# Patient Record
Sex: Male | Born: 1935 | Race: White | Hispanic: No | Marital: Married | Smoking: Never smoker
Health system: Southern US, Community
[De-identification: ages and names within clinical notes are randomized; demographics above are authoritative.]

## PROBLEM LIST (undated history)

## (undated) DIAGNOSIS — I701 Atherosclerosis of renal artery: Secondary | ICD-10-CM

## (undated) DIAGNOSIS — Z9889 Other specified postprocedural states: Secondary | ICD-10-CM

## (undated) DIAGNOSIS — Z951 Presence of aortocoronary bypass graft: Secondary | ICD-10-CM

## (undated) DIAGNOSIS — I359 Nonrheumatic aortic valve disorder, unspecified: Secondary | ICD-10-CM

## (undated) DIAGNOSIS — G4730 Sleep apnea, unspecified: Secondary | ICD-10-CM

## (undated) DIAGNOSIS — I739 Peripheral vascular disease, unspecified: Secondary | ICD-10-CM

## (undated) DIAGNOSIS — E785 Hyperlipidemia, unspecified: Secondary | ICD-10-CM

## (undated) DIAGNOSIS — I252 Old myocardial infarction: Secondary | ICD-10-CM

## (undated) DIAGNOSIS — D0359 Melanoma in situ of other part of trunk: Secondary | ICD-10-CM

## (undated) DIAGNOSIS — I679 Cerebrovascular disease, unspecified: Secondary | ICD-10-CM

## (undated) DIAGNOSIS — I779 Disorder of arteries and arterioles, unspecified: Secondary | ICD-10-CM

## (undated) DIAGNOSIS — Z9289 Personal history of other medical treatment: Secondary | ICD-10-CM

## (undated) DIAGNOSIS — Z9861 Coronary angioplasty status: Secondary | ICD-10-CM

## (undated) DIAGNOSIS — N400 Enlarged prostate without lower urinary tract symptoms: Secondary | ICD-10-CM

## (undated) DIAGNOSIS — A389 Scarlet fever, uncomplicated: Secondary | ICD-10-CM

## (undated) DIAGNOSIS — C679 Malignant neoplasm of bladder, unspecified: Secondary | ICD-10-CM

## (undated) DIAGNOSIS — I2510 Atherosclerotic heart disease of native coronary artery without angina pectoris: Secondary | ICD-10-CM

## (undated) HISTORY — DX: Atherosclerosis of renal artery: I70.1

## (undated) HISTORY — DX: Nonrheumatic aortic valve disorder, unspecified: I35.9

## (undated) HISTORY — DX: Hyperlipidemia, unspecified: E78.5

## (undated) HISTORY — DX: Coronary angioplasty status: Z98.61

## (undated) HISTORY — DX: Peripheral vascular disease, unspecified: I73.9

## (undated) HISTORY — DX: Melanoma in situ of other part of trunk: D03.59

## (undated) HISTORY — PX: CYSTOSCOPY: SHX5120

## (undated) HISTORY — PX: OTHER SURGICAL HISTORY: SHX169

## (undated) HISTORY — DX: Cerebrovascular disease, unspecified: I67.9

## (undated) HISTORY — DX: Disorder of arteries and arterioles, unspecified: I77.9

## (undated) HISTORY — DX: Sleep apnea, unspecified: G47.30

## (undated) HISTORY — PX: CORONARY ARTERY BYPASS GRAFT: SHX141

## (undated) HISTORY — DX: Personal history of other medical treatment: Z92.89

## (undated) HISTORY — DX: Presence of aortocoronary bypass graft: Z95.1

## (undated) HISTORY — DX: Other specified postprocedural states: Z98.890

## (undated) HISTORY — DX: Old myocardial infarction: I25.2

## (undated) HISTORY — DX: Atherosclerotic heart disease of native coronary artery without angina pectoris: I25.10

## (undated) HISTORY — DX: Benign prostatic hyperplasia without lower urinary tract symptoms: N40.0

## (undated) HISTORY — DX: Scarlet fever, uncomplicated: A38.9

## (undated) HISTORY — DX: Malignant neoplasm of bladder, unspecified: C67.9

---

## 2000-10-30 ENCOUNTER — Encounter: Payer: Self-pay | Admitting: Urology

## 2000-10-31 ENCOUNTER — Encounter (INDEPENDENT_AMBULATORY_CARE_PROVIDER_SITE_OTHER): Payer: Self-pay | Admitting: Specialist

## 2000-10-31 ENCOUNTER — Ambulatory Visit (HOSPITAL_COMMUNITY): Admission: RE | Admit: 2000-10-31 | Discharge: 2000-10-31 | Payer: Self-pay | Admitting: Urology

## 2001-03-06 ENCOUNTER — Ambulatory Visit (HOSPITAL_COMMUNITY): Admission: RE | Admit: 2001-03-06 | Discharge: 2001-03-06 | Payer: Self-pay | Admitting: Urology

## 2001-03-06 ENCOUNTER — Encounter (INDEPENDENT_AMBULATORY_CARE_PROVIDER_SITE_OTHER): Payer: Self-pay | Admitting: Specialist

## 2001-07-23 ENCOUNTER — Ambulatory Visit (HOSPITAL_COMMUNITY): Admission: RE | Admit: 2001-07-23 | Discharge: 2001-07-23 | Payer: Self-pay | Admitting: Gastroenterology

## 2001-07-23 ENCOUNTER — Encounter (INDEPENDENT_AMBULATORY_CARE_PROVIDER_SITE_OTHER): Payer: Self-pay | Admitting: Specialist

## 2003-07-03 ENCOUNTER — Encounter: Admission: RE | Admit: 2003-07-03 | Discharge: 2003-07-03 | Payer: Self-pay | Admitting: Cardiology

## 2003-07-28 ENCOUNTER — Ambulatory Visit (HOSPITAL_COMMUNITY): Admission: RE | Admit: 2003-07-28 | Discharge: 2003-07-28 | Payer: Self-pay | Admitting: Cardiology

## 2003-08-05 ENCOUNTER — Ambulatory Visit (HOSPITAL_COMMUNITY): Admission: RE | Admit: 2003-08-05 | Discharge: 2003-08-06 | Payer: Self-pay | Admitting: Cardiology

## 2004-05-26 ENCOUNTER — Ambulatory Visit: Payer: Self-pay | Admitting: Internal Medicine

## 2004-07-15 ENCOUNTER — Ambulatory Visit: Payer: Self-pay | Admitting: Internal Medicine

## 2004-07-20 ENCOUNTER — Ambulatory Visit: Payer: Self-pay | Admitting: Internal Medicine

## 2004-08-08 ENCOUNTER — Ambulatory Visit: Payer: Self-pay

## 2004-08-09 ENCOUNTER — Ambulatory Visit: Payer: Self-pay | Admitting: Cardiology

## 2005-01-18 ENCOUNTER — Ambulatory Visit: Payer: Self-pay | Admitting: Cardiology

## 2005-02-06 ENCOUNTER — Ambulatory Visit: Payer: Self-pay

## 2005-02-06 ENCOUNTER — Ambulatory Visit: Payer: Self-pay | Admitting: Cardiology

## 2005-02-07 ENCOUNTER — Ambulatory Visit: Payer: Self-pay | Admitting: Internal Medicine

## 2005-05-02 ENCOUNTER — Ambulatory Visit: Payer: Self-pay | Admitting: Internal Medicine

## 2005-05-04 ENCOUNTER — Ambulatory Visit: Payer: Self-pay | Admitting: Cardiology

## 2005-08-28 ENCOUNTER — Ambulatory Visit: Payer: Self-pay | Admitting: Internal Medicine

## 2005-09-04 ENCOUNTER — Ambulatory Visit: Payer: Self-pay | Admitting: Internal Medicine

## 2005-09-08 ENCOUNTER — Ambulatory Visit: Payer: Self-pay

## 2005-09-25 ENCOUNTER — Ambulatory Visit: Payer: Self-pay | Admitting: Internal Medicine

## 2005-10-24 ENCOUNTER — Ambulatory Visit: Payer: Self-pay | Admitting: Cardiology

## 2005-11-10 ENCOUNTER — Ambulatory Visit: Payer: Self-pay

## 2006-05-02 ENCOUNTER — Ambulatory Visit: Payer: Self-pay | Admitting: Internal Medicine

## 2006-05-24 ENCOUNTER — Ambulatory Visit: Payer: Self-pay

## 2006-09-11 ENCOUNTER — Ambulatory Visit: Payer: Self-pay | Admitting: Internal Medicine

## 2006-09-11 LAB — CONVERTED CEMR LAB
AST: 20 units/L (ref 0–37)
Cholesterol: 131 mg/dL (ref 0–200)
HDL: 46.1 mg/dL (ref >39.0)
LDL Cholesterol: 68 mg/dL (ref 0–99)
PSA: 2.18 ng/mL (ref 0.10–4.00)
Total CHOL/HDL Ratio: 2.8
VLDL: 17 mg/dL (ref 0–40)

## 2006-10-18 ENCOUNTER — Ambulatory Visit (HOSPITAL_BASED_OUTPATIENT_CLINIC_OR_DEPARTMENT_OTHER): Admission: RE | Admit: 2006-10-18 | Discharge: 2006-10-18 | Payer: Self-pay | Admitting: Internal Medicine

## 2006-10-26 ENCOUNTER — Ambulatory Visit: Payer: Self-pay | Admitting: Cardiology

## 2006-11-02 ENCOUNTER — Ambulatory Visit: Payer: Self-pay | Admitting: Pulmonary Disease

## 2006-11-07 ENCOUNTER — Ambulatory Visit: Payer: Self-pay

## 2006-12-05 ENCOUNTER — Ambulatory Visit: Payer: Self-pay | Admitting: Pulmonary Disease

## 2006-12-18 ENCOUNTER — Ambulatory Visit: Payer: Self-pay | Admitting: Pulmonary Disease

## 2006-12-18 ENCOUNTER — Ambulatory Visit (HOSPITAL_BASED_OUTPATIENT_CLINIC_OR_DEPARTMENT_OTHER): Admission: RE | Admit: 2006-12-18 | Discharge: 2006-12-18 | Payer: Self-pay | Admitting: Pulmonary Disease

## 2007-01-02 ENCOUNTER — Ambulatory Visit: Payer: Self-pay | Admitting: Internal Medicine

## 2007-03-26 ENCOUNTER — Ambulatory Visit: Payer: Self-pay | Admitting: Pulmonary Disease

## 2007-04-30 ENCOUNTER — Ambulatory Visit: Payer: Self-pay | Admitting: Internal Medicine

## 2007-05-15 ENCOUNTER — Ambulatory Visit: Payer: Self-pay

## 2007-06-19 ENCOUNTER — Ambulatory Visit: Payer: Self-pay | Admitting: Cardiology

## 2007-06-19 ENCOUNTER — Inpatient Hospital Stay (HOSPITAL_COMMUNITY): Admission: EM | Admit: 2007-06-19 | Discharge: 2007-06-22 | Payer: Self-pay | Admitting: Emergency Medicine

## 2007-07-02 ENCOUNTER — Ambulatory Visit: Payer: Self-pay

## 2007-07-02 ENCOUNTER — Encounter: Payer: Self-pay | Admitting: Cardiology

## 2007-07-08 ENCOUNTER — Ambulatory Visit: Payer: Self-pay | Admitting: Cardiology

## 2007-07-16 ENCOUNTER — Ambulatory Visit: Payer: Self-pay | Admitting: Cardiology

## 2007-07-30 ENCOUNTER — Ambulatory Visit: Payer: Self-pay | Admitting: Cardiology

## 2007-07-30 LAB — CONVERTED CEMR LAB
Basophils Relative: 0.1 % (ref 0.0–1.0)
Eosinophils Absolute: 0.1 10*3/uL (ref 0.0–0.6)
Eosinophils Relative: 2 % (ref 0.0–5.0)
Hemoglobin: 13.1 g/dL (ref 13.0–17.0)
Lymphocytes Relative: 23.9 % (ref 12.0–46.0)
MCV: 89.6 fL (ref 78.0–100.0)
Monocytes Absolute: 0.7 10*3/uL (ref 0.2–0.7)
Neutro Abs: 3.3 10*3/uL (ref 1.4–7.7)
WBC: 5.4 10*3/uL (ref 4.5–10.5)

## 2007-08-27 ENCOUNTER — Encounter: Payer: Self-pay | Admitting: *Deleted

## 2007-08-27 DIAGNOSIS — I679 Cerebrovascular disease, unspecified: Secondary | ICD-10-CM | POA: Insufficient documentation

## 2007-08-27 DIAGNOSIS — Z951 Presence of aortocoronary bypass graft: Secondary | ICD-10-CM | POA: Insufficient documentation

## 2007-08-27 DIAGNOSIS — I252 Old myocardial infarction: Secondary | ICD-10-CM | POA: Insufficient documentation

## 2007-08-27 DIAGNOSIS — I739 Peripheral vascular disease, unspecified: Secondary | ICD-10-CM | POA: Insufficient documentation

## 2007-08-27 DIAGNOSIS — E785 Hyperlipidemia, unspecified: Secondary | ICD-10-CM | POA: Insufficient documentation

## 2007-08-27 DIAGNOSIS — I25709 Atherosclerosis of coronary artery bypass graft(s), unspecified, with unspecified angina pectoris: Secondary | ICD-10-CM | POA: Insufficient documentation

## 2007-08-27 DIAGNOSIS — G4730 Sleep apnea, unspecified: Secondary | ICD-10-CM | POA: Insufficient documentation

## 2007-08-27 DIAGNOSIS — N400 Enlarged prostate without lower urinary tract symptoms: Secondary | ICD-10-CM | POA: Insufficient documentation

## 2007-08-27 DIAGNOSIS — C679 Malignant neoplasm of bladder, unspecified: Secondary | ICD-10-CM | POA: Insufficient documentation

## 2007-08-27 DIAGNOSIS — I2510 Atherosclerotic heart disease of native coronary artery without angina pectoris: Secondary | ICD-10-CM | POA: Insufficient documentation

## 2007-08-27 DIAGNOSIS — I701 Atherosclerosis of renal artery: Secondary | ICD-10-CM | POA: Insufficient documentation

## 2007-08-27 DIAGNOSIS — Z9861 Coronary angioplasty status: Secondary | ICD-10-CM | POA: Insufficient documentation

## 2007-08-27 DIAGNOSIS — Z87898 Personal history of other specified conditions: Secondary | ICD-10-CM | POA: Insufficient documentation

## 2007-08-27 DIAGNOSIS — A389 Scarlet fever, uncomplicated: Secondary | ICD-10-CM | POA: Insufficient documentation

## 2007-10-09 ENCOUNTER — Ambulatory Visit: Payer: Self-pay | Admitting: Cardiology

## 2007-10-22 ENCOUNTER — Ambulatory Visit: Payer: Self-pay | Admitting: Cardiology

## 2007-11-26 ENCOUNTER — Ambulatory Visit: Payer: Self-pay | Admitting: Internal Medicine

## 2008-02-06 ENCOUNTER — Ambulatory Visit: Payer: Self-pay | Admitting: Cardiology

## 2008-05-04 ENCOUNTER — Ambulatory Visit: Payer: Self-pay | Admitting: Internal Medicine

## 2008-05-12 ENCOUNTER — Ambulatory Visit: Payer: Self-pay | Admitting: Cardiology

## 2008-05-14 ENCOUNTER — Ambulatory Visit: Payer: Self-pay | Admitting: Cardiology

## 2008-06-09 ENCOUNTER — Ambulatory Visit: Payer: Self-pay | Admitting: Cardiology

## 2008-10-14 ENCOUNTER — Telehealth: Payer: Self-pay | Admitting: Internal Medicine

## 2008-10-15 ENCOUNTER — Ambulatory Visit: Payer: Self-pay | Admitting: Cardiology

## 2008-10-15 ENCOUNTER — Encounter: Payer: Self-pay | Admitting: Cardiology

## 2008-11-13 ENCOUNTER — Ambulatory Visit: Payer: Self-pay

## 2008-11-13 ENCOUNTER — Encounter: Payer: Self-pay | Admitting: Cardiology

## 2008-12-08 ENCOUNTER — Ambulatory Visit: Payer: Self-pay | Admitting: Cardiology

## 2008-12-11 DIAGNOSIS — I350 Nonrheumatic aortic (valve) stenosis: Secondary | ICD-10-CM | POA: Insufficient documentation

## 2008-12-21 ENCOUNTER — Encounter: Payer: Self-pay | Admitting: Cardiology

## 2009-01-05 ENCOUNTER — Ambulatory Visit: Payer: Self-pay | Admitting: Internal Medicine

## 2009-01-08 ENCOUNTER — Ambulatory Visit: Payer: Self-pay | Admitting: Internal Medicine

## 2009-01-08 LAB — CONVERTED CEMR LAB
ALT: 19 units/L (ref 0–53)
Albumin: 3.7 g/dL (ref 3.5–5.2)
BUN: 30 mg/dL — ABNORMAL HIGH (ref 6–23)
Basophils Relative: 0 % (ref 0.0–3.0)
Bilirubin, Direct: 0.2 mg/dL (ref 0.0–0.3)
Calcium: 9.2 mg/dL (ref 8.4–10.5)
Cholesterol: 101 mg/dL (ref 0–200)
Creatinine, Ser: 1.7 mg/dL — ABNORMAL HIGH (ref 0.4–1.5)
Eosinophils Relative: 1.2 % (ref 0.0–5.0)
GFR calc non Af Amer: 41.66 mL/min (ref >60)
Glucose, Bld: 101 mg/dL — ABNORMAL HIGH (ref 70–99)
HCT: 40.5 % (ref 39.0–52.0)
Hemoglobin: 13.8 g/dL (ref 13.0–17.0)
MCV: 91.4 fL (ref 78.0–100.0)
Monocytes Absolute: 0.5 10*3/uL (ref 0.1–1.0)
Neutro Abs: 3.2 10*3/uL (ref 1.4–7.7)
Neutrophils Relative %: 66.2 % (ref 43.0–77.0)
PSA: 1.65 ng/mL (ref 0.10–4.00)
RBC: 4.43 M/uL (ref 4.22–5.81)
Total Protein: 6.1 g/dL (ref 6.0–8.3)
WBC: 4.9 10*3/uL (ref 4.5–10.5)

## 2009-04-07 ENCOUNTER — Encounter: Payer: Self-pay | Admitting: Internal Medicine

## 2009-04-14 ENCOUNTER — Ambulatory Visit: Payer: Self-pay | Admitting: Internal Medicine

## 2009-05-18 ENCOUNTER — Encounter: Payer: Self-pay | Admitting: Cardiology

## 2009-05-19 ENCOUNTER — Encounter: Payer: Self-pay | Admitting: Cardiology

## 2009-05-19 ENCOUNTER — Ambulatory Visit: Payer: Self-pay

## 2009-06-15 ENCOUNTER — Ambulatory Visit: Payer: Self-pay | Admitting: Cardiology

## 2009-06-15 ENCOUNTER — Encounter: Payer: Self-pay | Admitting: Cardiology

## 2009-06-15 ENCOUNTER — Encounter: Payer: Self-pay | Admitting: Internal Medicine

## 2009-06-18 ENCOUNTER — Encounter (INDEPENDENT_AMBULATORY_CARE_PROVIDER_SITE_OTHER): Payer: Self-pay | Admitting: *Deleted

## 2009-08-03 ENCOUNTER — Telehealth (INDEPENDENT_AMBULATORY_CARE_PROVIDER_SITE_OTHER): Payer: Self-pay | Admitting: *Deleted

## 2009-08-10 ENCOUNTER — Encounter: Payer: Self-pay | Admitting: Cardiology

## 2009-08-11 ENCOUNTER — Encounter: Payer: Self-pay | Admitting: Cardiology

## 2009-08-11 ENCOUNTER — Ambulatory Visit: Payer: Self-pay | Admitting: Cardiology

## 2009-08-13 ENCOUNTER — Telehealth: Payer: Self-pay | Admitting: Cardiology

## 2009-08-17 ENCOUNTER — Encounter: Payer: Self-pay | Admitting: Cardiology

## 2009-10-12 ENCOUNTER — Encounter (INDEPENDENT_AMBULATORY_CARE_PROVIDER_SITE_OTHER): Payer: Self-pay | Admitting: *Deleted

## 2009-10-15 ENCOUNTER — Ambulatory Visit: Payer: Self-pay | Admitting: Cardiology

## 2009-10-15 DIAGNOSIS — I4510 Unspecified right bundle-branch block: Secondary | ICD-10-CM | POA: Insufficient documentation

## 2009-11-03 ENCOUNTER — Telehealth (INDEPENDENT_AMBULATORY_CARE_PROVIDER_SITE_OTHER): Payer: Self-pay

## 2009-11-04 ENCOUNTER — Ambulatory Visit (HOSPITAL_COMMUNITY): Admission: RE | Admit: 2009-11-04 | Discharge: 2009-11-04 | Payer: Self-pay | Admitting: Cardiology

## 2009-11-04 ENCOUNTER — Encounter: Payer: Self-pay | Admitting: Cardiology

## 2009-11-04 ENCOUNTER — Ambulatory Visit: Payer: Self-pay | Admitting: Cardiovascular Disease

## 2009-11-04 ENCOUNTER — Ambulatory Visit: Payer: Self-pay

## 2009-11-04 ENCOUNTER — Encounter (HOSPITAL_COMMUNITY): Admission: RE | Admit: 2009-11-04 | Discharge: 2010-01-18 | Payer: Self-pay | Admitting: Cardiology

## 2009-11-04 ENCOUNTER — Ambulatory Visit: Payer: Self-pay | Admitting: Internal Medicine

## 2009-11-11 ENCOUNTER — Ambulatory Visit: Payer: Self-pay | Admitting: Cardiology

## 2010-01-06 ENCOUNTER — Ambulatory Visit: Payer: Self-pay | Admitting: Internal Medicine

## 2010-03-09 ENCOUNTER — Ambulatory Visit: Payer: Self-pay | Admitting: Cardiology

## 2010-03-11 ENCOUNTER — Ambulatory Visit: Payer: Self-pay | Admitting: Cardiology

## 2010-03-11 LAB — CONVERTED CEMR LAB
Basophils Absolute: 0 10*3/uL (ref 0.0–0.1)
Calcium: 8.9 mg/dL (ref 8.4–10.5)
GFR calc non Af Amer: 46.21 mL/min (ref >60)
HCT: 37.3 % — ABNORMAL LOW (ref 39.0–52.0)
INR: 1 (ref 0.8–1.0)
Lymphs Abs: 1.2 10*3/uL (ref 0.7–4.0)
MCV: 91.5 fL (ref 78.0–100.0)
Monocytes Absolute: 0.5 10*3/uL (ref 0.1–1.0)
Monocytes Relative: 9.2 % (ref 3.0–12.0)
Platelets: 136 10*3/uL — ABNORMAL LOW (ref 150.0–400.0)
RDW: 14.8 % — ABNORMAL HIGH (ref 11.5–14.6)
Sodium: 144 meq/L (ref 135–145)
aPTT: 24.4 s (ref 21.7–28.8)

## 2010-03-14 ENCOUNTER — Ambulatory Visit: Payer: Self-pay | Admitting: Internal Medicine

## 2010-03-14 ENCOUNTER — Inpatient Hospital Stay (HOSPITAL_BASED_OUTPATIENT_CLINIC_OR_DEPARTMENT_OTHER): Admission: RE | Admit: 2010-03-14 | Discharge: 2010-03-14 | Payer: Self-pay | Admitting: Internal Medicine

## 2010-03-14 LAB — CONVERTED CEMR LAB
Calcium: 8.7 mg/dL (ref 8.4–10.5)
Creatinine, Ser: 1.6 mg/dL — ABNORMAL HIGH (ref 0.4–1.5)

## 2010-03-22 ENCOUNTER — Ambulatory Visit: Payer: Self-pay | Admitting: Surgery

## 2010-03-22 ENCOUNTER — Encounter: Payer: Self-pay | Admitting: Internal Medicine

## 2010-03-23 ENCOUNTER — Encounter: Payer: Self-pay | Admitting: Internal Medicine

## 2010-03-23 ENCOUNTER — Encounter: Payer: Self-pay | Admitting: Cardiology

## 2010-04-05 ENCOUNTER — Ambulatory Visit: Payer: Self-pay | Admitting: Cardiology

## 2010-04-07 ENCOUNTER — Ambulatory Visit: Payer: Self-pay | Admitting: Internal Medicine

## 2010-04-11 ENCOUNTER — Telehealth: Payer: Self-pay | Admitting: Cardiology

## 2010-04-14 ENCOUNTER — Ambulatory Visit: Payer: Self-pay | Admitting: Surgery

## 2010-04-14 ENCOUNTER — Ambulatory Visit: Admission: RE | Admit: 2010-04-14 | Discharge: 2010-04-14 | Payer: Self-pay | Admitting: Surgery

## 2010-04-14 ENCOUNTER — Encounter: Payer: Self-pay | Admitting: Surgery

## 2010-04-18 ENCOUNTER — Encounter: Payer: Self-pay | Admitting: Surgery

## 2010-04-18 ENCOUNTER — Ambulatory Visit: Payer: Self-pay | Admitting: Surgery

## 2010-04-18 ENCOUNTER — Inpatient Hospital Stay (HOSPITAL_COMMUNITY): Admission: RE | Admit: 2010-04-18 | Discharge: 2010-04-25 | Payer: Self-pay | Admitting: Surgery

## 2010-04-27 ENCOUNTER — Encounter: Payer: Self-pay | Admitting: Cardiology

## 2010-05-03 ENCOUNTER — Encounter: Admission: RE | Admit: 2010-05-03 | Discharge: 2010-05-03 | Payer: Self-pay | Admitting: Surgery

## 2010-05-03 ENCOUNTER — Ambulatory Visit: Payer: Self-pay | Admitting: Surgery

## 2010-05-17 ENCOUNTER — Ambulatory Visit: Payer: Self-pay | Admitting: Surgery

## 2010-05-17 ENCOUNTER — Encounter: Admission: RE | Admit: 2010-05-17 | Discharge: 2010-05-17 | Payer: Self-pay | Admitting: Surgery

## 2010-05-17 ENCOUNTER — Encounter: Payer: Self-pay | Admitting: Internal Medicine

## 2010-05-20 ENCOUNTER — Ambulatory Visit: Payer: Self-pay | Admitting: Cardiology

## 2010-06-08 ENCOUNTER — Ambulatory Visit: Payer: Self-pay | Admitting: Internal Medicine

## 2010-06-08 ENCOUNTER — Telehealth: Payer: Self-pay | Admitting: Internal Medicine

## 2010-06-08 DIAGNOSIS — M26609 Unspecified temporomandibular joint disorder, unspecified side: Secondary | ICD-10-CM | POA: Insufficient documentation

## 2010-06-16 ENCOUNTER — Encounter (HOSPITAL_COMMUNITY)
Admission: RE | Admit: 2010-06-16 | Discharge: 2010-08-16 | Payer: Self-pay | Source: Home / Self Care | Attending: Cardiology | Admitting: Cardiology

## 2010-06-20 ENCOUNTER — Encounter: Payer: Self-pay | Admitting: Cardiology

## 2010-06-22 ENCOUNTER — Encounter: Payer: Self-pay | Admitting: Internal Medicine

## 2010-06-27 ENCOUNTER — Telehealth (INDEPENDENT_AMBULATORY_CARE_PROVIDER_SITE_OTHER): Payer: Self-pay | Admitting: *Deleted

## 2010-06-29 ENCOUNTER — Encounter: Payer: Self-pay | Admitting: Cardiology

## 2010-07-01 ENCOUNTER — Telehealth: Payer: Self-pay | Admitting: Internal Medicine

## 2010-07-08 ENCOUNTER — Encounter: Payer: Self-pay | Admitting: Cardiology

## 2010-07-17 DIAGNOSIS — D0359 Melanoma in situ of other part of trunk: Secondary | ICD-10-CM

## 2010-07-17 HISTORY — DX: Melanoma in situ of other part of trunk: D03.59

## 2010-07-17 HISTORY — PX: MELANOMA EXCISION: SHX5266

## 2010-07-20 ENCOUNTER — Telehealth (INDEPENDENT_AMBULATORY_CARE_PROVIDER_SITE_OTHER): Payer: Self-pay | Admitting: *Deleted

## 2010-08-16 NOTE — Miscellaneous (Signed)
Summary: Request for Lipid Profile/[place]  Request for Lipid Profile/[place]   Imported By: Sherian Rein 06/24/2010 11:45:52  _____________________________________________________________________  External Attachment:    Type:   Image     Comment:   External Document

## 2010-08-16 NOTE — Letter (Signed)
Summary: [facility]  [facility]   Imported By: Lester Kildare 03/30/2010 07:19:21  _____________________________________________________________________  External Attachment:    Type:   Image     Comment:   External Document

## 2010-08-16 NOTE — Consult Note (Signed)
Summary: [facility] New Patient Consult   [facility] Office Visit   Imported By: Roderic Ovens 05/03/2010 11:14:58  _____________________________________________________________________  External Attachment:    Type:   Image     Comment:   External Document

## 2010-08-16 NOTE — Progress Notes (Signed)
   Walk in Patient Form Recieved " Insurance no longer paying for Vytorin, Pt left [facility] paper" forward to Wall/[facility]  August 03, 2009 3:04 PM

## 2010-08-16 NOTE — Miscellaneous (Signed)
Summary: [facility] Physician Order/Treatment Plan   [facility] Physician Order/Treatment Plan   Imported By: Roderic Ovens 05/16/2010 10:47:17  _____________________________________________________________________  External Attachment:    Type:   Image     Comment:   External Document

## 2010-08-16 NOTE — Letter (Signed)
Summary: Cardiac Catheterization Instructions- [facility]  [employer], Main Office  [address]   [place], [place] [address]   Phone: [phone]  Fax: [phone]     03/09/2010 MRN: [id number]  [name] [address] [place], [place]  [address]  Dear [name], [name] are scheduled for a Cardiac Catheterization on Monday August 29,2011 with Dr. Gala Romney. Please arrive to the 1st floor of the Heart and Vascular Center at [facility] at 8:30 am on the day of your procedure. Please do not arrive before 6:30 a.m. Call the Heart and Vascular Center at [phone] if you are unable to make your appointmnet. The Code to get into the parking garage under the building is 0002. Take the elevators to the 1st floor. You must have someone to drive you home. Someone must be with you for the first 24 hours after you arrive home. Please wear clothes that are easy to get on and off and wear slip-on shoes. Do not eat or drink after midnight except water with your medications that morning. Bring all your medications and current insurance cards with you.   __x_ Make sure you take your aspirin.  ___ You may take ALL of your medications with water that morning.   The usual length of stay after your procedure is 2 to 3 hours. This can vary.  If you have any questions, please call the office at the number listed above.   Dr. Karie Schwalbe. Wall/ Lisabeth Devoid RN

## 2010-08-16 NOTE — Medication Information (Signed)
Summary: Drug Quantity Limitations Request Form  Drug Quantity Limitations Request Form   Imported By: Roderic Ovens 08/13/2009 11:14:05  _____________________________________________________________________  External Attachment:    Type:   Image     Comment:   External Document

## 2010-08-16 NOTE — Cardiovascular Report (Signed)
Summary: Pre Cath Orders   Pre Cath Orders   Imported By: Roderic Ovens 03/14/2010 12:50:14  _____________________________________________________________________  External Attachment:    Type:   Image     Comment:   External Document

## 2010-08-16 NOTE — Progress Notes (Signed)
Summary: heart surgery is Monday   Phone Note Call from Patient Call back at Home Phone [phone]   Caller: pt Reason for Call: Talk to Nurse Summary of Call: pt is scheduled for heart surgery Monday Oct 3rd. Dr Daleen Squibb wanted them to call and let him know Initial call taken by: Faythe Ghee,  April 11, 2010 1:19 PM  Follow-up for Phone Call        I will forward to Dr. Daleen Squibb.  Mr. Reid wanted to make sure Dr. Daleen Squibb was aware of his surgical date with Dr. Laneta Simmers. Mylo Red RN     Appended Document: heart surgery is Monday ok. i WILL BE ON VACATION.

## 2010-08-16 NOTE — Assessment & Plan Note (Signed)
Summary: eph/ d/c on 10.10.11  Medications Added DOXAZOSIN MESYLATE 4 MG  TABS (DOXAZOSIN MESYLATE) 1/2 by mouth two times a day AMOXICILLIN 500 MG CAPS (AMOXICILLIN) take 4 capsules 1 hour before your dental procedure      Allergies Added:   Primary Provider:  Jacques Navy MD   History of Present Illness: [name] returns today after having aortic valve replacement with a pericardial tissue valve on October 3. He did not require repeat coronary bypass grafting. Left ventricular function prior to surgery was normal.  He did well postoperatively. He is followed with Dr. Laneta Delacruz who has released him.  He is anxious to start back in cardiac rehabilitation.  He will have dental surgery in January. At that time we will proceed with antibiotic prophylaxis for endocarditis until the valve endothelializes.  He denies orthopnea, PND, edema, productive cough, shortness of breath  Clinical Reports Reviewed:  Cardiac Cath:  03/14/2010: Cardiac Cath Findings:  Assessment: 1. Coronary artery disease with all grafts patent. 2. Normal Left ventricular function with a highly calcified aortic valve. 3. Moderate aortic stenosis by catherterization. Severe by echo. 4. Peripheral arterial disease with apparent high-grade right renal artery stenosis.  Ricky Meres, MD  06/20/2007: Cardiac Cath Findings:   ANGIOGRAPHIC DATA:  The vein graft prior to intervention demonstrated   90% stenosis and afterwards 0%.  No complications were noted.      DISPOSITION:  The patient will be treated with aspirin and Plavix,   continue to follow up with Dr. Daleen Delacruz with risk factor reduction.            Ricky Morton. Riley Kill, MD, [facility]   Electronically Signed     Cardiac Cath Findings:   Left ventriculogram done in the RAO position showed an ejection fraction   of 65-70% with no [name] motion abnormalities.  No significant mitral   regurgitation.      ASSESSMENT:   1. Three vessel native coronary artery  disease.   2. LIMA to the LAD is patent.  However, the distal LAD fills mainly       through the native vessel.   3. Saphenous vein graft to the diagonal is okay.  Saphenous vein graft       to the PDA with a questionable sequential graft to the posterior       lateral is okay.  However, it is unclear if the distal limb is       patent or not, or whether or not this is native vessel.   4. Saphenous vein graft to the OM has a ruptured plaque with a 95%       stenosis proximally.   5. Normal LV function.   6. Question of mild aortic stenosis on pullback across the aortic       valve.      PLAN:  Percutaneous intervention from the saphenous vein graft to the OM   with Dr. Riley Delacruz.  Would also check a 2D echocardiogram to evaluate the   aortic valve.               Ricky Buckles. Bensimhon, MD   Electronically Signed      08/25/1993: Cardiac Cath Findings:  Final Diagnosis: 1) Well preserved left ventricular function 2) Multivessel coronary artery disease.  Ricky Pons, MD  CXR:  06/19/2007: CXR Results:   Clinical Data:   Chest pain, hypertension.  Previous smoker.   PORTABLE CHEST - 06/19/2007:   Comparison:  07/28/2003   Findings:  Sternal wire sutures and mediastinal clips (CABG).  No CHF   or acute lung process. Cardiomediastinal silhouette appears   unremarkable.   IMPRESSION:   No acute chest findings.    Read By:  [name],  M.D.   Released By:  [name],  M.D.  07/28/2003: CXR Results:  DG Chest 1V (Same Day)  Clinical Data:  PVD, preoperative evaluation.  Previous chest x-ray   showed nodular densities at the bases.  Now for repeat with nipple   markers.   CHEST, ONE VIEW SAME DAY   The patient returned and a single view of the chest with nipple   markers was obtained.  The nodular densities over both lung bases do   correspond to the nipple shadows.   The patient is status-post CABG.  No focal opacities or effusions.   IMPRESSION   No  acute disease.  Previously described nodular densities do   represent nipple shadows.    Read By:  [name],  M.D.   Released By:  [name],  M.D. CXR Results:  DG Chest 2 View  Clinical Data:    Preop respiratory exam prior to angiogram for   peripheral vascular disease.   CHEST X-RAY (TWO VIEWS)   Two views of the chest show the lungs to be clear.  Nodular opacities   at the lung bases are typical of nipple shadows but repeat view with   nipple markers may be helpful.  The heart is within upper limits of   normal.  Median sternotomy sutures are noted from prior CABG.   IMPRESSION   No active lung disease.  Probable nipple shadows at the bases.   Suggest repeat PA view with nipple markers.    Read By:  [name],  M.D.   Released By:  [name],  M.D.  Carotid Doppler:  11/04/2009:  Impressions: Stable, moderate carotid artery disease, bilaterally. 60-79% bilateral ICA Stenoses.  Ricky Haws, MD  10/26/2009:  Impressions: Stable, moderate carotid artery disease, bilaterally. 60-79% bilateral ICA stenosis.  Ricky Haws, MD  05/19/2009:  Impressions: Essentially stable, moderate carotid artery disease, bilaterally. 60-79% bilateral ICA stenoses.  Ricky Haws, MD  05/14/2008:  Impressions: Stable, bilateral carotid disease Elevated velocities in the right subclavian artery. 60-79% RICA stenosis 40-59% LICA stenosis  [name]  05/14/2007:  Impressions: Essentially stable carotid artery disease, bilaterally 60-79% RICA stenosis 40-59% LICA stenosis  Ricky Bollman, MD   Current Medications (verified): 1)  Aspirin Ec 325 Mg  Tbec (Aspirin) .... Take 1 Tablet By Mouth Once A Day 2)  Altace 5 Mg  Caps (Ramipril) .... Take One Capsule Once Daily 3)  Doxazosin Mesylate 4 Mg  Tabs (Doxazosin Mesylate) .... 1/2 By Mouth Two Times A Day 4)  Vitamin C 500 Mg  Tabs (Ascorbic Acid) .... Take 1 Tablet By Mouth Two Times A Day 5)  Calcium 600 Mg   Tabs (Calcium) .Marland Kitchen.. 1 Once Daily 6)  Vytorin 10-40 Mg  Tabs (Ezetimibe-Simvastatin) .... Take One Tablet Once Daily 7)  Multivitamins   Tabs (Multiple Vitamin) .... Take One Tablet Once Daily 8)  Fish Oil 1000 Mg  Caps (Omega-3 Fatty Acids) .Marland Kitchen.. 1 Cap Three Times A Day 9)  Zyrtec Allergy 10 Mg Tabs (Cetirizine Hcl) .... 1/2 Tab Two Times A Day 10)  Plavix 75 Mg  Tabs (Clopidogrel Bisulfate) .... Take One Tablet Once Daily 11)  Metoprolol Tartrate 50 Mg Tabs (Metoprolol Tartrate) .... Take One Half Tablet  Twice A Day 12)  Nitrostat 0.4 Mg Subl (Nitroglycerin) .Marland Kitchen.. 1 Tablet Under Tongue At Onset of Chest Pain; You May Repeat Every 5 Minutes For Up To 3 Doses.  Allergies (verified): 1)  ! 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 good marriage.  Risk Factors: Alcohol Use: <1 (01/05/2009) Caffeine Use: 2 cups (01/05/2009) Diet: heart healthy (01/05/2009) Exercise: yes (01/05/2009)  Risk Factors: Smoking Status: quit (01/05/2009)  Review of Systems       negative history of present illness  Vital Signs:  Patient profile:   77 year old male Height:      66 inches Weight:      152 pounds BMI:     24.62 Pulse rate:   60 / minute Resp:     16 per minute BP sitting:   117 / 61  (right arm)  Vitals Entered By: Marrion Coy, CNA (May 20, 2010 3:22 PM)  Physical Exam  General:  Well developed, well nourished, in no acute distress. Head:  normocephalic and atraumatic Eyes:  PERRLA/EOM intact; conjunctiva and lids normal. Neck:  Neck supple, no JVD. No masses, thyromegaly or abnormal cervical nodes. Chest Aleynah Rocchio:  median sternotomy healing nicely. Lungs:  Clear bilaterally to auscultation and percussion. Heart:  PMI nondisplaced, normal S1-S2, soft systolic murmur, soft carotid bruits Msk:  Back normal, normal gait. Muscle strength and tone normal. Pulses:  pulses normal in all 4 extremities Extremities:  No clubbing or cyanosis. Neurologic:  Alert and oriented x 3. Skin:  Intact without lesions or rashes. Psych:  Normal affect.   Impression & Recommendations:  Problem # 1:  AORTIC STENOSIS (ICD-424.1) Assessment Improved status post replacement with a pericardial valve. Ready for cardiac rehabilitation. His updated medication list for this problem includes:    Altace 5 Mg Caps (Ramipril) .Marland Kitchen... Take one capsule once daily    Metoprolol Tartrate 50 Mg Tabs (Metoprolol tartrate) .Marland Kitchen... Take one half tablet twice a day    Nitrostat 0.4 Mg Subl (Nitroglycerin) .Marland Kitchen... 1 tablet under  tongue at onset of chest pain; you may repeat every 5 minutes for up to 3 doses.  Problem # 2:  CORONARY ARTERY BYPASS GRAFT, HX OF (ICD-V45.81) Assessment: Unchanged  Orders: Cardiac Rehabilitation (Cardiac Rehab)  Problem # 3:  MYOCARDIAL INFARCTION, HX OF (ICD-412) Assessment: Unchanged  His updated medication list for this problem includes:  Aspirin Ec 325 Mg Tbec (Aspirin) .Marland Kitchen... Take 1 tablet by mouth once a day    Altace 5 Mg Caps (Ramipril) .Marland Kitchen... Take one capsule once daily    Plavix 75 Mg Tabs (Clopidogrel bisulfate) .Marland Kitchen... Take one tablet once daily    Metoprolol Tartrate 50 Mg Tabs (Metoprolol tartrate) .Marland Kitchen... Take one half tablet twice a day    Nitrostat 0.4 Mg Subl (Nitroglycerin) .Marland Kitchen... 1 tablet under tongue at onset of chest pain; you may repeat every 5 minutes for up to 3 doses.  Problem # 4:  PERIPHERAL VASCULAR DISEASE (ICD-443.9) Assessment: Unchanged carotid Dopplers on return visit.  Problem # 5:  RBBB (ICD-426.4) Assessment: Unchanged  His updated medication list for this problem includes:    Aspirin Ec 325 Mg Tbec (Aspirin) .Marland Kitchen... Take 1 tablet by mouth once a day    Altace 5 Mg Caps (Ramipril) .Marland Kitchen... Take one capsule once daily    Plavix 75 Mg Tabs (Clopidogrel bisulfate) .Marland Kitchen... Take one tablet once daily    Metoprolol Tartrate 50 Mg Tabs (Metoprolol tartrate) .Marland Kitchen... Take one half tablet twice a day    Nitrostat 0.4 Mg Subl (Nitroglycerin) .Marland Kitchen... 1 tablet under tongue at onset of chest pain; you may repeat every 5 minutes for up to 3 doses.  Patient Instructions: 1)  Your physician recommends that you schedule a follow-up appointment in: 3 months with Dr.  Daleen Delacruz 2)  Your physician recommends that you continue on your current medications as directed. Please refer to the Current Medication list given to you today. 3)  Your physician recommends referral and attendance at a Cardiac Rehab Program. Prescriptions: AMOXICILLIN 500 MG CAPS (AMOXICILLIN) take 4 capsules 1  hour before your dental procedure  #4 x 0   Entered by:   Lisabeth Devoid RN   Authorized by:   Gaylord Shih, MD, [facility]   Signed by:   Lisabeth Devoid RN on 05/20/2010   Method used:   Electronically to        [facility] [address] (retail)       [address]       [place], [place]  [address]       Ph: [phone] or [phone]       Fax: [phone]   RxID:   [id number]

## 2010-08-16 NOTE — Assessment & Plan Note (Signed)
Summary: eph. post cath dr. bensimhon/ gd    Visit Type:  EPH Primary Abdirahman Chittum:  Jacques Navy MD  CC:  pt states he has alot of weakness....pt states he is going to have aortic valve replacement w/Dr. Laneta Simmers no date set yet....sob....denies any edema or cp...pt  bumped his arm 2 wks ago 03/22/10 and now has a large hematoma on his right arm.  History of Present Illness: Mr Cummiskey returns today after having outpatient cardiac catheterization. He has severe calcific aortic stenosis. All of his bypass grafts were open. He has a tight right renal artery stenosis. His creatinine is 1.6 his blood pressure is under good control.  His visit with Dr. Lavinia Sharps. He is waiting to set a surgical date.  He feels very weak and is having more more lack of stamina. He's had no syncope or chest pain.  Clinical Reports Reviewed:  Cardiac Cath:  03/14/2010: Cardiac Cath Findings:  Assessment: 1. Coronary artery disease with all grafts patent. 2. Normal Left ventricular function with a highly calcified aortic valve. 3. Moderate aortic stenosis by catherterization. Severe by echo. 4. Peripheral arterial disease with apparent high-grade right renal artery stenosis.  Arvilla Meres, MD  06/20/2007: Cardiac Cath Findings:   ANGIOGRAPHIC DATA:  The vein graft prior to intervention demonstrated   90% stenosis and afterwards 0%.  No complications were noted.      DISPOSITION:  The patient will be treated with aspirin and Plavix,   continue to follow up with Dr. Daleen Squibb with risk factor reduction.            Arturo Morton. Riley Kill, MD, [facility]   Electronically Signed     Cardiac Cath Findings:   Left ventriculogram done in the RAO position showed an ejection fraction   of 65-70% with no wall motion abnormalities.  No significant mitral   regurgitation.      ASSESSMENT:   1. Three vessel native coronary artery disease.   2. LIMA to the LAD is patent.  However, the distal LAD fills mainly       through the  native vessel.   3. Saphenous vein graft to the diagonal is okay.  Saphenous vein graft       to the PDA with a questionable sequential graft to the posterior       lateral is okay.  However, it is unclear if the distal limb is       patent or not, or whether or not this is native vessel.   4. Saphenous vein graft to the OM has a ruptured plaque with a 95%       stenosis proximally.   5. Normal LV function.   6. Question of mild aortic stenosis on pullback across the aortic       valve.      PLAN:  Percutaneous intervention from the saphenous vein graft to the OM   with Dr. Riley Kill.  Would also check a 2D echocardiogram to evaluate the   aortic valve.               Bevelyn Buckles. Bensimhon, MD   Electronically Signed      08/25/1993: Cardiac Cath Findings:  Final Diagnosis: 1) Well preserved left ventricular function 2) Multivessel coronary artery disease.  Shawnie Pons, MD  Carotid Doppler:  11/04/2009:  Impressions: Stable, moderate carotid artery disease, bilaterally. 60-79% bilateral ICA Stenoses.  Charlton Haws, MD  10/26/2009:  Impressions: Stable, moderate carotid artery disease, bilaterally. 60-79% bilateral ICA stenosis.  Charlton Haws, MD  05/19/2009:  Impressions: Essentially stable, moderate carotid artery disease, bilaterally. 60-79% bilateral ICA stenoses.  Charlton Haws, MD  05/14/2008:  Impressions: Stable, bilateral carotid disease Elevated velocities in the right subclavian artery. 60-79% RICA stenosis 40-59% LICA stenosis  Burna Cash  05/14/2007:  Impressions: Essentially stable carotid artery disease, bilaterally 60-79% RICA stenosis 40-59% LICA stenosis  Tonny Bollman, MD   Current Medications (verified): 1)  Aspirin Ec 325 Mg  Tbec (Aspirin) .... Take 1 Tablet By Mouth Once A Day 2)  Altace 5 Mg  Caps (Ramipril) .... Take One Capsule Once Daily 3)  Doxazosin Mesylate 4 Mg  Tabs (Doxazosin Mesylate) .... Take 1 Tablet By Mouth  Two Times A Day 4)  Vitamin C 500 Mg  Tabs (Ascorbic Acid) .... Take 1 Tablet By Mouth Two Times A Day 5)  Calcium 600 Mg  Tabs (Calcium) .Marland Kitchen.. 1 Once Daily 6)  Vytorin 10-40 Mg  Tabs (Ezetimibe-Simvastatin) .... Take One Tablet Once Daily 7)  Multivitamins   Tabs (Multiple Vitamin) .... Take One Tablet Once Daily 8)  Fish Oil 1000 Mg  Caps (Omega-3 Fatty Acids) .Marland Kitchen.. 1 Cap Three Times A Day 9)  Zyrtec Allergy 10 Mg Tabs (Cetirizine Hcl) .... 1/2 Tab Two Times A Day 10)  Plavix 75 Mg  Tabs (Clopidogrel Bisulfate) .... Take One Tablet Once Daily 11)  Metoprolol Tartrate 50 Mg Tabs (Metoprolol Tartrate) .... Take One Half Tablet Twice A Day 12)  Magnesium Oxide 400 Mg  Tabs (Magnesium Oxide) .... Take One Tablet Daily 13)  Nitrostat 0.4 Mg Subl (Nitroglycerin) .Marland Kitchen.. 1 Tablet Under Tongue At Onset of Chest Pain; You May Repeat Every 5 Minutes For Up To 3 Doses.  Allergies: 1)  ! 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) BENIGN PROSTATIC HYPERTROPHY, HX OF (ICD-V13.8) SLEEP APNEA (ICD-780.57)     Past Surgical History: Last updated: 12/11/2008 * Hx of CYSTOSCOPY WITH BLADDER BIPOSY PERCUTANEOUS TRANSLUMINAL CORONARY ANGIOPLASTY, HX OF (ICD-V45.82) CORONARY ARTERY BYPASS GRAFT, HX OF (ICD-V45.81)  Family History: Last updated: 2007/12/02 father-deceased @86 : CAD/MI w/ sudden death 1 wk post-MI mother-deceased @ 68: HTN, DM, Lipid, cardiomyopathy, CAD Neg- colon  cancer Uncle Prostate cancer  Social History: Last updated: 12-02-2007 Lucita Ferrara, did some graduate work Married '52 1 son - '61; 2 daughters -'60, '63: grandchildren 7; great-grands 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 good marriage.  Risk Factors: Alcohol Use: <1 (01/05/2009) Caffeine Use: 2 cups (01/05/2009) Diet: heart healthy (01/05/2009) Exercise: yes (01/05/2009)  Risk Factors: Smoking Status: quit (01/05/2009)  Review of Systems       negative other than history of present illness  Vital Signs:  Patient profile:   77 year old male Height:      66 inches Weight:      154.4 pounds BMI:     25.01 Pulse rate:   56 / minute Pulse rhythm:   irregular BP sitting:   124 / 70  (left arm) Cuff size:   large  Vitals Entered By: Danielle Rankin, CMA (April 05, 2010 4:04 PM)  Physical Exam  General:  Well developed, well nourished, in  no acute distress. Head:  normocephalic and atraumatic Eyes:  PERRLA/EOM intact; conjunctiva and lids normal. Neck:  Neck supple, no JVD. No masses, thyromegaly or abnormal cervical nodes. Lungs:  Clear bilaterally to auscultation and percussion. Heart:  PMI nondisplaced, sustained PMI to palpation, no S1 aorta stenosis murmur S2 I cannot hear split no diastolic component bilateral carotid bruits Msk:  Back normal, normal gait. Muscle strength and tone normal. Pulses:  pulses normal in all 4 extremities Extremities:  No clubbing or cyanosis. Neurologic:  Alert and oriented x 3. Skin:  Intact without lesions or rashes. Psych:  Normal affect.   EKG  Procedure date:  04/05/2010  Findings:      sinus bradycardia, first-degree AV block, right bundle branch block.  Impression & Recommendations:  Problem # 1:  AORTIC STENOSIS (ICD-424.1) He will call Dr. Erin Fulling office and scheduled for mid October. All questions answered. With his first degree AV block and right bundle branch block, gave him a heads up he might need a pacer  after procedure. His updated medication list for this problem includes:    Altace 5 Mg Caps (Ramipril) .Marland Kitchen... Take one capsule once daily    Metoprolol Tartrate 50 Mg Tabs (Metoprolol tartrate) .Marland Kitchen... Take one half tablet twice a day    Nitrostat 0.4 Mg Subl (Nitroglycerin) .Marland Kitchen... 1 tablet under tongue at onset of chest pain; you may repeat every 5 minutes for up to 3 doses.  Problem # 2:  RBBB (ICD-426.4) Assessment: Unchanged  His updated medication list for this problem includes:    Aspirin Ec 325 Mg Tbec (Aspirin) .Marland Kitchen... Take 1 tablet by mouth once a day    Altace 5 Mg Caps (Ramipril) .Marland Kitchen... Take one capsule once daily    Plavix 75 Mg Tabs (Clopidogrel bisulfate) .Marland Kitchen... Take one tablet once daily    Metoprolol Tartrate 50 Mg Tabs (Metoprolol tartrate) .Marland Kitchen... Take one half tablet twice a day    Nitrostat 0.4 Mg Subl (Nitroglycerin) .Marland Kitchen... 1 tablet under tongue at onset of chest pain; you may repeat every 5 minutes for up to 3 doses.  His updated medication list for this problem includes:    Aspirin Ec 325 Mg Tbec (Aspirin) .Marland Kitchen... Take 1 tablet by mouth once a day    Altace 5 Mg Caps (Ramipril) .Marland Kitchen... Take one capsule once daily    Plavix 75 Mg Tabs (Clopidogrel bisulfate) .Marland Kitchen... Take one tablet once daily    Metoprolol Tartrate 50 Mg Tabs (Metoprolol tartrate) .Marland Kitchen... Take one half tablet twice a day    Nitrostat 0.4 Mg Subl (Nitroglycerin) .Marland Kitchen... 1 tablet under tongue at onset of chest pain; you may repeat every 5 minutes for up to 3 doses.  Problem # 3:  CORONARY ARTERY BYPASS GRAFT, HX OF (ICD-V45.81) All grafts were patent   Problem # 4:  MYOCARDIAL INFARCTION, HX OF (ICD-412) Assessment: Unchanged  His updated medication list for this problem includes:    Aspirin Ec 325 Mg Tbec (Aspirin) .Marland Kitchen... Take 1 tablet by mouth once a day    Altace 5 Mg Caps (Ramipril) .Marland Kitchen... Take one capsule once daily    Plavix 75 Mg Tabs (Clopidogrel bisulfate) .Marland Kitchen... Take one tablet once daily    Metoprolol Tartrate  50 Mg Tabs (Metoprolol tartrate) .Marland Kitchen... Take one half tablet twice a day    Nitrostat 0.4 Mg Subl (Nitroglycerin) .Marland Kitchen... 1 tablet under tongue at onset of chest pain; you may repeat every 5 minutes for up to 3 doses.  His updated medication list  for this problem includes:    Aspirin Ec 325 Mg Tbec (Aspirin) .Marland Kitchen... Take 1 tablet by mouth once a day    Altace 5 Mg Caps (Ramipril) .Marland Kitchen... Take one capsule once daily    Plavix 75 Mg Tabs (Clopidogrel bisulfate) .Marland Kitchen... Take one tablet once daily    Metoprolol Tartrate 50 Mg Tabs (Metoprolol tartrate) .Marland Kitchen... Take one half tablet twice a day    Nitrostat 0.4 Mg Subl (Nitroglycerin) .Marland Kitchen... 1 tablet under tongue at onset of chest pain; you may repeat every 5 minutes for up to 3 doses.  Problem # 5:  PERIPHERAL VASCULAR DISEASE (ICD-443.9) Assessment: New He  has a right renal artery stenosis. His blood pressure is under good control his creatinine is stable. Will monitor for now.  Problem # 6:  CAROTID ARTERY DISEASE (ICD-433.10) Assessment: Unchanged  His updated medication list for this problem includes:    Aspirin Ec 325 Mg Tbec (Aspirin) .Marland Kitchen... Take 1 tablet by mouth once a day    Plavix 75 Mg Tabs (Clopidogrel bisulfate) .Marland Kitchen... Take one tablet once daily  Problem # 7:  RENAL ARTERY STENOSIS (ICD-440.1) Assessment: New  Patient Instructions: 1)  Your physician recommends that you schedule a follow-up appointment in: AFTER  SURGERY 2)  Your physician recommends that you continue on your current medications as directed. Please refer to the Current Medication list given to you today.

## 2010-08-16 NOTE — Assessment & Plan Note (Signed)
Summary: EAR PAIN/SD   Vital Signs:  Patient profile:   77 year old male Height:      66 inches Weight:      152 pounds BMI:     24.62 O2 Sat:      96 % on Room air Temp:     98.5 degrees F oral Pulse rate:   57 / minute BP sitting:   122 / 68  (left arm) Cuff size:   large  Vitals Entered By: Bill Salinas CMA (June 08, 2010 2:37 PM)  O2 Flow:  Room air CC: pt here with c/o right ear pain which he describes as shooting pain with little relief taking tylenol/ pt is no longer taking altace/ ab   Primary Care Provider:  Jacques Navy MD  CC:  pt here with c/o right ear pain which he describes as shooting pain with little relief taking tylenol/ pt is no longer taking altace/ ab.  History of Present Illness: Patient seen acutely for painful right ear. He has recently had AVR. He is very concerned to detect any infection early. He has been doing very well post-op and has been home for several  weeks. He has not had any fever, drainage from the ear, loss of hearing, sorethroat, cough. He has a remote h/o TMJ. He does have bruxism and wears a bite-block at night.   Current Medications (verified): 1)  Aspirin Ec 325 Mg  Tbec (Aspirin) .... Take 1 Tablet By Mouth Once A Day 2)  Altace 5 Mg  Caps (Ramipril) .... Take One Capsule Once Daily 3)  Doxazosin Mesylate 4 Mg  Tabs (Doxazosin Mesylate) .... 1/2 By Mouth Two Times A Day 4)  Vitamin C 500 Mg  Tabs (Ascorbic Acid) .... Take 1 Tablet By Mouth Two Times A Day 5)  Calcium 600 Mg  Tabs (Calcium) .Marland Kitchen.. 1 Once Daily 6)  Vytorin 10-40 Mg  Tabs (Ezetimibe-Simvastatin) .... Take One Tablet Once Daily 7)  Multivitamins   Tabs (Multiple Vitamin) .... Take One Tablet Once Daily 8)  Fish Oil 1000 Mg  Caps (Omega-3 Fatty Acids) .Marland Kitchen.. 1 Cap Three Times A Day 9)  Zyrtec Allergy 10 Mg Tabs (Cetirizine Hcl) .... 1/2 Tab Two Times A Day 10)  Plavix 75 Mg  Tabs (Clopidogrel Bisulfate) .... Take One Tablet Once Daily 11)  Metoprolol Tartrate 50 Mg  Tabs (Metoprolol Tartrate) .... Take One Tablet Twice A Day 12)  Nitrostat 0.4 Mg Subl (Nitroglycerin) .Marland Kitchen.. 1 Tablet Under Tongue At Onset of Chest Pain; You May Repeat Every 5 Minutes For Up To 3 Doses. 13)  Amoxicillin 500 Mg Caps (Amoxicillin) .... Take 4 Capsules 1 Hour Before Your Dental Procedure  Allergies (verified): 1)  ! 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) BENIGN PROSTATIC HYPERTROPHY, HX OF (ICD-V13.8) SLEEP APNEA (ICD-780.57)     Past Surgical History: * Hx of CYSTOSCOPY WITH BLADDER BIPOSY PERCUTANEOUS TRANSLUMINAL CORONARY ANGIOPLASTY, HX OF (ICD-V45.82) CORONARY ARTERY BYPASS GRAFT, HX OF (ICD-V45.81)  Redo median sternotomy, extracorporeal circulation, aortic valve replacement using a 23-mm Mitroflow aortic pericardial heart valve.  SURGEON:  [name], MD 04/18/2010  FH reviewed for relevance, SH/Risk Factors reviewed for relevance  Review of Systems  The patient denies anorexia, fever, weight loss, decreased hearing, headaches, abdominal pain, abnormal bleeding, and enlarged lymph nodes.    Physical Exam  General:  Well-developed,well-nourished,in no acute distress; alert,appropriate and cooperative throughout examination Head:  Normocephalic and atraumatic without obvious abnormalities. No apparent alopecia or balding. Ears:  Right EAC clear, TM appears normal Neck:  supple.   Chest Wall:  well healed redo sternotomy scar  and chest tube sites look healed.  Lungs:  Normal respiratory effort, chest expands symmetrically. Lungs are clear to auscultation, no crackles or wheezes. Heart:  normal rate and regular rhythm.   Msk:  normal ROM.  Mild tenderness to pressure against the right TMJ from witin the EAC. Slight crepitis. Pulses:  2+ radial Neurologic:  alert & oriented X3, cranial nerves II-XII intact, and gait normal.   Skin:  turgor normal and color normal.   Cervical Nodes:  no anterior cervical adenopathy and no posterior cervical adenopathy.     Impression & Recommendations:  Problem # 1:  TMJ SYNDROME (ICD-524.60) Pain in the region of the right ear. No evidence of infection by history or on exam. He does have mild joint tenderness at right TMJ  Plan - continue with bite block           small bites           aspercreme external aspect of joint.           Limited dosing of otc NSAIDs  Complete Medication List: 1)  Aspirin Ec 325 Mg Tbec (Aspirin) .... Take 1 tablet by mouth once a day 2)  Altace 5 Mg Caps (Ramipril) .... Take one capsule once daily 3)  Doxazosin Mesylate 4 Mg Tabs (Doxazosin mesylate) .... 1/2 by mouth two times a day 4)  Vitamin C 500 Mg Tabs (Ascorbic acid) .... Take 1 tablet by mouth two times a day 5)  Calcium 600 Mg Tabs (Calcium) .Marland Kitchen.. 1 once daily 6)  Vytorin 10-40 Mg Tabs (Ezetimibe-simvastatin) .... Take one tablet once daily 7)  Multivitamins Tabs (Multiple vitamin) .... Take one tablet once daily 8)  Fish Oil 1000 Mg Caps (Omega-3 fatty acids) .Marland Kitchen.. 1 cap three times a day 9)  Zyrtec Allergy 10 Mg Tabs (Cetirizine hcl) .... 1/2 tab two times a day 10)  Plavix 75 Mg Tabs (Clopidogrel bisulfate) .... Take one tablet once daily 11)  Metoprolol Tartrate 50 Mg Tabs (Metoprolol tartrate) .... Take one tablet twice a day 12)  Nitrostat 0.4 Mg Subl (Nitroglycerin) .Marland Kitchen.. 1 tablet under tongue at onset of chest pain; you may repeat every 5 minutes for up to 3 doses. 13)  Amoxicillin  500 Mg Caps (Amoxicillin) .... Take 4 capsules 1 hour before your dental procedure   Orders Added: 1)  Est. Patient Level III [15176]

## 2010-08-16 NOTE — Assessment & Plan Note (Signed)
Summary: 4 MO F/U .Zack Seal  Medications Added ZYRTEC ALLERGY 10 MG TABS (CETIRIZINE HCL) 1/2 tab two times a day METOPROLOL TARTRATE 50 MG TABS (METOPROLOL TARTRATE) Take one half tablet twice a day        Visit Type:  4 mo f/u Primary Carolan Avedisian:  Jacques Navy MD  CC:  sob w/walking......denies any cp or edema..pt is having a Cystoscope 03/23/10 w/Dr. Vonita Moss.  History of Present Illness: Mr Lemoine returns today for reconsideration of catheterization and possible replacement of his aortic valve. Please see last note for details.  He is gotten through the summer as he requested that he is having significant dyspnea now and some dizziness with standing and bending over. He denies any syncope. He's had no angina.  The last echocardiogram I reviewed with him again today shows a mean gradient of 42 mm of mercury. His carotids were stable in April. Stress Myoview in April also showed relatively good exercise tolerance though reduced from and and his good physical shape. 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 EF was 63%. There was no significant ischemia. Laboratory data from a research study reviewed and his lipids are at goal.  He would like to have the catheterization now in consideration for surgery. He was operated on by Dr. Lavinia Sharps in the past.  Current Medications (verified): 1)  Aspirin Ec 325 Mg  Tbec (Aspirin) .... Take 1 Tablet By Mouth Once A Day 2)  Altace 5 Mg  Caps (Ramipril) .... Take One Capsule Once Daily 3)  Doxazosin Mesylate 4 Mg  Tabs (Doxazosin Mesylate) .... Take 1 Tablet By Mouth Two Times A Day 4)  Vitamin C 500 Mg  Tabs (Ascorbic Acid) .... Take 1 Tablet By Mouth Two Times A Day 5)  Calcium 600 Mg  Tabs (Calcium) .Marland Kitchen.. 1 Once Daily 6)  Vytorin 10-40 Mg  Tabs (Ezetimibe-Simvastatin) .... Take One Tablet Once Daily 7)  Multivitamins   Tabs (Multiple Vitamin) .... Take One Tablet Once Daily 8)  Fish Oil 1000 Mg  Caps (Omega-3 Fatty Acids) .Marland Kitchen.. 1 Cap Three Times A Day 9)   Zyrtec Allergy 10 Mg Tabs (Cetirizine Hcl) .... 1/2 Tab Two Times A Day 10)  Plavix 75 Mg  Tabs (Clopidogrel Bisulfate) .... Take One Tablet Once Daily 11)  Metoprolol Tartrate 50 Mg Tabs (Metoprolol Tartrate) .... Take One Tablet By Mouth Twice A Day 12)  Magnesium Oxide 400 Mg  Tabs (Magnesium Oxide) .... Take One Tablet Daily 13)  Nitrostat 0.4 Mg Subl (Nitroglycerin) .Marland Kitchen.. 1 Tablet Under Tongue At Onset of Chest Pain; You May Repeat Every 5 Minutes For Up To 3 Doses.  Allergies: 1)  ! 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) BENIGN PROSTATIC HYPERTROPHY, HX OF (ICD-V13.8) SLEEP APNEA (ICD-780.57)     Past Surgical History: Last updated: 12/11/2008 * Hx of CYSTOSCOPY WITH BLADDER BIPOSY PERCUTANEOUS TRANSLUMINAL CORONARY ANGIOPLASTY, HX OF (ICD-V45.82) CORONARY ARTERY BYPASS GRAFT, HX OF (ICD-V45.81)  Family History: Last updated: Dec 08, 2007 father-deceased @86 : CAD/MI w/ sudden death 1 wk post-MI mother-deceased @ 17: HTN, DM, Lipid, cardiomyopathy, CAD Neg- colon cancer Uncle Prostate cancer  Social History:  Last updated: 11/26/2007 Lucita Ferrara, did some graduate work Married '52 1 son - '61; 2 daughters -'85, '63: grandchildren 7; great-grands 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 good marriage.  Risk Factors: Alcohol Use: <1 (01/05/2009) Caffeine Use: 2 cups (01/05/2009) Diet: heart healthy (01/05/2009) Exercise: yes (01/05/2009)  Risk  Factors: Smoking Status: quit (01/05/2009)  Review of Systems       negative other than history of present illness  Vital Signs:  Patient profile:   77 year old male Height:      66 inches Weight:      153.12 pounds BMI:     24.80 Pulse rate:   44 / minute Pulse rhythm:   irregular BP sitting:   130 / 60  (left arm) Cuff size:   large  Vitals Entered By: Danielle Rankin, CMA (March 09, 2010 10:28 AM)  Physical Exam  General:  Well developed, well nourished, in no acute distress. Head:  normocephalic and atraumatic Eyes:  PERRLA/EOM intact; conjunctiva and lids normal. Mouth:  Teeth, gums and palate normal. Oral mucosa normal. Neck:  Neck supple, no JVD. No masses, thyromegaly or abnormal cervical nodes. Chest Wall:  no deformities or breast masses noted Lungs:  Clear bilaterally to auscultation and percussion. Heart:  PMI is sustained, normal S1 soft S2, aortic stenosis murmur grade 3/6. No gallop. Carotid upstrokes are delayed and have bruits and referred sounds Msk:  Back normal, normal gait. Muscle strength and tone normal. Pulses:  pulses normal in all 4 extremities Extremities:  No clubbing or cyanosis. Neurologic:  Alert and oriented x 3. Skin:  Intact without lesions or rashes. Psych:  Normal affect.   Impression & Recommendations:  Problem # 1:  AORTIC STENOSIS (ICD-424.1) Assessment Deteriorated I feel he is now more symptomatic and has a mean gradient of 42 by echocardiography. He is very active and is now limited. Have recommended right and left heart catheterization and consideration of aortic valve replacement. He and his wife agree the plan. We'll arrange to the outpatient lab. Dr. Lavinia Sharps was his surgeon. His updated medication list for this problem includes:    Altace 5 Mg Caps (Ramipril) .Marland Kitchen... Take one capsule once daily    Metoprolol Tartrate 50 Mg Tabs (Metoprolol tartrate) .Marland Kitchen... Take one half tablet twice a day    Nitrostat 0.4 Mg Subl (Nitroglycerin)  .Marland Kitchen... 1 tablet under tongue at onset of chest pain; you may repeat every 5 minutes for up to 3 doses.  Orders: TLB-CBC Platelet - w/Differential (85025-CBCD) TLB-BMP (Basic Metabolic Panel-BMET) (80048-METABOL) TLB-PT (Protime) (85610-PTP) TLB-PTT (85730-PTTL) EKG w/ Interpretation (93000)  Problem # 2:  RBBB (ICD-426.4) Assessment: Unchanged  His updated medication list for this problem includes:    Aspirin Ec 325 Mg Tbec (Aspirin) .Marland Kitchen... Take 1 tablet by mouth once a day    Altace 5 Mg Caps (Ramipril) .Marland Kitchen... Take one capsule once daily    Plavix 75 Mg Tabs (Clopidogrel bisulfate) .Marland Kitchen... Take one tablet once daily    Metoprolol Tartrate 50 Mg Tabs (Metoprolol tartrate) .Marland Kitchen... Take one half tablet twice a day    Nitrostat 0.4 Mg Subl (Nitroglycerin) .Marland Kitchen... 1 tablet under tongue at onset of chest pain; you may repeat every 5 minutes for up to 3 doses.  Problem # 3:  CORONARY ARTERY BYPASS GRAFT, HX OF (ICD-V45.81) Assessment: Unchanged  Problem # 4:  MYOCARDIAL INFARCTION, HX OF (ICD-412) Assessment: Unchanged  His updated medication list for this problem includes:    Aspirin Ec 325 Mg Tbec (  Aspirin) .Marland Kitchen... Take 1 tablet by mouth once a day    Altace 5 Mg Caps (Ramipril) .Marland Kitchen... Take one capsule once daily    Plavix 75 Mg Tabs (Clopidogrel bisulfate) .Marland Kitchen... Take one tablet once daily    Metoprolol Tartrate 50 Mg Tabs (Metoprolol tartrate) .Marland Kitchen... Take one half tablet twice a day    Nitrostat 0.4 Mg Subl (Nitroglycerin) .Marland Kitchen... 1 tablet under tongue at onset of chest pain; you may repeat every 5 minutes for up to 3 doses.  Problem # 5:  CAROTID ARTERY DISEASE (ICD-433.10) Assessment: Unchanged  His updated medication list for this problem includes:    Aspirin Ec 325 Mg Tbec (Aspirin) .Marland Kitchen... Take 1 tablet by mouth once a day    Plavix 75 Mg Tabs (Clopidogrel bisulfate) .Marland Kitchen... Take one tablet once daily  Problem # 6:  HYPERLIPIDEMIA (ICD-272.4) Assessment: Improved  His updated medication list  for this problem includes:    Vytorin 10-40 Mg Tabs (Ezetimibe-simvastatin) .Marland Kitchen... Take one tablet once daily  Problem # 7:  RENAL ARTERY STENOSIS (ICD-440.1) Assessment: Unchanged I would do contrast of the renal arteries and distal aorta during the catheterization.  Problem # 8:  SLEEP APNEA (ICD-780.57) Assessment: Unchanged  Patient Instructions: 1)  Your physician recommends that you continue on your current medications as directed. Please refer to the Current Medication list given to you today. 2)  Your physician has requested that you have a cardiac catheterization.  Cardiac catheterization is used to diagnose and/or treat various heart conditions. Doctors may recommend this procedure for a number of different reasons. The most common reason is to evaluate chest pain. Chest pain can be a symptom of coronary artery disease (CAD), and cardiac catheterization can show whether plaque is narrowing or blocking your heart's arteries. This procedure is also used to evaluate the valves, as well as measure the blood flow and oxygen levels in different parts of your heart.  For further information please visit [email or web address]  Please follow instruction sheet, as given.

## 2010-08-16 NOTE — Assessment & Plan Note (Signed)
Summary: ROV PT REQUEST TO DISCUSS ECHO ./CY  Medications Added FISH OIL 1000 MG  CAPS (OMEGA-3 FATTY ACIDS) 1 cap three times a day NITROSTAT 0.4 MG SUBL (NITROGLYCERIN) 1 tablet under tongue at onset of chest pain; you may repeat every 5 minutes for up to 3 doses.        Visit Type:  rov Primary Provider:  Jacques Navy MD   History of Present Illness: [name] comes in today to discuss the findings of his recent 2-D echocardiogram. He also stress nuclear study that was stable for followup of his previous cardiac bypass grafting in 11/28/93 and his carotid Dopplers were also stable with nonobstructive plaque.  His echocardiogram now shows his aortic valve 2-D and the severe aortic stenosis category. Aortic valve area was less than 1.0 cm square and his mean gradient is now 42 mm mercury.  He is asymptomatic at this point. He works in his yard quite a bit and does a fair amount of manual work.  Current Medications (verified): 1)  Aspirin Ec 325 Mg  Tbec (Aspirin) .... Take 1 Tablet By Mouth Once A Day 2)  Altace 5 Mg  Caps (Ramipril) .... Take One Capsule Once Daily 3)  Doxazosin Mesylate 4 Mg  Tabs (Doxazosin Mesylate) .... Take 1 Tablet By Mouth Two Times A Day 4)  Vitamin C 500 Mg  Tabs (Ascorbic Acid) .... Take 1 Tablet By Mouth Two Times A Day 5)  Calcium 600 Mg  Tabs (Calcium) .Marland Kitchen.. 1 Once Daily 6)  Vytorin 10-40 Mg  Tabs (Ezetimibe-Simvastatin) .... Take One Tablet Once Daily 7)  Multivitamins   Tabs (Multiple Vitamin) .... Take One Tablet Once Daily 8)  Fish Oil 1000 Mg  Caps (Omega-3 Fatty Acids) .Marland Kitchen.. 1 Cap Three Times A Day 9)  Loratadine 10 Mg  Tabs (Loratadine) .... Take One Tablet Once Daily 10)  Plavix 75 Mg  Tabs (Clopidogrel Bisulfate) .... Take One Tablet Once Daily 11)  Metoprolol Tartrate 50 Mg Tabs (Metoprolol Tartrate) .... Take One Tablet By Mouth Twice A Day 12)  Magnesium Oxide 400 Mg  Tabs (Magnesium Oxide) .... Take One Tablet Daily 13)  Nitrostat 0.4 Mg  Subl (Nitroglycerin) .Marland Kitchen.. 1 Tablet Under Tongue At Onset of Chest Pain; You May Repeat Every 5 Minutes For Up To 3 Doses.  Allergies: 1)  ! 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) BENIGN PROSTATIC HYPERTROPHY, HX OF (ICD-V13.8) SLEEP APNEA (ICD-780.57)     Past Surgical History: Last updated: 12/11/2008 * Hx of CYSTOSCOPY WITH BLADDER BIPOSY PERCUTANEOUS TRANSLUMINAL CORONARY ANGIOPLASTY, HX OF (ICD-V45.82) CORONARY ARTERY BYPASS GRAFT, HX OF (ICD-V45.81)  Family History: Last updated: 11-29-2007 father-deceased @86 : CAD/MI w/ sudden death 1 wk post-MI mother-deceased @ 48: HTN, DM, Lipid, cardiomyopathy, CAD Neg- colon cancer Uncle Prostate cancer  Social History: Last updated: Nov 29, 2007 [name], did some graduate work Married 2050-11-29 1 son - 11-29-2059; 2 daughters -'38, 11/28/2061: grandchildren 7; great-grands 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 good marriage.  Risk Factors: Alcohol Use: <1 (01/05/2009) Caffeine Use: 2 cups (01/05/2009) Diet: heart healthy (01/05/2009) Exercise: yes (01/05/2009)  Risk Factors: Smoking  Status: quit (01/05/2009)  Review of Systems       negative  Vital Signs:  Patient profile:   77 year old male Height:      66 inches Weight:      157 pounds BMI:     25.43 Pulse rate:   60 / minute Pulse rhythm:   regular BP sitting:   146 / 72  (left arm) Cuff size:   large  Vitals Entered By: [name], CMA (November 11, 2009 12:46 PM)   Impression & Recommendations:  Problem # 1:  AORTIC STENOSIS (ICD-424.1) Assessment Deteriorated I have had a long talk with [name] and his wife. I explained to him using a heart model was severe aortic stenosis is hand symptoms associated with it. I have suggested that we be proactive and look at aortic valve replacement sooner than later because the increased risk of sudden cardiac death and the fact that he is in good shape physically. His previous bypass surgery in 1995 was done by Dr. Laneta Simmers who we would reconsult he was to think about it. He really would like to have the summer intact. I think this is reasonable we can certainly set up an outpatient cardiac catheterization in early fall. I've warned him of symptoms that I should know about. He also knows to pace himself to stay well hydrated. He may decide to do it sooner. He has a number to call the office. His updated medication list for this problem includes:    Altace 5 Mg Caps (Ramipril) .Marland Kitchen... Take one capsule once daily    Metoprolol Tartrate 50 Mg Tabs (Metoprolol tartrate) .Marland Kitchen... Take one tablet by mouth twice a day    Nitrostat 0.4 Mg Subl (Nitroglycerin) .Marland Kitchen... 1 tablet under tongue at onset of chest pain; you may repeat every 5 minutes for up to 3 doses.  Problem # 2:  CORONARY ARTERY BYPASS GRAFT, HX OF (ICD-V45.81) Assessment: Unchanged  Problem # 3:  MYOCARDIAL INFARCTION, HX OF (ICD-412) Assessment: Unchanged  His updated medication list for this problem includes:    Aspirin Ec 325 Mg Tbec (Aspirin) .Marland Kitchen... Take 1 tablet by mouth once a day    Altace 5 Mg Caps (Ramipril) .Marland Kitchen... Take one capsule once daily    Plavix 75 Mg Tabs (Clopidogrel bisulfate) .Marland Kitchen... Take one tablet once daily    Metoprolol Tartrate 50 Mg Tabs (Metoprolol tartrate) .Marland Kitchen... Take one tablet by mouth twice a day    Nitrostat 0.4 Mg Subl (Nitroglycerin) .Marland Kitchen... 1 tablet under tongue at onset of chest pain; you may repeat every 5 minutes  for up to 3 doses.  Problem # 4:  CAROTID ARTERY DISEASE (ICD-433.10) Assessment: Unchanged  His updated medication list for this problem includes:    Aspirin Ec 325 Mg Tbec (Aspirin) .Marland Kitchen... Take 1 tablet by mouth once a day    Plavix 75 Mg Tabs (Clopidogrel bisulfate) .Marland Kitchen... Take one tablet once daily  Patient Instructions: 1)  Your physician recommends that you schedule a follow-up appointment in: 4 MONTHS WITH DR Elon Eoff 2)  Your physician recommends that you continue on your current medications as directed. Please refer to the Current Medication list given to you today. Prescriptions: NITROSTAT 0.4 MG SUBL (NITROGLYCERIN) 1 tablet under tongue at onset of chest pain; you may repeat every 5 minutes for up to 3 doses.  #25 x 9   Entered by:   [name], CMA   Authorized by:   Gaylord Shih, MD, [facility]  Signed by:   [name], CMA on 11/11/2009   Method used:   Electronically to        [facility] [address] (retail)       [address]       [place], [place]  [address]       Ph: [phone] or [phone]       Fax: [phone]   RxID:   [id number]

## 2010-08-16 NOTE — Consult Note (Signed)
Summary: [facility]  [facility]   Imported By: Lester [place] 04/08/2010 10:56:45  _____________________________________________________________________  External Attachment:    Type:   Image     Comment:   External Document

## 2010-08-16 NOTE — Progress Notes (Signed)
Summary: Nuc. Pre-Procedure  Phone Note Outgoing Call Call back at [facility] Phone [phone]   Call placed by: Irean Hong, RN,  November 03, 2009 4:23 PM Summary of Call: Reviewed information on Myoview Information Sheet (see scanned document for further details).  Spoke with patient.     Nuclear Med Background Indications for Stress Test: Evaluation for Ischemia, Graft Patency, Stent Patency   History: CABG, Echo, Heart Catheterization, Myocardial Infarction, Myocardial Perfusion Study, Stents  History Comments: "95 CABG x5. 4/08 MPS: NL, EF=59%. 12/08 NSTEMI>Cath: SVG>OM 95%>Stent , other grafts patent, EF=65-70%. 4/10 Echo: EF=60-65%, mod-severe AI.  Symptoms: Fatigue with Exertion    Nuclear Pre-Procedure Cardiac Risk Factors: Carotid Disease, Claudication, Family History - CAD, History of Smoking, Lipids, PVD, RBBB Height (in): 66

## 2010-08-16 NOTE — Assessment & Plan Note (Signed)
Summary: YEARLY--STC   Vital Signs:  Patient profile:   77 year old male Height:      66 inches Weight:      157 pounds BMI:     25.43 O2 Sat:      95 % on Room air Temp:     98.2 degrees F oral Pulse rate:   52 / minute BP sitting:   122 / 62  (left arm) Cuff size:   regular  Vitals Entered By: Bill Salinas CMA (January 06, 2010 9:59 AM)  O2 Flow:  Room air  Vision Screening:      Vision Comments: normal eye exam with no changes March 2011 with Dr Dione Booze   Current Medications (verified): 1)  Aspirin Ec 325 Mg  Tbec (Aspirin) .... Take 1 Tablet By Mouth Once A Day 2)  Altace 5 Mg  Caps (Ramipril) .... Take One Capsule Once Daily 3)  Doxazosin Mesylate 4 Mg  Tabs (Doxazosin Mesylate) .... Take 1 Tablet By Mouth Two Times A Day 4)  Vitamin C 500 Mg  Tabs (Ascorbic Acid) .... Take 1 Tablet By Mouth Two Times A Day 5)  Calcium 600 Mg  Tabs (Calcium) .Marland Kitchen.. 1 Once Daily 6)  Vytorin 10-40 Mg  Tabs (Ezetimibe-Simvastatin) .... Take One Tablet Once Daily 7)  Multivitamins   Tabs (Multiple Vitamin) .... Take One Tablet Once Daily 8)  Fish Oil 1000 Mg  Caps (Omega-3 Fatty Acids) .Marland Kitchen.. 1 Cap Three Times A Day 9)  Loratadine 10 Mg  Tabs (Loratadine) .... Take One Tablet Once Daily 10)  Plavix 75 Mg  Tabs (Clopidogrel Bisulfate) .... Take One Tablet Once Daily 11)  Metoprolol Tartrate 50 Mg Tabs (Metoprolol Tartrate) .... Take One Tablet By Mouth Twice A Day 12)  Magnesium Oxide 400 Mg  Tabs (Magnesium Oxide) .... Take One Tablet Daily 13)  Nitrostat 0.4 Mg Subl (Nitroglycerin) .Marland Kitchen.. 1 Tablet Under Tongue At Onset of Chest Pain; You May Repeat Every 5 Minutes For Up To 3 Doses. 14)  Magnesium Oxide 400 Mg Tabs (Magnesium Oxide) .Marland Kitchen.. 1 Tab Daily  Allergies (verified): 1)  ! Sulfa  Physical Exam  General:  WNWD white male looking younger than his stated age Head:  normocephalic and atraumatic.   Eyes:  vision grossly intact, pupils equal, pupils round, corneas and lenses clear, no injection,  no optic disk abnormalities, and no retinal abnormalitiies.   Ears:  External ear exam shows no significant lesions or deformities.  Otoscopic examination reveals clear canals, tympanic membranes are intact bilaterally without bulging, retraction, inflammation or discharge. Hearing is grossly normal bilaterally. Nose:  no external deformity and no external erythema.   Mouth:  Oral mucosa and oropharynx without lesions or exudates.  Teeth in good repair. Neck:  supple, full ROM, and no thyromegaly.  1+ carotid bruits bilaterallyu Chest Wall:  well healed sternotomy scar Lungs:  Normal respiratory effort, chest expands symmetrically. Lungs are clear to auscultation, no crackles or wheezes. Heart:  normal rate and regular rhythm.  III/VI harsh machinary murmur heard loudest at the RSB. Abdomen:  soft, non-tender, normal bowel sounds, no guarding, and no hepatomegaly.   Prostate:  deferred to GU Msk:  normal ROM, no joint tenderness, no joint swelling, no joint warmth, no redness over joints, and no joint deformities.   Pulses:  2+ radial and DP pulses Extremities:  No clubbing, cyanosis, edema, or deformity noted with normal full range of motion of all joints.   Neurologic:  alert & oriented X3,  cranial nerves II-XII intact, strength normal in all extremities, gait normal, and DTRs symmetrical and normal.   Skin:  turgor normal, color normal, no rashes, and no suspicious lesions.  1 cm skin cyst right posterior neck at the hairline. No tenderness or fluctuance Cervical Nodes:  no anterior cervical adenopathy and no posterior cervical adenopathy.   Psych:  Oriented X3, memory intact for recent and remote, normally interactive, good eye contact, and not anxious appearing.     Impression & Recommendations:  Problem # 1:  AORTIC STENOSIS (ICD-424.1) Reviewed 2D echo, Dr. Vern Claude note. discussed this with the patient. Provided encouragement re: low operative risk, the fact that there is high volume of  surgery of this nature at [facility] and by Dr. Laneta Simmers. He will work with Dr. Daleen Squibb on all appropriate scheduling issues, i.e. cath, referrals, etc.  His updated medication list for this problem includes:    Aspirin Ec 325 Mg Tbec (Aspirin) .Marland Kitchen... Take 1 tablet by mouth once a day    Plavix 75 Mg Tabs (Clopidogrel bisulfate) .Marland Kitchen... Take one tablet once daily    Metoprolol Tartrate 50 Mg Tabs (Metoprolol tartrate) .Marland Kitchen... Take one tablet by mouth twice a day  Problem # 2:  CORONARY ARTERY DISEASE (ICD-414.00) Reviewed patient's recent NST results - old injury but no active ischemia. He is asymptomatic  Plan - continue present regimen  His updated medication list for this problem includes:    Aspirin Ec 325 Mg Tbec (Aspirin) .Marland Kitchen... Take 1 tablet by mouth once a day    Altace 5 Mg Caps (Ramipril) .Marland Kitchen... Take one capsule once daily    Doxazosin Mesylate 4 Mg Tabs (Doxazosin mesylate) .Marland Kitchen... Take 1 tablet by mouth two times a day    Plavix 75 Mg Tabs (Clopidogrel bisulfate) .Marland Kitchen... Take one tablet once daily    Metoprolol Tartrate 50 Mg Tabs (Metoprolol tartrate) .Marland Kitchen... Take one tablet by mouth twice a day    Nitrostat 0.4 Mg Subl (Nitroglycerin) .Marland Kitchen... 1 tablet under tongue at onset of chest pain; you may repeat every 5 minutes for up to 3 doses.  Problem # 3:  CAROTID ARTERY DISEASE (ICD-433.10) Reviewed last carotid doppler: 50-79% ICA occlusion but without progression.   Plan - follow-up at 6 months.   His updated medication list for this problem includes:    Aspirin Ec 325 Mg Tbec (Aspirin) .Marland Kitchen... Take 1 tablet by mouth once a day    Plavix 75 Mg Tabs (Clopidogrel bisulfate) .Marland Kitchen... Take one tablet once daily  Problem # 4:  CEREBROVASCULAR DISEASE (ICD-437.9) Stable with no new symptoms. He is highly functional with sequelae.  Plan - continue ASA and plavix  Problem # 5:  BENIGN PROSTATIC HYPERTROPHY, HX OF (ICD-V13.8) Stable without excessive nocturia.  Problem # 6:  Preventive Health Care  (ICD-V70.0) Unremarkable interval history. Physical exam was unremarkable. Reviewed labs done at end of [place] [facility] trial which includes normal chemistries and excellent lipid control. Last colonoscopy '08 - hyperplastic polyp. Due for follow-up per GI protocol in 2013. Up to date with pneumonia vaccine. Patient is in good spirits and has a positive outlook regarding his condition. He has no increased risk for falls. He is very cautious about accident prevention and injury, especially since he does a lot of yard work.  In summary - a very nice man who is medically stable. He is a good candidate for proposed AoV replacement. He will return as needed.   Complete Medication List: 1)  Aspirin Ec 325 Mg  Tbec (Aspirin) .... Take 1 tablet by mouth once a day 2)  Altace 5 Mg Caps (Ramipril) .... Take one capsule once daily 3)  Doxazosin Mesylate 4 Mg Tabs (Doxazosin mesylate) .... Take 1 tablet by mouth two times a day 4)  Vitamin C 500 Mg Tabs (Ascorbic acid) .... Take 1 tablet by mouth two times a day 5)  Calcium 600 Mg Tabs (Calcium) .Marland Kitchen.. 1 once daily 6)  Vytorin 10-40 Mg Tabs (Ezetimibe-simvastatin) .... Take one tablet once daily 7)  Multivitamins Tabs (Multiple vitamin) .... Take one tablet once daily 8)  Fish Oil 1000 Mg Caps (Omega-3 fatty acids) .Marland Kitchen.. 1 cap three times a day 9)  Loratadine 10 Mg Tabs (Loratadine) .... Take one tablet once daily 10)  Plavix 75 Mg Tabs (Clopidogrel bisulfate) .... Take one tablet once daily 11)  Metoprolol Tartrate 50 Mg Tabs (Metoprolol tartrate) .... Take one tablet by mouth twice a day 12)  Magnesium Oxide 400 Mg Tabs (Magnesium oxide) .... Take one tablet daily 13)  Nitrostat 0.4 Mg Subl (Nitroglycerin) .Marland Kitchen.. 1 tablet under tongue at onset of chest pain; you may repeat every 5 minutes for up to 3 doses. 14)  Magnesium Oxide 400 Mg Tabs (Magnesium oxide) .Marland Kitchen.. 1 tab daily  Other Orders: Subsequent annual wellness visit with prevention plan [phone])

## 2010-08-16 NOTE — Miscellaneous (Signed)
  Clinical Lists Changes  Observations: Added new observation of RS STUDY: TRACER - study completion 08/10/09 (10/12/2009 11:24)      Research Study Name: TRACER - study completion 08/10/09

## 2010-08-16 NOTE — Assessment & Plan Note (Signed)
Summary: [name]   Nurse Visit   Allergies: 1)  ! Sulfa  Orders Added: 1)  Flu Vaccine 50yrs + MEDICARE PATIENTS [Q2039] 2)  Administration Flu vaccine - MCR [G0008] Flu Vaccine Consent Questions     Do you have a history of severe allergic reactions to this vaccine? no    Any prior history of allergic reactions to egg and/or gelatin? no    Do you have a sensitivity to the preservative Thimersol? no    Do you have a past history of Guillan-Barre Syndrome? no    Do you currently have an acute febrile illness? no    Have you ever had a severe reaction to latex? no    Vaccine information given and explained to patient? yes    Are you currently pregnant? no    Lot Number:[id number]   Exp Date:01/14/2011   Site Given  Left Deltoid IM.lbmedflu

## 2010-08-16 NOTE — Progress Notes (Signed)
Summary: [employer]/has pt tried any other [place] drugs   Phone Note Other Incoming Call back at [phone]   Caller: Darl Pikes from [employer] Reason for Call: Get patient information Summary of Call: they recieved a request for vytorin 90tabs and they need to know if pt has tried any of the other generic [place] drugs Initial call taken by: Omer Jack,  August 13, 2009 5:31 PM  Follow-up for Phone Call        SPOKE WITH PT HAS TAKEN ZETIA ZOCOR IN PAST Scherrie Bateman, LPN  August 16, 2009 12:55 PM SPOKE WITH SUSAN IS GOING TO APPROVE VYTORIN. PT AWARE Follow-up by: Scherrie Bateman, LPN,  August 16, 2009 12:58 PM

## 2010-08-16 NOTE — Letter (Signed)
Summary: [employer] [place]  [place]   Imported By: Marylou Mccoy 12/22/2009 16:50:53  _____________________________________________________________________  External Attachment:    Type:   Image     Comment:   External Document

## 2010-08-16 NOTE — Assessment & Plan Note (Signed)
Summary: CAD/ANAS  Medications Added CALCIUM 600 MG  TABS (CALCIUM) 1 once daily        Visit Type:  Follow-up Primary Provider:  Jacques Navy MD  CC:  NO COMPLAINTS.  History of Present Illness: Mr [name] returns today for evaluation and management of his cardiac and vascular disease.  He is totally asymptomatic. He does fatigue a little easier but still is able to till his garden without any chest discomfort, presyncope or syncope, or significant dyspnea on exertion.  I reviewed symptoms of TIAs or mini strokes she denies.    Clinical Reports Reviewed:  Cardiac Cath:  06/20/2007: Cardiac Cath Findings:   ANGIOGRAPHIC DATA:  The vein graft prior to intervention demonstrated   90% stenosis and afterwards 0%.  No complications were noted.      DISPOSITION:  The patient will be treated with aspirin and Plavix,   continue to follow up with Dr. Daleen Squibb with risk factor reduction.            Arturo Morton. Riley Kill, MD, [facility]   Electronically Signed     Cardiac Cath Findings:   Left ventriculogram done in the RAO position showed an ejection fraction   of 65-70% with no Jaystin Mcgarvey motion abnormalities.  No significant mitral   regurgitation.      ASSESSMENT:   1. Three vessel native coronary artery disease.   2. LIMA to the LAD is patent.  However, the distal LAD fills mainly       through the native vessel.   3. Saphenous vein graft to the diagonal is okay.  Saphenous vein graft       to the PDA with a questionable sequential graft to the posterior       lateral is okay.  However, it is unclear if the distal limb is       patent or not, or whether or not this is native vessel.   4. Saphenous vein graft to the OM has a ruptured plaque with a 95%       stenosis proximally.   5. Normal LV function.   6. Question of mild aortic stenosis on pullback across the aortic       valve.      PLAN:  Percutaneous intervention from the saphenous vein graft to the OM   with Dr. Riley Kill.   Would also check a 2D echocardiogram to evaluate the   aortic valve.               Bevelyn Buckles. Bensimhon, MD   Electronically Signed      08/25/1993: Cardiac Cath Findings:  Final Diagnosis: 1) Well preserved left ventricular function 2) Multivessel coronary artery disease.  Shawnie Pons, MD  Carotid Doppler:  05/14/2008:  Impressions: Stable, bilateral carotid disease Elevated velocities in the right subclavian artery. 60-79% RICA stenosis 40-59% LICA stenosis  Burna Cash  05/14/2007:  Impressions: Essentially stable carotid artery disease, bilaterally 60-79% RICA stenosis 40-59% LICA stenosis  Tonny Bollman, MD   Current Medications (verified): 1)  Aspirin Ec 325 Mg  Tbec (Aspirin) .... Take 1 Tablet By Mouth Once A Day 2)  Altace 5 Mg  Caps (Ramipril) .... Take One Capsule Once Daily 3)  Doxazosin Mesylate 4 Mg  Tabs (Doxazosin Mesylate) .... Take 1 Tablet By Mouth Two Times A Day 4)  Vitamin C 500 Mg  Tabs (Ascorbic Acid) .... Take 1 Tablet By Mouth Two Times A Day 5)  Calcium 600 Mg  Tabs (Calcium) .Marland KitchenMarland KitchenMarland Kitchen 1  Once Daily 6)  Vytorin 10-40 Mg  Tabs (Ezetimibe-Simvastatin) .... Take One Tablet Once Daily 7)  Multivitamins   Tabs (Multiple Vitamin) .... Take One Tablet Once Daily 8)  Fish Oil 1000 Mg  Caps (Omega-3 Fatty Acids) .... Take One Capsule Twice Daily 9)  Loratadine 10 Mg  Tabs (Loratadine) .... Take One Tablet Once Daily 10)  Plavix 75 Mg  Tabs (Clopidogrel Bisulfate) .... Take One Tablet Once Daily 11)  Metoprolol Tartrate 50 Mg Tabs (Metoprolol Tartrate) .... Take One Tablet By Mouth Twice A Day 12)  Tracer Study Drug .... Take As Directed. 13)  Magnesium Oxide 400 Mg  Tabs (Magnesium Oxide) .... Take One Tablet Daily  Allergies: 1)  ! Sulfa  Past History:  Past Medical History: Last updated: 12/11/2008 AORTIC STENOSIS (ICD-424.1) CORONARY ARTERY BYPASS GRAFT, HX OF (ICD-V45.81) MYOCARDIAL INFARCTION, HX OF (ICD-412) PERIPHERAL VASCULAR  DISEASE (ICD-443.9) CORONARY ARTERY DISEASE (ICD-414.00) CAROTID ARTERY DISEASE (ICD-433.10) HYPERLIPIDEMIA (ICD-272.4) CEREBROVASCULAR DISEASE (ICD-437.9) RENAL ARTERY STENOSIS (ICD-440.1) PERCUTANEOUS TRANSLUMINAL CORONARY ANGIOPLASTY, HX OF (ICD-V45.82) Hx of SCARLET FEVER (ICD-034.1) * Hx of CYSTOSCOPY WITH BLADDER BIPOSY Hx of BLADDER CANCER (ICD-188.9) BENIGN PROSTATIC HYPERTROPHY, HX OF (ICD-V13.8) SLEEP APNEA (ICD-780.57)     Past Surgical History: Last updated: 12/11/2008 * Hx of CYSTOSCOPY WITH BLADDER BIPOSY PERCUTANEOUS TRANSLUMINAL CORONARY ANGIOPLASTY, HX OF (ICD-V45.82) CORONARY ARTERY BYPASS GRAFT, HX OF (ICD-V45.81)  Family History: Last updated: Dec 11, 2007 father-deceased @86 : CAD/MI w/ sudden death 1 wk post-MI mother-deceased @ 41: HTN, DM, Lipid, cardiomyopathy, CAD Neg- colon cancer Uncle Prostate cancer  Social History: Last updated: 2007-12-11 Lucita Ferrara, did some graduate work Married '52 1 son - '61; 2 daughters -'68, '63: grandchildren 7; great-grands 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 good marriage.  Risk Factors: Alcohol Use: <1 (01/05/2009) Caffeine Use: 2 cups (01/05/2009) Diet: heart healthy (01/05/2009) Exercise: yes (01/05/2009)  Risk Factors: Smoking Status: quit (01/05/2009)  Review of Systems       negative other than history of present illness  Vital Signs:  Patient profile:   77 year old male Height:      66 inches Weight:      161 pounds BMI:     26.08 Pulse rate:   56 / minute Pulse rhythm:   regular BP sitting:   132 / 62  (left arm) Cuff size:   large  Vitals Entered By: Scherrie Bateman, LPN (October 15, 1608 10:59 AM)  Physical Exam  General:  Well developed, well nourished, in no acute distress. Head:  normocephalic and atraumatic Eyes:  PERRLA/EOM intact; conjunctiva and lids normal. Neck:  Neck supple, no JVD. No masses, thyromegaly or abnormal cervical nodes. Chest Tannie Koskela:  no deformities or breast  masses noted Lungs:  Clear bilaterally to auscultation and percussion. Heart:  PMI not sustained and nondisplaced, systolic murmur consistent with aortic stenosis, thank splitting of S2 heard with inspiration. Bilateral carotid bruits versus referred sounds from the aortic valve Abdomen:  Bowel sounds positive; abdomen soft and non-tender without masses, organomegaly, or hernias noted. No hepatosplenomegaly. Msk:  Back normal, normal gait. Muscle strength and tone normal. Pulses:  pulses normal in all 4 extremities Extremities:  No clubbing or cyanosis. Neurologic:  Alert and oriented x 3. Skin:  Intact without lesions or rashes. Psych:  Normal affect.   Problems:  Medical Problems Added: 1)  Dx of Rbbb  (ICD-426.4)  Impression & Recommendations:  Problem # 1:  AORTIC STENOSIS (ICD-424.1) He is asymptomatic. Will obtain echocardiogram to assess aortic valve gradient.  No change in treatment. His updated medication list for this problem includes:    Altace 5 Mg Caps (Ramipril) .Marland Kitchen... Take one capsule once daily    Metoprolol Tartrate 50 Mg Tabs (Metoprolol tartrate) .Marland Kitchen... Take one tablet by mouth twice a day  Orders: Echocardiogram (Echo)  Problem # 2:  CORONARY ARTERY BYPASS GRAFT, HX OF (ICD-V45.81) Assessment: Unchanged  Orders: Nuclear Stress Test (Nuc Stress Test)  Problem # 3:  MYOCARDIAL INFARCTION, HX OF (ICD-412) Assessment: Unchanged  His updated medication list for this problem includes:    Aspirin Ec 325 Mg Tbec (Aspirin) .Marland Kitchen... Take 1 tablet by mouth once a day    Altace 5 Mg Caps (Ramipril) .Marland Kitchen... Take one capsule once daily    Plavix 75 Mg Tabs (Clopidogrel bisulfate) .Marland Kitchen... Take one tablet once daily    Metoprolol Tartrate 50 Mg Tabs (Metoprolol tartrate) .Marland Kitchen... Take one tablet by mouth twice a day  Problem # 4:  CAROTID ARTERY DISEASE (ICD-433.10)  I will obtain followup carotid Dopplers at the time of his echo. His updated medication list for this problem  includes:    Aspirin Ec 325 Mg Tbec (Aspirin) .Marland Kitchen... Take 1 tablet by mouth once a day    Plavix 75 Mg Tabs (Clopidogrel bisulfate) .Marland Kitchen... Take one tablet once daily  Orders: Carotid Duplex (Carotid Duplex)  Problem # 5:  PERIPHERAL VASCULAR DISEASE (ICD-443.9) Assessment: Unchanged  His updated medication list for this problem includes:    Altace 5 Mg Caps (Ramipril) .Marland Kitchen... Take one capsule once daily    Metoprolol Tartrate 50 Mg Tabs (Metoprolol tartrate) .Marland Kitchen... Take one tablet by mouth twice a day  Orders: Echocardiogram (Echo)  His updated medication list for this problem includes:    Altace 5 Mg Caps (Ramipril) .Marland Kitchen... Take one capsule once daily    Metoprolol Tartrate 50 Mg Tabs (Metoprolol tartrate) .Marland Kitchen... Take one tablet by mouth twice a day  Problem # 6:  RBBB (ICD-426.4) Assessment: Unchanged  His updated medication list for this problem includes:    Aspirin Ec 325 Mg Tbec (Aspirin) .Marland Kitchen... Take 1 tablet by mouth once a day    Altace 5 Mg Caps (Ramipril) .Marland Kitchen... Take one capsule once daily    Plavix 75 Mg Tabs (Clopidogrel bisulfate) .Marland Kitchen... Take one tablet once daily    Metoprolol Tartrate 50 Mg Tabs (Metoprolol tartrate) .Marland Kitchen... Take one tablet by mouth twice a day  Patient Instructions: 1)  Your physician recommends that you schedule a follow-up appointment in: 6 MONTHS WITH DR Tahiry Spicer 2)  Your physician recommends that you continue on your current medications as directed. Please refer to the Current Medication list given to you today. 3)  Your physician has requested that you have a carotid duplex. This test is an ultrasound of the carotid arteries in your neck. It looks at blood flow through these arteries that supply the brain with blood. Allow one hour for this exam. There are no restrictions or special instructions. 4)  Your physician has requested that you have an echocardiogram.  Echocardiography is a painless test that uses sound waves to create images of your heart. It provides  your doctor with information about the size and shape of your heart and how well your heart's chambers and valves are working.  This procedure takes approximately one hour. There are no restrictions for this procedure. 5)  Your physician has requested that you have an exercise stress myoview.  For further information please visit [email or web address]  Please follow instruction sheet, as given.

## 2010-08-16 NOTE — Assessment & Plan Note (Signed)
Summary: Cardiology Nuclear Study  Nuclear Med Background Indications for Stress Test: Evaluation for Ischemia, Graft Patency, Stent Patency   History: CABG, Echo, Heart Catheterization, Myocardial Infarction, Myocardial Perfusion Study, Stents  History Comments: "95 CABG x5. 4/08 MPS: NL, EF=59%. 12/08 NSTEMI>Cath: SVG>OM 95%>Stent , other grafts patent, EF=65-70%. 4/10 Echo: EF=60-65%, mod-severe AI.  Symptoms: Fatigue with Exertion, Palpitations    Nuclear Pre-Procedure Cardiac Risk Factors: Carotid Disease, Claudication, Family History - CAD, History of Smoking, Lipids, PVD, RBBB Caffeine/Decaff Intake: None NPO After: 6:00 PM Lungs: clear IV 0.9% NS with Angio Cath: 24g     IV Site: (R) Hand IV Started by: Irean Hong RN Chest Size (in) 40     Height (in): 66 Weight (lb): 155 BMI: 25.11 Tech Comments: Metoprolol held 24 hrs.  Nuclear Med Study 1 or 2 day study:  1 day     Stress Test Type:  Stress Reading MD:  Arvilla Meres, MD     Referring MD:  T.Wall Resting Radionuclide:  Technetium 19m Tetrofosmin     Resting Radionuclide Dose:  11 mCi  Stress Radionuclide:  Technetium 87m Tetrofosmin     Stress Radionuclide Dose:  33 mCi   Stress Protocol Exercise Time (min):  6:15 min     Max HR:  131 bpm     Predicted Max HR:  142 bpm  Max Systolic BP: 182 mm Hg     Percent Max HR:  92.25 %     METS: 7.40 Rate Pressure Product:  09811    Stress Test Technologist:  Milana Na EMT-P     Nuclear Technologist:  Domenic Polite CNMT  Rest Procedure  Myocardial perfusion imaging was performed at rest 45 minutes following the intravenous administration of Myoview Technetium 70m Tetrofosmin.  Stress Procedure  The patient exercised for 6:15. The patient stopped due to fatigue and denied any chest pain.  There were no significant ST-T wave changes and rare pvc/pac.  Myoview was injected at peak exercise and myocardial perfusion imaging was performed after a brief  delay.  QPS Raw Data Images:  Patient motion noted; appropriate software correction applied. Stress Images:  Decreased uptake in the base of the inferolateral and anterior walls. Rest Images:  Decreased uptake in the base of the inferolateral and anterior walls. Subtraction (SDS):  There is a fixed defect in the base of the inferolateral wall suggestive of previous infarct. There is also a mild fixed defect at the base of the anterior wall which appears to be attenuation artifact. No signifcant ischemia. Transient Ischemic Dilatation:  .99  (Normal <1.22)  Lung/Heart Ratio:  .30  (Normal <0.45)  Quantitative Gated Spect Images QGS EDV:  111 ml QGS ESV:  41 ml QGS EF:  63 % QGS cine images:  Normal  Findings Low risk nuclear study  Evidence for inferior infarct     Overall Impression  Exercise Capacity: Fair exercise capacity. BP Response: Normal blood pressure response. Clinical Symptoms: R leg pain. No CP. ECG Impression: No significant ST segment change suggestive of ischemia. Overall Impression: Low risk stress nuclear study. Overall Impression Comments: There is a fixed defect in the base of the inferolateral wall suggestive of previous infarct. There is also a mild fixed defect at the base of the anterior wall which appears to be attenuation artifact. No signifcant ischemia.  Appended Document: Cardiology Nuclear Study stable. Left message on home phone.  Appended Document: Cardiology Nuclear Study pt aware./cy

## 2010-08-16 NOTE — Progress Notes (Signed)
Summary: EAR PAIN   Phone Note Call from Patient Call back at Home Phone [phone]   Summary of Call: Pt c/o ear ache & "cold", Wife is req a call back.  Initial call taken by: Lamar Sprinkles, CMA,  June 08, 2010 11:30 AM  Follow-up for Phone Call        Spoke w/wife. Pt c/o "shooting" pains in his right ear, sinus congestion, drainage and dry cough  x 4 days. No fever. No drainage from ear. Pt just had valve replacement.   Pt will not take OTC meds. He has tried 2 advil but pain returns after 4 hrs and pt does not want to take any more meds per wife.   Follow-up by: Lamar Sprinkles, CMA,  June 08, 2010 11:50 AM  Additional Follow-up for Phone Call Additional follow up Details #1::        come in today 14:30 for a quick look Additional Follow-up by: Jacques Navy MD,  June 08, 2010 1:38 PM    Additional Follow-up for Phone Call Additional follow up Details #2::    Pt informed, scheduled Follow-up by: Lamar Sprinkles, CMA,  June 08, 2010 1:40 PM

## 2010-08-17 ENCOUNTER — Ambulatory Visit: Admit: 2010-08-17 | Payer: Self-pay | Admitting: Cardiology

## 2010-08-17 ENCOUNTER — Encounter (HOSPITAL_COMMUNITY): Payer: Medicare Other | Attending: Cardiology

## 2010-08-17 ENCOUNTER — Ambulatory Visit (INDEPENDENT_AMBULATORY_CARE_PROVIDER_SITE_OTHER): Payer: Medicare Other | Admitting: Cardiology

## 2010-08-17 ENCOUNTER — Encounter: Payer: Self-pay | Admitting: Cardiology

## 2010-08-17 DIAGNOSIS — I2510 Atherosclerotic heart disease of native coronary artery without angina pectoris: Secondary | ICD-10-CM | POA: Insufficient documentation

## 2010-08-17 DIAGNOSIS — Z954 Presence of other heart-valve replacement: Secondary | ICD-10-CM | POA: Insufficient documentation

## 2010-08-17 DIAGNOSIS — I359 Nonrheumatic aortic valve disorder, unspecified: Secondary | ICD-10-CM | POA: Insufficient documentation

## 2010-08-17 DIAGNOSIS — I10 Essential (primary) hypertension: Secondary | ICD-10-CM

## 2010-08-17 DIAGNOSIS — Z5189 Encounter for other specified aftercare: Secondary | ICD-10-CM | POA: Insufficient documentation

## 2010-08-17 DIAGNOSIS — G4733 Obstructive sleep apnea (adult) (pediatric): Secondary | ICD-10-CM | POA: Insufficient documentation

## 2010-08-17 DIAGNOSIS — Z951 Presence of aortocoronary bypass graft: Secondary | ICD-10-CM | POA: Insufficient documentation

## 2010-08-17 DIAGNOSIS — Z9861 Coronary angioplasty status: Secondary | ICD-10-CM | POA: Insufficient documentation

## 2010-08-17 DIAGNOSIS — Z87891 Personal history of nicotine dependence: Secondary | ICD-10-CM | POA: Insufficient documentation

## 2010-08-17 DIAGNOSIS — I739 Peripheral vascular disease, unspecified: Secondary | ICD-10-CM | POA: Insufficient documentation

## 2010-08-17 DIAGNOSIS — I252 Old myocardial infarction: Secondary | ICD-10-CM

## 2010-08-18 NOTE — Progress Notes (Signed)
   Phone Note Outgoing Call   Call placed by: Lisabeth Devoid RN,  July 20, 2010 5:47 PM Call placed to: Patient Summary of Call: COUGH  Follow-up for Phone Call        I spoke with Mr. Langhans who has c/o of dry cough.  "It comes and goes" and does not matter whether he is inside or outside.  He is not running a fever, denies, edema.  He is not on Altace.  He states that his BP has been "okay" at cardiac rehab. He would like Dr. Vern Claude advice about this. #2  Mr. Houdeshell would also like to know if it is okay to have a glass of wine or beer once in awhile.  I told him that would be fine and would also check with Dr. Daleen Squibb. Mylo Red RN

## 2010-08-18 NOTE — Miscellaneous (Signed)
Summary: [facility]   [facility]   Imported By: Roderic Ovens 07/19/2010 11:42:07  _____________________________________________________________________  External Attachment:    Type:   Image     Comment:   External Document

## 2010-08-18 NOTE — Letter (Signed)
Summary: [facility]  [facility]   Imported By: Sherian Rein 06/24/2010 11:12:43  _____________________________________________________________________  External Attachment:    Type:   Image     Comment:   External Document

## 2010-08-18 NOTE — Progress Notes (Signed)
  Phone Note Refill Request Message from:  Fax from Pharmacy on July 01, 2010 3:04 PM  pt is taking the zolpidem tartrate 10mg  I will add it back to his list. Please Advise refill  Initial call taken by: Ami Bullins CMA,  July 01, 2010 3:07 PM    New/Updated Medications: ZOLPIDEM TARTRATE 10 MG TABS (ZOLPIDEM TARTRATE) 1 tablet at bedtime as needed Prescriptions: ZOLPIDEM TARTRATE 10 MG TABS (ZOLPIDEM TARTRATE) 1 tablet at bedtime as needed  #30 x 12   Entered by:   Jacques Navy MD   Authorized by:   Bill Salinas CMA   Signed by:   Jacques Navy MD on 07/01/2010   Method used:   Telephoned to ...       [facility] (retail)       [address]       [place], [place]  [address]       Ph: [phone] or [phone]       Fax: [phone]   RxID:   [id number]

## 2010-08-18 NOTE — Miscellaneous (Signed)
Summary: [facility]   [facility]   Imported By: Roderic Ovens 07/28/2010 16:06:22  _____________________________________________________________________  External Attachment:    Type:   Image     Comment:   External Document

## 2010-08-18 NOTE — Progress Notes (Signed)
   Walk in Patient Form Recieved " Has had Dry Cough a long time,saw Dr.Norins & still has Cough.Is this common with the Surgery and Collapsed Lung? Please call so this can be discussed." Sent to [facility]  July 20, 2010 11:32 AM

## 2010-08-18 NOTE — Miscellaneous (Signed)
Summary: [facility]   [facility]   Imported By: Roderic Ovens 07/19/2010 11:41:45  _____________________________________________________________________  External Attachment:    Type:   Image     Comment:   External Document

## 2010-08-18 NOTE — Progress Notes (Signed)
   Phone Note Outgoing Call   Call placed by: Lisabeth Devoid RN,  June 27, 2010 4:20 PM Call placed to: Patient Summary of Call: Metoprolol & Altace  Follow-up for Phone Call        Returning call of Mr. Delisi.  He will restart Altace 5 mg daily.  He is also  on Metoprolol 50mg  two times a day instead of Metoprolol 25 mg two times a day and is this okay with Dr. Daleen Squibb.  I will forward this to Dr. Daleen Squibb for review. Mylo Red RN Follow-up by: Lisabeth Devoid RN,  June 27, 2010 4:24 PM     Appended Document:   Reviewed Juanito Doom, MD

## 2010-08-18 NOTE — Progress Notes (Signed)
   Phone Note Outgoing Call   Call placed by: Lisabeth Devoid RN,  June 27, 2010 11:39 AM Call placed to: Patient Summary of Call: altace  Follow-up for Phone Call        Pt wife is aware okay to continue Altace per Dr. Daleen Squibb [name] is at cardiac rehab this morning and she will give him the message. Mylo Red RN     Appended Document:   Reviewed Ricky Doom, MD

## 2010-08-19 ENCOUNTER — Encounter (HOSPITAL_COMMUNITY): Payer: Medicare Other

## 2010-08-22 ENCOUNTER — Encounter (HOSPITAL_COMMUNITY): Payer: Medicare Other

## 2010-08-24 ENCOUNTER — Encounter (HOSPITAL_COMMUNITY): Payer: Medicare Other

## 2010-08-24 NOTE — Assessment & Plan Note (Signed)
Summary: F3M/DFG/EP   Vital Signs:  Patient profile:   77 year old male Height:      66 inches Weight:      153.50 pounds BMI:     24.87 Pulse rate:   52 / minute Resp:     18 per minute BP sitting:   138 / 78  (left arm) Cuff size:   large  Vitals Entered By: Celestia Khat, CMA (August 17, 2010 8:54 AM)  Visit Type:  follow-up 3 mo  CC:  pt has no complaints today.  Current Medications (verified): 1)  Aspirin Ec 325 Mg  Tbec (Aspirin) .... Take 1 Tablet By Mouth Once A Day 2)  Doxazosin Mesylate 4 Mg  Tabs (Doxazosin Mesylate) .... 1/2 By Mouth Two Times A Day 3)  Vitamin C 500 Mg  Tabs (Ascorbic Acid) .... Take 1 Tablet By Mouth Two Times A Day 4)  Calcium 600 Mg  Tabs (Calcium) .Marland Kitchen.. 1 Once Daily 5)  Vytorin 10-40 Mg  Tabs (Ezetimibe-Simvastatin) .... Take One Tablet Once Daily 6)  Multivitamins   Tabs (Multiple Vitamin) .... Take One Tablet Once Daily 7)  Fish Oil 1000 Mg  Caps (Omega-3 Fatty Acids) .Marland Kitchen.. 1 Cap Three Times A Day 8)  Zyrtec Allergy 10 Mg Tabs (Cetirizine Hcl) .... 1/2 Tab Two Times A Day 9)  Plavix 75 Mg  Tabs (Clopidogrel Bisulfate) .... Take One Tablet Once Daily 10)  Metoprolol Tartrate 50 Mg Tabs (Metoprolol Tartrate) .... Take One Tablet Twice A Day 11)  Nitrostat 0.4 Mg Subl (Nitroglycerin) .Marland Kitchen.. 1 Tablet Under Tongue At Onset of Chest Pain; You May Repeat Every 5 Minutes For Up To 3 Doses.  Allergies (verified): 1)  ! Sulfa   Impression & Recommendations:  Problem # 1:  CORONARY ARTERY BYPASS GRAFT, HX OF (ICD-V45.81) Assessment Unchanged He remains on Plavix. His catheterization showed patent grafts and a patent stent to his marginal branch. This is a remote stent. He also has a stent in his left femoral artery. This was put in in the mid 2000. We will discontinue his Plavix and continue aspirin. I cleared this with Dr. Excell Seltzer.  Problem # 2:  AORTIC STENOSIS (ICD-424.1) Assessment: Improved status post bovine pericardial valve replacement The  following medications were removed from the medication list:    Altace 5 Mg Caps (Ramipril) .Marland Kitchen... Take one capsule once daily His updated medication list for this problem includes:    Metoprolol Tartrate 50 Mg Tabs (Metoprolol tartrate) .Marland Kitchen... Take one tablet twice a day    Nitrostat 0.4 Mg Subl (Nitroglycerin) .Marland Kitchen... 1 tablet under tongue at onset of chest pain; you may repeat every 5 minutes for up to 3 doses.  Problem # 3:  MYOCARDIAL INFARCTION, HX OF (ICD-412) Assessment: Unchanged  The following medications were removed from the medication list:    Altace 5 Mg Caps (Ramipril) .Marland Kitchen... Take one capsule once daily    Plavix 75 Mg Tabs (Clopidogrel bisulfate) .Marland Kitchen... Take one tablet once daily His updated medication list for this problem includes:    Aspirin Ec 325 Mg Tbec (Aspirin) .Marland Kitchen... Take 1 tablet by mouth once a day    Metoprolol Tartrate 50 Mg Tabs (Metoprolol tartrate) .Marland Kitchen... Take one tablet twice a day    Nitrostat 0.4 Mg Subl (Nitroglycerin) .Marland Kitchen... 1 tablet under tongue at onset of chest pain; you may repeat every 5 minutes for up to 3 doses.  Problem # 4:  PERIPHERAL VASCULAR DISEASE (ICD-443.9) Assessment: Unchanged He is having minimal  claudication in his right calf. He has excellent pulses. We'll continue with current therapy.  Problem # 5:  RBBB (ICD-426.4) Assessment: Unchanged  The following medications were removed from the medication list:    Altace 5 Mg Caps (Ramipril) .Marland Kitchen... Take one capsule once daily    Plavix 75 Mg Tabs (Clopidogrel bisulfate) .Marland Kitchen... Take one tablet once daily His updated medication list for this problem includes:    Aspirin Ec 325 Mg Tbec (Aspirin) .Marland Kitchen... Take 1 tablet by mouth once a day    Metoprolol Tartrate 50 Mg Tabs (Metoprolol tartrate) .Marland Kitchen... Take one tablet twice a day    Nitrostat 0.4 Mg Subl (Nitroglycerin) .Marland Kitchen... 1 tablet under tongue at onset of chest pain; you may repeat every 5 minutes for up to 3 doses.  Problem # 6:  CAROTID ARTERY  DISEASE (ICD-433.10) Assessment: Unchanged  The following medications were removed from the medication list:    Plavix 75 Mg Tabs (Clopidogrel bisulfate) .Marland Kitchen... Take one tablet once daily His updated medication list for this problem includes:    Aspirin Ec 325 Mg Tbec (Aspirin) .Marland Kitchen... Take 1 tablet by mouth once a day  Problem # 7:  HYPERLIPIDEMIA (ICD-272.4)  His updated medication list for this problem includes:    Vytorin 10-40 Mg Tabs (Ezetimibe-simvastatin) .Marland Kitchen... Take one tablet once daily  Problem # 8:  RENAL ARTERY STENOSIS (ICD-440.1) Assessment: Unchanged Catheterization demonstrated a 95% right renal artery stenosis with an atrophic kidney. He is a 50% his left renal. Renal function is stable. His blood pressure remains good off of Altace. Will keep off this for now.  Patient Instructions: 1)  Your physician recommends that you schedule a follow-up appointment in:  2)  Your physician has recommended you make the following change in your medication: stop plavix and altace  Appended Document: [facility]      Primary Provider:  Jacques Navy MD   History of Present Illness: Mr Voit returns today for followup. He is now nearly 4 months out from aortic valve replacement. He is having no symptoms of chest Keeleigh Terris pain, angina, dyspnea on exertion. He is anxious to get back to doing yard work in March. I have cleared him for that.  His blood pressures been remarkably good off of Altace. He is a 95% stenosis in his right renal artery. He has a 50% in his left renal artery. We'll keep off of the ACE inhibitor.  He is also on Plavix and aspirin. He's had remote stenting of an obtuse marginal vein graft which was open at the time of his presurgical catheter. he also had a stent placed in the left lower extremity 2005. He has minimal claudication in the right lower extremity.  Allergies: 1)  ! 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 good marriage.  Risk Factors: Alcohol Use: <1 (01/05/2009) Caffeine Use: 2 cups (01/05/2009) Diet: heart healthy (01/05/2009) Exercise: yes (01/05/2009)  Risk Factors: Smoking Status: quit (01/05/2009)  Clinical Reports Reviewed:  Cardiac Cath:  03/14/2010: Cardiac Cath Findings:  Assessment: 1. Coronary artery disease with all grafts patent. 2. Normal Left ventricular function with a highly calcified aortic valve. 3. Moderate aortic stenosis by catherterization. Severe by echo. 4. Peripheral arterial disease with apparent  high-grade right renal artery stenosis.  Arvilla Meres, MD  06/20/2007: Cardiac Cath Findings:   ANGIOGRAPHIC DATA:  The vein graft prior to intervention demonstrated   90% stenosis and afterwards 0%.  No complications were noted.      DISPOSITION:  The patient will be treated with aspirin and Plavix,   continue to follow up with Dr. Daleen Squibb with risk factor reduction.            Arturo Morton. Riley Kill, MD, [facility]   Electronically Signed     Cardiac Cath Findings:   Left ventriculogram done in the RAO position showed an ejection fraction   of 65-70% with no Antrice Pal motion abnormalities.  No significant mitral   regurgitation.      ASSESSMENT:   1. Three vessel native coronary artery disease.   2. LIMA to the LAD is patent.  However, the distal LAD fills mainly       through the native vessel.   3. Saphenous vein graft to the diagonal is okay.  Saphenous vein graft       to the PDA with a questionable sequential graft to the posterior       lateral is okay.  However, it is unclear if the distal limb is       patent or not, or whether or not this is native vessel.   4. Saphenous vein graft to the OM has a ruptured plaque with a 95%       stenosis proximally.   5. Normal LV function.   6. Question of mild aortic stenosis on pullback across the aortic       valve.      PLAN:  Percutaneous intervention from the saphenous vein graft to the OM   with Dr. Riley Kill.  Would also check a 2D echocardiogram to evaluate the   aortic valve.               Bevelyn Buckles. Bensimhon, MD   Electronically Signed      08/25/1993: Cardiac Cath Findings:  Final Diagnosis: 1) Well preserved left ventricular function 2) Multivessel coronary artery disease.  Shawnie Pons, MD  Carotid Doppler:  11/04/2009:  Impressions: Stable, moderate carotid artery disease, bilaterally. 60-79% bilateral ICA Stenoses.  Charlton Haws, MD  10/26/2009:  Impressions: Stable, moderate carotid artery disease,  bilaterally. 60-79% bilateral ICA stenosis.  Charlton Haws, MD  05/19/2009:  Impressions: Essentially stable, moderate carotid artery disease, bilaterally. 60-79% bilateral ICA stenoses.  Charlton Haws, MD  05/14/2008:  Impressions: Stable, bilateral carotid disease Elevated velocities in the right subclavian artery. 60-79% RICA stenosis 40-59% LICA stenosis  Burna Cash  05/14/2007:  Impressions: Essentially stable carotid artery disease, bilaterally 60-79% RICA stenosis 40-59% LICA stenosis  Tonny Bollman, MD   Review of Systems       negative other than history of present illness  Physical Exam  General:  Well developed, well nourished, in no acute distress. Head:  normocephalic and atraumatic Eyes:  PERRLA/EOM intact; conjunctiva and lids normal. Neck:  Neck supple, no JVD. No masses, thyromegaly or abnormal cervical nodes. Chest Lashonda Sonneborn:  well-healed median sternotomy Lungs:  Clear bilaterally to auscultation and percussion. Heart:  PMI nondisplaced, regular rate and rhythm, normal S1-S2, soft systolic murmur split S2, no diastolic component, soft carotid bruits Msk:  Back normal, normal gait. Muscle strength and tone normal. Pulses:  pulses normal in all 4 extremities Extremities:  No clubbing or cyanosis. Neurologic:  Alert and oriented x 3. Skin:  Intact without lesions or rashes. Psych:  Normal affect.

## 2010-08-26 ENCOUNTER — Encounter (HOSPITAL_COMMUNITY): Payer: Medicare Other

## 2010-08-29 ENCOUNTER — Encounter (HOSPITAL_COMMUNITY): Payer: Medicare Other

## 2010-08-31 ENCOUNTER — Encounter (HOSPITAL_COMMUNITY): Payer: Medicare Other

## 2010-09-02 ENCOUNTER — Encounter (HOSPITAL_COMMUNITY): Payer: Medicare Other

## 2010-09-05 ENCOUNTER — Encounter (HOSPITAL_COMMUNITY): Payer: Medicare Other

## 2010-09-07 ENCOUNTER — Encounter (HOSPITAL_COMMUNITY): Payer: Medicare Other

## 2010-09-09 ENCOUNTER — Encounter (HOSPITAL_COMMUNITY): Payer: Medicare Other

## 2010-09-12 ENCOUNTER — Encounter (HOSPITAL_COMMUNITY): Payer: Medicare Other

## 2010-09-14 ENCOUNTER — Encounter (HOSPITAL_COMMUNITY): Payer: Medicare Other

## 2010-09-16 ENCOUNTER — Encounter (HOSPITAL_COMMUNITY): Payer: Medicare Other

## 2010-09-19 ENCOUNTER — Encounter (HOSPITAL_COMMUNITY): Payer: Medicare Other

## 2010-09-21 ENCOUNTER — Encounter (HOSPITAL_COMMUNITY): Payer: Medicare Other

## 2010-09-23 ENCOUNTER — Encounter (HOSPITAL_COMMUNITY): Payer: Medicare Other

## 2010-09-26 ENCOUNTER — Encounter (HOSPITAL_COMMUNITY): Payer: Medicare Other

## 2010-09-28 ENCOUNTER — Encounter (HOSPITAL_COMMUNITY): Payer: Medicare Other

## 2010-09-28 LAB — POCT I-STAT, CHEM 8
BUN: 20 mg/dL (ref 6–23)
Calcium, Ion: 1.05 mmol/L — ABNORMAL LOW (ref 1.12–1.32)
Calcium, Ion: 1.09 mmol/L — ABNORMAL LOW (ref 1.12–1.32)
Chloride: 105 mEq/L (ref 96–112)
Creatinine, Ser: 1.2 mg/dL (ref 0.4–1.5)
Glucose, Bld: 138 mg/dL — ABNORMAL HIGH (ref 70–99)
Glucose, Bld: 82 mg/dL (ref 70–99)
HCT: 24 % — ABNORMAL LOW (ref 39.0–52.0)
Hemoglobin: 8.2 g/dL — ABNORMAL LOW (ref 13.0–17.0)
Potassium: 4.5 mEq/L (ref 3.5–5.1)
TCO2: 20 mmol/L (ref 0–100)

## 2010-09-28 LAB — POCT I-STAT 4, (NA,K, GLUC, HGB,HCT)
Glucose, Bld: 113 mg/dL — ABNORMAL HIGH (ref 70–99)
Glucose, Bld: 116 mg/dL — ABNORMAL HIGH (ref 70–99)
Glucose, Bld: 99 mg/dL (ref 70–99)
HCT: 25 % — ABNORMAL LOW (ref 39.0–52.0)
HCT: 26 % — ABNORMAL LOW (ref 39.0–52.0)
HCT: 31 % — ABNORMAL LOW (ref 39.0–52.0)
Hemoglobin: 10.5 g/dL — ABNORMAL LOW (ref 13.0–17.0)
Potassium: 4.1 mEq/L (ref 3.5–5.1)
Potassium: 4.3 mEq/L (ref 3.5–5.1)
Potassium: 5.4 mEq/L — ABNORMAL HIGH (ref 3.5–5.1)
Potassium: 5.6 mEq/L — ABNORMAL HIGH (ref 3.5–5.1)
Sodium: 133 mEq/L — ABNORMAL LOW (ref 135–145)
Sodium: 134 mEq/L — ABNORMAL LOW (ref 135–145)
Sodium: 142 mEq/L (ref 135–145)

## 2010-09-28 LAB — CBC
HCT: 25 % — ABNORMAL LOW (ref 39.0–52.0)
HCT: 26.8 % — ABNORMAL LOW (ref 39.0–52.0)
Hemoglobin: 10.7 g/dL — ABNORMAL LOW (ref 13.0–17.0)
Hemoglobin: 8.8 g/dL — ABNORMAL LOW (ref 13.0–17.0)
MCH: 30.1 pg (ref 26.0–34.0)
MCH: 30.3 pg (ref 26.0–34.0)
MCHC: 34.3 g/dL (ref 30.0–36.0)
MCHC: 34.6 g/dL (ref 30.0–36.0)
MCHC: 35.1 g/dL (ref 30.0–36.0)
MCV: 86.9 fL (ref 78.0–100.0)
MCV: 87.6 fL (ref 78.0–100.0)
MCV: 88 fL (ref 78.0–100.0)
Platelets: 83 10*3/uL — ABNORMAL LOW (ref 150–400)
Platelets: 88 10*3/uL — ABNORMAL LOW (ref 150–400)
Platelets: 93 10*3/uL — ABNORMAL LOW (ref 150–400)
RBC: 2.84 MIL/uL — ABNORMAL LOW (ref 4.22–5.81)
RDW: 13.6 % (ref 11.5–15.5)
RDW: 13.7 % (ref 11.5–15.5)
RDW: 14.1 % (ref 11.5–15.5)
RDW: 14.9 % (ref 11.5–15.5)
WBC: 8 10*3/uL (ref 4.0–10.5)
WBC: 8.4 10*3/uL (ref 4.0–10.5)

## 2010-09-28 LAB — BASIC METABOLIC PANEL
BUN: 20 mg/dL (ref 6–23)
BUN: 22 mg/dL (ref 6–23)
BUN: 23 mg/dL (ref 6–23)
BUN: 25 mg/dL — ABNORMAL HIGH (ref 6–23)
CO2: 22 mEq/L (ref 19–32)
CO2: 23 mEq/L (ref 19–32)
Calcium: 7.5 mg/dL — ABNORMAL LOW (ref 8.4–10.5)
Calcium: 8.1 mg/dL — ABNORMAL LOW (ref 8.4–10.5)
Calcium: 8.2 mg/dL — ABNORMAL LOW (ref 8.4–10.5)
Chloride: 112 mEq/L (ref 96–112)
Creatinine, Ser: 1.59 mg/dL — ABNORMAL HIGH (ref 0.4–1.5)
Creatinine, Ser: 1.6 mg/dL — ABNORMAL HIGH (ref 0.4–1.5)
Creatinine, Ser: 1.63 mg/dL — ABNORMAL HIGH (ref 0.4–1.5)
GFR calc non Af Amer: 42 mL/min — ABNORMAL LOW (ref >60)
GFR calc non Af Amer: 43 mL/min — ABNORMAL LOW (ref >60)
Glucose, Bld: 106 mg/dL — ABNORMAL HIGH (ref 70–99)
Glucose, Bld: 109 mg/dL — ABNORMAL HIGH (ref 70–99)
Potassium: 4.1 mEq/L (ref 3.5–5.1)
Sodium: 140 mEq/L (ref 135–145)
Sodium: 144 mEq/L (ref 135–145)

## 2010-09-28 LAB — GLUCOSE, CAPILLARY
Glucose-Capillary: 105 mg/dL — ABNORMAL HIGH (ref 70–99)
Glucose-Capillary: 123 mg/dL — ABNORMAL HIGH (ref 70–99)
Glucose-Capillary: 129 mg/dL — ABNORMAL HIGH (ref 70–99)
Glucose-Capillary: 131 mg/dL — ABNORMAL HIGH (ref 70–99)
Glucose-Capillary: 135 mg/dL — ABNORMAL HIGH (ref 70–99)
Glucose-Capillary: 136 mg/dL — ABNORMAL HIGH (ref 70–99)
Glucose-Capillary: 138 mg/dL — ABNORMAL HIGH (ref 70–99)
Glucose-Capillary: 97 mg/dL (ref 70–99)

## 2010-09-28 LAB — POCT I-STAT 3, VENOUS BLOOD GAS (G3P V)
Acid-base deficit: 4 mmol/L — ABNORMAL HIGH (ref 0.0–2.0)
TCO2: 23 mmol/L (ref 0–100)
pH, Ven: 7.326 — ABNORMAL HIGH (ref 7.250–7.300)

## 2010-09-28 LAB — PREPARE PLATELETS

## 2010-09-28 LAB — POCT I-STAT 3, ART BLOOD GAS (G3+)
Acid-base deficit: 4 mmol/L — ABNORMAL HIGH (ref 0.0–2.0)
Bicarbonate: 20.9 mEq/L (ref 20.0–24.0)
Bicarbonate: 21 mEq/L (ref 20.0–24.0)
O2 Saturation: 100 %
O2 Saturation: 95 %
O2 Saturation: 95 %
O2 Saturation: 96 %
Patient temperature: 35.4
TCO2: 22 mmol/L (ref 0–100)
TCO2: 22 mmol/L (ref 0–100)
TCO2: 24 mmol/L (ref 0–100)
pCO2 arterial: 33.1 mmHg — ABNORMAL LOW (ref 35.0–45.0)
pCO2 arterial: 38 mmHg (ref 35.0–45.0)
pCO2 arterial: 40.2 mmHg (ref 35.0–45.0)
pH, Arterial: 7.392 (ref 7.350–7.450)
pO2, Arterial: 84 mmHg (ref 80.0–100.0)

## 2010-09-28 LAB — PLATELET COUNT: Platelets: 88 10*3/uL — ABNORMAL LOW (ref 150–400)

## 2010-09-28 LAB — APTT: aPTT: 36 seconds (ref 24–37)

## 2010-09-28 LAB — CREATININE, SERUM
Creatinine, Ser: 1.43 mg/dL (ref 0.4–1.5)
GFR calc non Af Amer: 48 mL/min — ABNORMAL LOW (ref >60)

## 2010-09-28 LAB — MAGNESIUM: Magnesium: 3.1 mg/dL — ABNORMAL HIGH (ref 1.5–2.5)

## 2010-09-29 ENCOUNTER — Telehealth: Payer: Self-pay | Admitting: Cardiology

## 2010-09-29 LAB — BLOOD GAS, ARTERIAL
Acid-base deficit: 1.8 mmol/L (ref 0.0–2.0)
Bicarbonate: 22.1 mEq/L (ref 20.0–24.0)
O2 Saturation: 98.4 %
TCO2: 23.1 mmol/L (ref 0–100)
pCO2 arterial: 35 mmHg (ref 35.0–45.0)
pO2, Arterial: 106 mmHg — ABNORMAL HIGH (ref 80.0–100.0)

## 2010-09-29 LAB — CBC
HCT: 38.3 % — ABNORMAL LOW (ref 39.0–52.0)
MCH: 30.7 pg (ref 26.0–34.0)
MCHC: 35 g/dL (ref 30.0–36.0)
RDW: 13.5 % (ref 11.5–15.5)

## 2010-09-29 LAB — POCT I-STAT 3, VENOUS BLOOD GAS (G3P V)
Acid-base deficit: 4 mmol/L — ABNORMAL HIGH (ref 0.0–2.0)
TCO2: 25 mmol/L (ref 0–100)
pCO2, Ven: 42.6 mmHg — ABNORMAL LOW (ref 45.0–50.0)
pCO2, Ven: 43.3 mmHg — ABNORMAL LOW (ref 45.0–50.0)
pH, Ven: 7.339 — ABNORMAL HIGH (ref 7.250–7.300)
pO2, Ven: 37 mmHg (ref 30.0–45.0)

## 2010-09-29 LAB — URINALYSIS, ROUTINE W REFLEX MICROSCOPIC
Bilirubin Urine: NEGATIVE
Glucose, UA: NEGATIVE mg/dL
Hgb urine dipstick: NEGATIVE
Specific Gravity, Urine: 1.006 (ref 1.005–1.030)
pH: 7 (ref 5.0–8.0)

## 2010-09-29 LAB — TYPE AND SCREEN: ABO/RH(D): A NEG

## 2010-09-29 LAB — HEMOGLOBIN A1C: Hgb A1c MFr Bld: 5.5 % (ref <5.7)

## 2010-09-29 LAB — COMPREHENSIVE METABOLIC PANEL
ALT: 18 U/L (ref 0–53)
Calcium: 9.1 mg/dL (ref 8.4–10.5)
Creatinine, Ser: 1.38 mg/dL (ref 0.4–1.5)
Glucose, Bld: 106 mg/dL — ABNORMAL HIGH (ref 70–99)
Sodium: 141 mEq/L (ref 135–145)
Total Protein: 6.1 g/dL (ref 6.0–8.3)

## 2010-09-29 LAB — POCT I-STAT 3, ART BLOOD GAS (G3+)
Acid-base deficit: 2 mmol/L (ref 0.0–2.0)
O2 Saturation: 91 %
TCO2: 25 mmol/L (ref 0–100)
pCO2 arterial: 43.1 mmHg (ref 35.0–45.0)
pH, Arterial: 7.349 — ABNORMAL LOW (ref 7.350–7.450)
pO2, Arterial: 66 mmHg — ABNORMAL LOW (ref 80.0–100.0)

## 2010-09-29 LAB — SURGICAL PCR SCREEN: MRSA, PCR: NEGATIVE

## 2010-09-29 LAB — ABO/RH: ABO/RH(D): A NEG

## 2010-09-29 LAB — PROTIME-INR
INR: 1 (ref 0.00–1.49)
Prothrombin Time: 13.4 seconds (ref 11.6–15.2)

## 2010-09-29 LAB — APTT: aPTT: 26 seconds (ref 24–37)

## 2010-09-30 ENCOUNTER — Encounter (HOSPITAL_COMMUNITY): Payer: Medicare Other

## 2010-10-03 ENCOUNTER — Encounter (HOSPITAL_COMMUNITY): Payer: Medicare Other

## 2010-10-04 NOTE — Progress Notes (Signed)
  Medications Added AMOXICILLIN 500 MG CAPS (AMOXICILLIN) take 4 capsules ( 2 grams) by mouth 1 hour before surgical procedure       Phone Note Outgoing Call   Call placed by: Lisabeth Devoid RN,  September 29, 2010 5:58 PM Call placed to: Patient Summary of Call: premedicate prior to melanoma removal  Follow-up for Phone Call        I spoke with pt and wife about premedication question prior to deep tissue melanoma removal next week. Per Dr. Daleen Squibb pt will take 2 gm amoxicillin 1 hour before procedure.  This was also what pt had taken for dental procedure in November. Follow-up by: Lisabeth Devoid RN,  September 29, 2010 6:00 PM    New/Updated Medications: AMOXICILLIN 500 MG CAPS (AMOXICILLIN) take 4 capsules ( 2 grams) by mouth 1 hour before surgical procedure Prescriptions: AMOXICILLIN 500 MG CAPS (AMOXICILLIN) take 4 capsules ( 2 grams) by mouth 1 hour before surgical procedure  #4 x 0   Entered by:   Lisabeth Devoid RN   Authorized by:   Gaylord Shih, MD, [facility]   Signed by:   Lisabeth Devoid RN on 09/29/2010   Method used:   Electronically to        [facility] [address] (retail)       [address]       [place], [place]  [address]       Ph: [phone] or [phone]       Fax: [phone]   RxID:   [id number]

## 2010-10-05 ENCOUNTER — Encounter (HOSPITAL_COMMUNITY): Payer: Medicare Other

## 2010-10-07 ENCOUNTER — Encounter (HOSPITAL_COMMUNITY): Payer: Medicare Other

## 2010-10-10 ENCOUNTER — Encounter (HOSPITAL_COMMUNITY): Payer: Medicare Other

## 2010-10-12 ENCOUNTER — Encounter (HOSPITAL_COMMUNITY): Payer: Medicare Other

## 2010-10-14 ENCOUNTER — Encounter (HOSPITAL_COMMUNITY): Payer: Medicare Other

## 2010-10-17 ENCOUNTER — Encounter (HOSPITAL_COMMUNITY): Payer: Medicare Other

## 2010-10-19 ENCOUNTER — Encounter (HOSPITAL_COMMUNITY): Payer: Medicare Other

## 2010-10-21 ENCOUNTER — Encounter (HOSPITAL_COMMUNITY): Payer: Medicare Other

## 2010-10-24 ENCOUNTER — Encounter (HOSPITAL_COMMUNITY): Payer: Medicare Other

## 2010-10-26 ENCOUNTER — Encounter (HOSPITAL_COMMUNITY): Payer: Medicare Other

## 2010-10-28 ENCOUNTER — Encounter (HOSPITAL_COMMUNITY): Payer: Medicare Other

## 2010-10-31 ENCOUNTER — Encounter (HOSPITAL_COMMUNITY): Payer: Medicare Other

## 2010-11-02 ENCOUNTER — Encounter (HOSPITAL_COMMUNITY): Payer: Medicare Other

## 2010-11-04 ENCOUNTER — Encounter (HOSPITAL_COMMUNITY): Payer: Medicare Other

## 2010-11-07 ENCOUNTER — Encounter (HOSPITAL_COMMUNITY): Payer: Medicare Other

## 2010-11-09 ENCOUNTER — Other Ambulatory Visit: Payer: Self-pay | Admitting: *Deleted

## 2010-11-09 ENCOUNTER — Telehealth: Payer: Self-pay | Admitting: Cardiology

## 2010-11-09 MED ORDER — NITROGLYCERIN 0.4 MG SL SUBL: 0.4000 mg | SUBLINGUAL_TABLET | SUBLINGUAL | Status: DC | PRN

## 2010-11-09 MED ORDER — AMOXICILLIN 500 MG PO CAPS: ORAL_CAPSULE | ORAL | Status: DC

## 2010-11-09 NOTE — Telephone Encounter (Signed)
Walk In Pt Form " [place] going [place] needs Amoxcillin [name]" sent to [facility] 11/09/10/km

## 2010-11-21 ENCOUNTER — Other Ambulatory Visit: Payer: Self-pay | Admitting: *Deleted

## 2010-11-21 MED ORDER — EZETIMIBE-SIMVASTATIN 10-40 MG PO TABS: 1.0000 | ORAL_TABLET | Freq: Every day | ORAL | Status: DC

## 2010-11-22 ENCOUNTER — Telehealth: Payer: Self-pay | Admitting: *Deleted

## 2010-11-22 NOTE — Telephone Encounter (Signed)
Pt is going out of the country and requesting RX's for amoxicillin and eye drops for pink eye to have on hand in case of infection. 

## 2010-11-23 MED ORDER — CIPROFLOXACIN HCL 500 MG PO TABS: ORAL_TABLET | ORAL | Status: AC

## 2010-11-23 MED ORDER — GENTAMICIN SULFATE 0.3 % OP SOLN: OPHTHALMIC | Status: AC

## 2010-11-23 NOTE — Telephone Encounter (Signed)
cipro 500mg  is my preferred travel antibiotic: 1 po bid x 5 days for traveler's diarrhea; 1 po bid x 7 days or respiratory infections. They may have #14 each = 28. Would not start any eye drops unless they have an outbreak of pink-eye. It is ok to take gentamic opthalmic drops: 2 qtts to affected eye(s) q 4 hours first 24 hours then qid for a total of 7 days. This is usually a 10 ml bottle.

## 2010-11-23 NOTE — Telephone Encounter (Signed)
Left vm for pt to call office back, Rx's sent to pharm.

## 2010-11-24 NOTE — Telephone Encounter (Signed)
Pts wife informed.

## 2010-11-29 NOTE — Assessment & Plan Note (Signed)
[facility]                            CARDIOLOGY OFFICE NOTE   [name], [name]                       MRN:          [id number]  DATE:10/22/2007                            DOB:          [birth year]     [name] returns today for manage the following issues.  1. Coronary artery disease.  He is status post a non-ST-elevation MI      June 22, 2007, with a stent to the saphenous vein graft and      obtuse marginal which was a bare metal stent.  He is having no      angina or ischemia.  He is due a stress test.  2. Mild aortic stenosis.   He remains extremely active.  He is not having any complaints including  angina, significant dyspnea on exertion, orthopnea, PND.  His wife says  he just lazy.   He also has nonobstructive carotid disease, last Doppler May 14, 2007.   MEDICATIONS:  1. Altace 5 mg a day.  2. Vitamin C 1000 mg a day.  3. Calcium 1200 mg a day.  4. Vytorin 110/40 nightly.  5 . Omega 3,  2000 mg daily.  1. Multivitamin daily.  2. Loratadine 10 mg a day.  3. Aspirin 325 mg a day.  4. Plavix 75 mg a day.  5. Metoprolol 25 mg a day.  11 . Magnesium 400 mg a day.  1. Doxazosin 8 mg a day.   PHYSICAL EXAMINATION:  VITAL SIGNS:  His blood pressure today is 161/65.  His pulse is 49 and regular.  Weight is 158.  HEENT:  Normocephalic, atraumatic.  PERRLA.  Extraocular movements  intact.  Sclera clear.  Face symmetry is normal.  NECK:  Carotid upstrokes are equal bilaterally with soft systolic sound  on the right.  Thyroid is not enlarged.  Trachea is midline.  LUNGS:  Clear.  HEART:  Reveals a systolic murmur with normal S1-S2 that moves that  splits appropriately.  His murmur is consistent with aortic stenosis.  ABDOMEN:  Soft, good bowel sounds.  No midline bruit.  No hepatomegaly.  EXTREMITIES:  No sinus clubbing or edema.  Pulses are intact.   His electrocardiogram shows marked sinus bradycardia with a right bundle  which is old.   ASSESSMENT AND PLAN:  Ms. Longest is doing well.  We will arrange for him  to have an exercise rest stress Myoview.  If this is negative for  ischemia, we will see him back again in 6 months.     Thomas C. Daleen Squibb, MD, [facility]  Electronically Signed    TCW/MedQ  DD: 10/22/2007  DT: 10/22/2007  Job #: [id number]

## 2010-11-29 NOTE — Cardiovascular Report (Signed)
NAME:  [name], [name] NO.:  [id number]   MEDICAL RECORD NO.:  [id number]          PATIENT TYPE:  INP   LOCATION:  2908                         FACILITY:  [facility]   PHYSICIAN:  Arturo Morton. Riley Kill, MD, FACCDATE OF BIRTH:  04-04-1929   DATE OF PROCEDURE:  06/20/2007  DATE OF DISCHARGE:                            CARDIAC CATHETERIZATION   CARDIAC CATHETERIZATION   INDICATIONS:  Mr. Bentson is a 77 year old gentleman with prior  revascularization surgery.  He has also had peripheral vascular  intervention.  The current study was done after the patient presented  with acute MI that was non-ST-elevation.  His pain resolved yesterday  and he has been treated with heparin.  He was brought to the  catheterization laboratory for further evaluation.  Catheterization  study was done by Dr. Gala Romney.  It demonstrated a thrombotic lesion in  the proximal to midportion of the saphenous vein graft to the OM.  Percutaneous intervention was recommended.  He was taken back to the  holding area.  He had been given 300 mg of clopidogrel and he had had  chewable aspirin earlier in the morning.   PROCEDURE:  Percutaneous stenting of the saphenous vein graft to the  obtuse marginal.   DESCRIPTION OF THE PROCEDURE:  The patient was brought to the  catheterization laboratory.  Using double glove technique, some  lidocaine was infused into the right femoral area and the existing 6-  [place] sheath was exchanged for a 6-French sheath.  ________was given  according to protocol and ACT checked and found to be appropriate for  PCI.  A right bypass catheter with side-holes was utilized.  This  provided reasonably good seating.  We crossed with a Prowater wire after  adequate anticoagulation was obtained.  Following this a 4 mm Spider  catheter was placed distally in the vein graft.  Following this, a  predilatation was done with a 2.5 mm x 12 Maverick balloon, and then  subsequently the vessel  was stented with a 2.75 x 18 Vision nondrug-  eluting platform.  This was taken to 16 atmospheres.  There was marked  improvement in the appearance of the vessel.  There was no chest pain.  Intracoronary verapamil was administered.  A retrieval catheter was then  utilized to remove the Spider catheter and atheromatous debris was  retracted in the Spider catheter after its removal and visualization on  the table.  Final angiographic views were obtained.  There was marked  improvement in the appearance of the vessel.  There was reduction in the  stenosis from 90% to 0% with a good angiographic appearance.  There was  very slight haziness along the edges of the stent compatible with  residual atheromatous debris.  There was excellent TIMI III runoff into  the distal microcirculation.   ANGIOGRAPHIC DATA:  The vein graft prior to intervention demonstrated  90% stenosis and afterwards 0%.  No complications were noted.   DISPOSITION:  The patient will be treated with aspirin and Plavix,  continue to follow up with Dr. Daleen Squibb with risk factor reduction.  Arturo Morton. Riley Kill, MD, [facility]  Electronically Signed    TDS/MEDQ  D:  06/20/2007  T:  06/21/2007  Job:  [id number]

## 2010-11-29 NOTE — Assessment & Plan Note (Signed)
[facility]                             PULMONARY OFFICE NOTE   THEODORE, VIRGIN                       MRN:          [id number]  DATE:03/26/2007                            DOB:          1929-04-08    I saw Mr. Alkire today for follow up of his severe sleep apnea.   He had undergone a CPAP titration study on December 21, 2006.  He was  titrated to a CPAP pressure setting of 12 with reduction in his  apnea/hypopnea index to 0.  He was observed in both REM sleep and supine  sleep in this pressure setting.   He has since been started on CPAP at 12.  He was originally tried on a  small size full face mask which he says he felt like he had his best  sleep with, but then he started to develop problems over the bridge of  his nose.  He was then changed to a medium size full face mask, but had  problems with air leak and as a result was changed to a nasal mask.  He  is using a humidifier.   He says that his wife has noticed that he is breathing much better at  night.  He no longer snores and he is sleeping more restfully.  He goes  to sleep at around 10:30.  He falls asleep fairly quickly.  He uses a  CPAP machine through the entire time that he is asleep at night.  He  wakes up between 5:00-6:00 in the morning.  He says that recently he has  been feeling tired still during the day.  He will end up taking a nap  for about an hour in the afternoon, but at this time, he is not using a  CPAP machine.  Otherwise, he is not having any difficulties as far as  nasal congestion, nasal dryness or mouth dryness.   Overall, I think Mr. Seckman is doing reasonably well with his CPAP.  I  have advised him that if he is to take a nap during the afternoon, he  should use a CPAP machine, where ideally, what he should try to do is  try to consolidate his sleep to the nighttime and avoid taking naps  during the day.  I will have him undergo an auto CPAP titration study  for  two weeks to determine if we need to make any further adjustments in  his pressure setting.  I will call him with the results of his auto CPAP  titration study.  Otherwise, I plan to follow with him in approximately  4-6 months.     Coralyn Helling, MD  Electronically Signed    VS/MedQ  DD: 03/26/2007  DT: 03/27/2007  Job #: [id number]   cc:   Rosalyn Gess. Norins, MD

## 2010-11-29 NOTE — Procedures (Signed)
NAME:  [name], [name] NO.:  [id number]   MEDICAL RECORD NO.:  [id number]          PATIENT TYPE:  OUT   LOCATION:  SLEEP CENTER                 FACILITY:  [facility]   PHYSICIAN:  Coralyn Helling, MD        DATE OF BIRTH:  April 01, 1929   DATE OF STUDY:  12/21/2006                            NOCTURNAL POLYSOMNOGRAM   REFERRING PHYSICIAN:  Coralyn Helling, MD   FACILITY:  [facility]   INDICATION FOR STUDY:  This is an individual who has a history of heart  disease and hypertension.  He had undergone an overnight polysomnogram  on October 18, 2006, which showed an overall apnea/hypopnea index of 49  with an oxygen saturation of just 85%.  He returns to the sleep lab for  a C-PAP titration study.   EPWORTH SLEEPINESS SCORE:  9.   MEDICATIONS:  Vytorin, Altace, doxazosin, aspirin and a multivitamin.   SLEEP ARCHITECTURE:  Total recording time was 412 minutes.  Total sleep  time was 232 minutes.  Sleep efficiency was 56%, which is reduced.  Sleep latency was 62 minutes, which is prolonged.  REM latency was 39  minutes, which is reduced.   The study was notable for the lack of slow-wave sleep.  The patient  slept in both the supine and nonsupine positions.   RESPIRATORY DATA:  The average respiratory rate was 18.  The patient was  titrated from a C-PAP pressure setting of 5-12 cm of water.  At a C-PAP  pressure setting of 12 cm of water, the apnea/hypopnea index was reduced  to zero.  At this pressure setting, the patient was observed in both REM  sleep and supine sleep and snoring was eliminated.   OXYGEN DATA:  The baseline oxygenation was 95%.  The oxygen saturation  nadir was 89%.  At a C-PAP pressure setting of 12 cm of water, the mean  oxygenation during non-REM sleep was 95%, the mean oxygenation during  REM sleep was 95%, the minimal oxygenation during non-REM sleep was 94%,  and the minimal oxygenation during REM sleep was 94%.   CARDIAC DATA:  The average  heart rate was 67 and the rhythm strip showed  normal sinus rhythm with PVCs.   MOVEMENT-PARASOMNIA:  The periodic limb movement index was 5.2.  The  patient had two bathroom trips.   IMPRESSIONS-RECOMMENDATIONS:  This was a C-PAP titration study.  At a C-  PAP pressure setting of 12 cm of water, the apnea/hypopnea index was  reduced to zero.  At this pressure setting, the patient was observed  during both REM sleep and supine sleep.      Coralyn Helling, MD  Diplomat, American Board of Sleep Medicine  Electronically Signed     VS/MEDQ  D:  12/21/2006 06:46:37  T:  12/21/2006 09:43:57  Job:  [id number]

## 2010-11-29 NOTE — Discharge Summary (Signed)
NAME:  [name], PAULDING NO.:  [id number]   MEDICAL RECORD NO.:  [id number]          PATIENT TYPE:  INP   LOCATION:  2012                         FACILITY:  [facility]   PHYSICIAN:  Bevelyn Buckles. Bensimhon, MDDATE OF BIRTH:  [birth year]   DATE OF ADMISSION:  06/19/2007  DATE OF DISCHARGE:  06/22/2007                               DISCHARGE SUMMARY   PRIMARY CARDIOLOGIST:  Dr. Daleen Squibb.   PRIMARY CARE PHYSICIAN:  Dr. Debby Bud.   DISCHARGE DIAGNOSIS:  Non-ST segment elevation and myocardial  infarction.   SECONDARY DIAGNOSES:  1. Coronary artery disease.  2. Hyperlipidemia.  3. Peripheral vascular disease.  4. Renal artery stenosis.  5. Cerebrovascular disease.  6. Carotid artery disease.  7. History of sleep apnea, on CPAP.  8. Benign prostatic hypertrophy.  9. History of bladder cancer, status post surgery in 2002.  10.Remote tobacco abuse.   ALLERGIES:  SULFA, which causes a rash.   PROCEDURES:  Left heart cardiac catheterization, and successful PCI and  stenting of the vein graft to the obtuse marginal with placement of a  2.75 x 18-mm Multi-Link Vision bare metal stent.   HISTORY OF PRESENT ILLNESS:  A 77 year old Caucasian male with a prior  history of CAD, status post CABG in 1995, who was in his usual state of  health on June 19, 2007, when he had a sudden onset of substernal  indigestion with radiation to the arms associated with shortness of  breath, nausea, diaphoresis, flushing.  Symptoms were persistent and  worsened at times over a period of about 4 hours prior to presenting to  the ED.  In the ED, his CK was 204 with an MB of 6.6, troponin-I was  0.25.  ECG showed no acute changes.  He was admitted for further  evaluation and management of a non-ST elevation MI.   HOSPITAL COURSE:  The patient peaked his CK at 731, MB at 97.5, and  troponin-I of 26.01.  He was maintained on aspirin and heparin therapy  and taken to the cath lab on December 4, where  catheterization revealed  3 of 4 patent grafts with 95% stenosis in the vein graft to the obtuse  marginal.  EF was 65-70%.  He then underwent successful PCI and stenting  of a vein graft to the obtuse marginal, with placement of a 2.75 x 18-mm  Multi-Link Vision bare metal stent.  The patient tolerated this  procedure well, and postprocedure was doing well.  He was transferred  out to telemetry on December 5, where he has been ambulating without  recurrent symptoms or limitations.  He is being discharged home today in  good condition.   DISCHARGE LABORATORY DATA:  Hemoglobin 12.8, hematocrit 38.0, WBCs 6.2,  platelets 148.  Sodium 140, potassium 3.9, chloride 112, CO2 of 24, BUN  18, creatinine 1.35, glucose 106.  INR 1.1.  Total bilirubin 1.1,  alkaline phosphatase 45, AST 80, ALT 26, albumin 3.1.  CK 182, MB 5.8,  troponin-I 5.08.  Total cholesterol 107, triglycerides 43, HDL 44, LDL  54.  Calcium 8.5.   DISPOSITION:  The  patient is being discharged home today in good  condition.   FOLLOWUP PLANS AND APPOINTMENTS:  We will arrange for a followup with  Dr. Daleen Squibb in approximately 2 weeks.  He is asked to follow up with Dr.  Debby Bud as previously scheduled.   DISCHARGE MEDICATIONS:  1. Aspirin 325 mg daily.  2. Plavix 75 mg daily.  3. Altace 5 mg daily.  4. Lopressor 25 mg b.i.d.  5. Vytorin 10/40 mg q.h.s.  6. Calcium 1200 mg daily.  7. Tracer study drug daily.  8. Doxazosin 8 mg daily.  9. Multivitamin 1 daily.  10.Vitamin C 1000 mg daily.  11.Omega-3 acid daily.  12.Claritin 10 mg daily.  13.Nitroglycerin 0.4 mg sublingual p.r.n. chest pain.   OUTSTANDING LAB STUDIES:  None.   DURATION OF DISCHARGE ENCOUNTER:  48 minutes including physician time.      Nicolasa Ducking, ANP      Bevelyn Buckles. Bensimhon, MD  Electronically Signed    CB/MEDQ  D:  06/22/2007  T:  06/22/2007  Job:  [id number]   cc:   Rosalyn Gess. Norins, MD

## 2010-11-29 NOTE — Assessment & Plan Note (Signed)
OFFICE VISIT   [name], [name]  DOB:  [birth year]                                        May 03, 2010  CHART #:  [id number]   HISTORY:  The patient returned to my office today for followup status  post redo sternotomy and aortic valve replacement with a 23-mm Mitroflow  aortic pericardial heart valve on April 18, 2010.  He came back a  little early, reporting some coughing that has been fairly persistent  over the past week as well as some occasional episodes of dizziness.  He  has been walking without chest pain or shortness of breath.  He said he  still is not sleeping well, but has been able to sleep in a semi-  recumbent position in bed.  He denies any fever or chills.  He has had  no sputum production or hemoptysis.   PHYSICAL EXAMINATION:  Today, his blood pressure is 131/61, pulse 62 and  regular, and respiratory rate is 16, unlabored.  Oxygen saturation on  room air is 97%.  He looks well.  Cardiac exam shows regular rate and  rhythm with normal prosthetic valve sounds.  There is no murmur, rub, or  gallop.  Lung exam reveals decreased breath sounds in the bases.  Chest  incision is healing well and the sternum is stable.  There is trace  ankle edema.   DIAGNOSTIC TESTS:  Followup chest x-ray shows small bilateral pleural  effusions with mild bibasilar atelectasis.   MEDICATIONS:  1. Aspirin 325 mg daily.  2. Doxazosin 4 mg b.i.d.  3. Vitamin C 500 mg b.i.d.  4. Calcium 600 mg daily.  5. Vytorin 10/40 daily.  6. Multivitamin daily.  7. Fish oil 1000 mg t.i.d.  8. Zyrtec 5 mg b.i.d.  9. Plavix 75 mg daily.  10.Lopressor 50 mg b.i.d.  11.Amiodarone 200 mg b.i.d.   IMPRESSION:  The patient is making a good recovery following his  surgery.  I suspect that his cough is probably related to small  bilateral pleural effusions and bibasilar atelectasis.  I would expect  this would gradually get better as the effusion resolves.  He does have  trace ankle edema and therefore I wrote him for a 5-day course of Lasix  40 mg daily and potassium chloride 20 mEq daily.  I also asked him to  use his incentive spirometer.  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  He has a followup appointment  to see me on May 17, 2010, and I will repeat his chest x-ray at that  time.   [name], M.D.  Electronically Signed   BB/MEDQ  D:  05/03/2010  T:  05/04/2010  Job:  [id number]

## 2010-11-29 NOTE — H&P (Signed)
NAME:  [name], [name] NO.:  [id number]   MEDICAL RECORD NO.:  [id number]          PATIENT TYPE:  INP   LOCATION:  2908                         FACILITY:  [facility]   PHYSICIAN:  Jesse Sans. Wall, MD, FACCDATE OF BIRTH:  Jan 01, 1929   DATE OF ADMISSION:  06/19/2007  DATE OF DISCHARGE:                              HISTORY & PHYSICAL   PRIMARY CARDIOLOGIST:  Dr. Juanito Doom   PRIMARY CARE PHYSICIAN:  Dr. Debby Bud   HISTORY OF PRESENT ILLNESS:  This 77 year old Caucasian male with known  coronary artery disease, coronary artery bypass grafting 1995,  peripheral vascular disease status post stent to the left, superficial  femoral artery in 1995 with complaints of substernal indigestion.  The  patient was decorating his Christmas tree, working on his computer this  morning and had just finished climbing stairs and when he arrived  upstairs, he had some substernal indigestion with achiness and heaviness  in his arms bilaterally.  Felt very uncomfortable.  Had numbness in his  arms.  He was short of breath.  Mildly nauseated and felt kind of  clammy.  He went back downstairs, saw his wife and he was flushing.  Sat  down and rested.  Did not take any nitroglycerin as he did not have any  at home and the pain remained.  It was crescendo/decrescendo in  intensity, lasting approximately 4 hours.  When it did not go away and  he began to feel worse, his wife insisted that he come to the emergency  room.  She drove him here.   When he arrived at the emergency room, he was found to hypertensive with  a blood pressure of 217 systolic.  The patient was brought back through  to the emergency room holding area and into a bay and was started on  nitroglycerin.  Heparin was given.  Aspirin and 1 sublingual  nitroglycerin.  Once the nitroglycerin drip was started, the pain began  to subside and approximately 1 hour later, he was absolutely pain free.  The patient also admits he has not been  resting well over the last week,  just uncomfortable with no [place] chest pain.   REVIEW OF SYMPTOMS:  Positive for substernal chest pain, shortness of  breath, cold, clammy, nauseated, flushing.   PAST MEDICAL HISTORY:  1. Known CAD with coronary artery bypass grafting in 1995; unknown      vessels.  2. The patient also had a stress test in July 2008 found to be normal.  3. The patient also has peripheral vascular disease with renal artery      stenosis with 50% osteolith renal artery stenosis, 70% right renal      artery stenosis and also 80% left superficial femoral artery      stenosis status post stent in January 2005.  4. The patient also has a history of carotid artery disease with a      history of left-sided 50% carotid artery disease, which has been      stable over the last several years with annual carotid Doppler  studies.  5. The patient has sleep apnea on CPAP.  6. The patient has restless leg syndrome.  7. Hyperlipidemia.  8. BPH.   PAST SURGICAL HISTORY:  1. Bladder cancer surgery in 2002.  2. Coronary artery bypass grafting in 1995.  3. Stent placement to the superficial femoral artery on the left in      2005.   SOCIAL:  He lives in [place] with his wife.  He is retired.  He  has not smoked since 1965.  Rare ETOH use.   FAMILY HISTORY:  Not significant secondary to age and current diagnoses.   CURRENT MEDICATIONS AT HOME:  1. Altace 5 mg daily.  2. Aspirin 81 mg daily.  3. Doxazosin 8 mg daily.  4. Vitamin C 1000 mg once a day.  5. Calcium 1200 mg once a day.  6. Multivitamin once a day.  7. Omega-3 once a day.   ALLERGIES:  SULFA CAUSING A RASH.   CURRENT LABS:  CK 204, MB 6.6, troponin 0.25, PT 13.4, INR 1.0.  Chemistries and CBC are pending.  EKG revealing evidence of non ST  elevated MI with changes inferolaterally with ST depression noted V5-V6.  Patient also has right bundle branch block, which is not new.   PHYSICAL EXAM:  GENERAL:   He is awake, alert, oriented.  No acute  distress.  HEENT:  Head is normocephalic/atraumatic.  Eyes:  PERRLA.  Mucous  membranes of mouth:  Pink and moist.  Tongue midline.  NECK:  Supple.  He does have a right-sided carotid bruit and a soft left-  sided carotid bruit.  CARDIOVASCULAR:  Regular rate and rhythm with 2/6 systolic murmur with a  splint S2 noted without rubs or gallops.  LUNGS:  Clear to auscultation.  ABDOMEN:  Soft, nontender with 2+ bowel sounds.  No abdominal bruits are  appreciated.  EXTREMITIES:  Without cyanosis, clubbing or edema.  Radial pulses 1+ and  equal bilaterally.  Left dorsalis pedis pulses is 1+ bilateral, right  dorsalis pedis pulse is slightly diminished.  SKIN:  Warm and dry.  NEURO:  Intact.   IMPRESSION:  1. Acute coronary artery syndrome with non ST elevated MI, positive      troponin and EKG for inferolateral ischemia.  2. Peripheral vascular disease.  3. Right bundle branch block, stable.  4. Systolic murmur.  5. Hypertension.  6. Hypercholesteremia.  7. Carotid artery disease.   PLAN:  The patient has been seen and examined in the emergency room by  myself and Dr. Juanito Doom.  The patient will be started on heparin and  nitroglycerin, p.o. Plavix.  He will be scheduled for cardiac  catheterization in the morning.  We will also start him on Lopressor 25  mg b.i.d. and monitor him overnight for recurrence of chest  discomfort.  If it becomes severe, we will bring him to the cardiac  catheterization lab emergency, otherwise, he will be done in the  morning.  This has been discussed with the patient, verbalizes  understanding.  Risks and benefits have been also discussed and he is  willing to proceed.      Bettey Mare. Lyman Bishop, NP      Jesse Sans. Daleen Squibb, MD, [facility]  Electronically Signed    KML/MEDQ  D:  06/19/2007  T:  06/20/2007  Job:  [id number]   cc:   Rosalyn Gess. Norins, MD

## 2010-11-29 NOTE — Assessment & Plan Note (Signed)
OFFICE VISIT   [name], [name]  DOB:  06-03-1929                                        April 14, 2010  CHART #:  [id number]   HISTORY:  The patient returned to my office today to review operative  plans for his planned surgery on Monday, next week.  He is scheduled to  have redo sternotomy and aortic valve replacement with a tissue valve.  He has been feeling fairly well since I last saw him, but states he  really is not doing very much.  He said he has been fairly tired by the  end of the day, but denies any shortness of breath or chest pain.  He  has had no orthopnea or PND.  He denies any further dizziness or  syncope.   PHYSICAL EXAMINATION:  Today, his blood pressure 132/56, pulse 52 and  regular, and respiratory rate is 16 and unlabored.  Oxygen saturation on  room air is 95%.  He looks well.  Cardiac exam shows regular rate and  rhythm with a grade 3/6 systolic murmur over the aorta.  Lungs are  clear.  There is no peripheral edema.   He stopped his Plavix and aspirin on Sunday of this week.   IMPRESSION:  I reviewed the operative plans again with the patient and  his wife.  We discussed the benefits and risks including, but not  limited to bleeding, blood transfusion, infection, stroke, myocardial  infarction, graft failure, organ dysfunction, and death.  He is ready to  proceed with surgery on Monday.  He has no further questions.   [name], M.D.  Electronically Signed   BB/MEDQ  D:  04/14/2010  T:  04/15/2010  Job:  [id number]

## 2010-11-29 NOTE — Assessment & Plan Note (Signed)
[name]                            CARDIOLOGY OFFICE NOTE   [name], [name]                       MRN:          [id number]  DATE:07/08/2007                            DOB:          1929/05/10    [name] returns today after being [facility]  [facility], 2007 after non-STEMI.   He underwent cardiac catheterization which showed 3 of his 4 bypass  grafts to be patent with a 95% stenosis in a vein graft to an obtuse  marginal.  His EF was 65% - 70%.  He had PCI and stenting of the vein  graft to the obtuse margin with a multi length bare metal stent.   He has done well since the procedure.   1. He also carries a diagnosis of mild to moderate aortic stenosis by      2-D echocardiogram July 02, 2007.  His mean gradient is 21      mmHg.  His valve area is about 1.2.  He is totally asymptomatic      with this.  2. Nonobstructive carotid disease.  Last carotid Dopplers May 14, 2007 were stable.  Follow-up in 1 year.   CURRENT MEDICATIONS:  1. Altace 5 mg a day.  2. Doxazosin 8 mg a day.  3. Vytorin 10/40 daily.  4. Omega 3 two-thousand mg a day.  5. Aspirin 325 mg a day.  6. Plavix 75 mg a day.  7. Metoprolol 25 mg p.o. b.i.d.  8. Tracer study drug.  9. Magnesium 400 mg a day.   He looks remarkably good today.  His blood pressure is 136/62, his pulse  is 55 and regular, his weight is 160.  HEENT:  Normocephalic/atraumatic, PERRLA, extraocular movements intact,  sclerae are clear, facial symmetry is normal.  Carotid upstrokes were  equal with bilateral bruits, thyroid is not enlarged, trachea is  midline.  LUNGS:  Clear.  HEART:  Reveals a nondisplaced PMI, he has a systolic murmur along the  left sternal border, S2 splits physiologically, there is no gallop.  ABDOMINAL:  Soft, good bowel sounds, no midline bruit.  EXTREMITIES:  Reveal no edema, pulses are intact.  NEURO:  Exam is intact.   ASSESSMENT/PLAN:  [name] is doing well.  I have asked him to  continue with his current medications.  Will continue Plavix for a year  along with his aspirin for his non ST-elevation myocardial infarction.  I will see him back in 4 months.  Will have to follow his aortic  stenosis  closely.  We went over symptoms at length today, worsening severity of  that also as well coronary disease.     Thomas C. Daleen Squibb, MD, [facility]  Electronically Signed    TCW/MedQ  DD: 07/08/2007  DT: 07/09/2007  Job #: [id number]   cc:   Rosalyn Gess. Norins, MD

## 2010-11-29 NOTE — Assessment & Plan Note (Signed)
[facility]                            CARDIOLOGY OFFICE NOTE   [name], [name]                       MRN:          [id number]  DATE:05/12/2008                            DOB:          Jan 13, 1929    [name] returns today for further management of his coronary disease  and mild-to-moderate aortic stenosis.   He just bought him a new toy, a heavy duty weeder with a blade on end of  it.  He is not having any exertional angina, shortness of breath,  presyncope with all his outdoor activities.  His wife worries about him.   His meds are unchanged since his last visit.  He remains on Altace 5 mg  a day, aspirin 325 a day, omega-3, Plavix 75 mg a day; tracer study  drug, metoprolol 25 b.i.d., magnesium 400 mg a day, and doxazosin 8 mg a  day.   PHYSICAL EXAMINATION:  VITAL SIGNS:  His blood pressure today is 146/62,  his pulse is 58 and regular, his weight is 162; up 4.  He looks very  young for his stated age, alert and oriented x3.  SKIN:  Warm and dry.  Has some bruises over his hands from working.  Pulses are intact.  HEENT:  Normal.  Carotid upstrokes are equal bilaterally with bilateral  bruits versus referred sounds or both.  Thyroid is not enlarged.  Trachea is midline.  LUNGS:  Clear to auscultation and percussion.  HEART:  Reveals systolic murmur along left sternal border consistent  with aortic stenosis.  S2 does split.  There was no diastolic component.  ABDOMINAL:  Soft, good bowel sounds.  No midline or flank bruit.  EXTREMITIES:  No cyanosis, clubbing, or edema.  Pulses are intact.  NEURO:  Intact.   [name] is doing well.   PLAN:  1. Carotid Dopplers next week which she is due.  2. A 2-D echocardiogram on next visit in 6 months.  3. We will forego a stress test since he is basically stressing      himself every day on outside.  He is totally asymptomatic.  I think      the likelihood of finding anything is very low.   We  will plan on seeing him back in 6 months.  I made no changes in his  medical program.     [name]. Ricky Squibb, MD, [facility]  Electronically Signed    TCW/MedQ  DD: 05/12/2008  DT: 05/13/2008  Job #: [id number]

## 2010-11-29 NOTE — Cardiovascular Report (Signed)
NAME:  [name], [name] NO.:  [id number]   MEDICAL RECORD NO.:  [id number]          PATIENT TYPE:  INP   LOCATION:  2908                         FACILITY:  [facility]   PHYSICIAN:  Bevelyn Buckles. Bensimhon, MDDATE OF BIRTH:  [birth year]   DATE OF PROCEDURE:  06/20/2007  DATE OF DISCHARGE:                            CARDIAC CATHETERIZATION   PRIMARY CARE PHYSICIAN:  Rosalyn Gess. Norins, MD   CARDIOLOGIST:  Jesse Sans. Wall, MD, [facility]   PATIENT IDENTIFICATION:  [name] is a very pleasant 77 year old male  with a history of coronary artery disease, hyperlipidemia, peripheral  vascular disease with history of CABG in 1995.  He is admitted with non-  ST elevation myocardial infarction and he was brought to the  catheterization lab for diagnostic angiography.   PROCEDURES:  1. Selective coronary angiography.  2. Saphenous vein graft angiography x3.  3. LIMA angiography.  4. Left heart catheterization.  5. Left ventriculogram.   DESCRIPTION OF PROCEDURE:  The risks and the indications of the  procedure were explained.  Consent was signed and placed on the chart.  The right coronary artery was prepped and draped in routine sterile  fashion and anesthetized with 1% local lidocaine.  A 6-French arterial  sheath was placed in the right femoral artery using modified Seldinger  technique.  Standard catheters including JL-4, JR-4 and angled pigtail  were used for the procedure.  A Noto catheter was used to get the  saphenous vein graft to the right coronary artery.  We used the right  coronary artery catheter to get the left internal mammary.  All catheter  exchanges made over wire.  There are no apparent complications.   Central aortic pressure 141/58 with a mean of 88, LV pressure was 151/6  with an EDP of 21.  Although we had no difficulty crossing the aortic  valve, there was an apparent 20 mmHg gradient peak across the valve on  pullback with a mean of 17 mmHg.  This is  consistent with mild aortic  stenosis.   Left main was normal.   LAD was a long vessel coursing to the apex.  It had an 80% lesion in the  mid section.  The distal vessel was seen filling primarily from the  native vessel with some competitive flow from the LIMA.  There was a  diagonal that was totally occluded.   Left circumflex was a large vessel that gave on the small OM1.  There  was a distal marginal that appeared occluded.  There were two posterior  laterals.  There was also a moderate sized ramus branch.  There was a 30-  40% lesion in the mid AV groove circ.   Right coronary artery was a dominant vessel.  It was totally occluded  distally after the takeoff of two RV branches.   LIMA to the LAD was widely patent.  However, the distal LAD filled  primarily through the native vessel.   Saphenous vein graft to diagonal was patent.   Saphenous vein graft to the right PDA was widely patent, and there was  distal PL  filling as well.  It was difficult to tell if this was filling  through the native distal right coronary artery after the graft or if  there was actually a small sequential graft which was reported in the OP  note.  In the PDA, there was a 40% mid lesions.  In the distal right  coronary out by the posterior lateral, there was a 90% lesion.   Saphenous vein graft to the OM had a 95% filling defect in the proximal  portion consistent with a ruptured plaque and clot.   Left ventriculogram done in the RAO position showed an ejection fraction  of 65-70% with no wall motion abnormalities.  No significant mitral  regurgitation.   ASSESSMENT:  1. Three vessel native coronary artery disease.  2. LIMA to the LAD is patent.  However, the distal LAD fills mainly      through the native vessel.  3. Saphenous vein graft to the diagonal is okay.  Saphenous vein graft      to the PDA with a questionable sequential graft to the posterior      lateral is okay.  However, it is  unclear if the distal limb is      patent or not, or whether or not this is native vessel.  4. Saphenous vein graft to the OM has a ruptured plaque with a 95%      stenosis proximally.  5. Normal LV function.  6. Question of mild aortic stenosis on pullback across the aortic      valve.   PLAN:  Percutaneous intervention from the saphenous vein graft to the OM  with Dr. Riley Kill.  Would also check a 2D echocardiogram to evaluate the  aortic valve.      Bevelyn Buckles. Bensimhon, MD  Electronically Signed     DRB/MEDQ  D:  06/20/2007  T:  06/20/2007  Job:  [id number]   cc:   Rosalyn Gess. Norins, MD  Jesse Sans. Wall, MD, [facility]

## 2010-11-29 NOTE — Consult Note (Signed)
NEW PATIENT CONSULTATION   MUKUND, WEINREB  DOB:  April 15, 1929                                        March 23, 2010  CHART #:  [id number]   REASON FOR CONSULTATION:  Severe symptomatic aortic stenosis.   CLINICAL HISTORY:  I was asked by Dr. Daleen Squibb to evaluate the patient for  consideration of redo sternotomy and aortic valve replacement.  He is a  77 year old gentleman who underwent coronary bypass surgery x5 by me on  August 29, 1993.  He had a left internal mammary graft to the LAD with  a sequential saphenous vein graft to posterior descending and  posterolateral branches of the right coronary artery as well as  saphenous vein graft to the obtuse marginal branch of the left  circumflex coronary artery and a vein graft to the diagonal branch.  He  did well from a cardiac standpoint over the years but in December of  2008 developed acute chest pain and had a stent placed in the proximal  portion of the left circumflex vein graft.  The other vein grafts were  all patent and looked good a-s did the left internal mammary graft.  The  patient had an echocardiogram at that time that showed mild aortic  stenosis with thickened aortic valve leaflets.  He has been followed  over the years for aortic stenosis.  An echocardiogram was done in April  2008 which showed severe aortic stenosis with valve area of about 0.8.  The patient was continued on medical therapy since he was not  complaining of any significant symptoms at that time.  Over the past  year, he has noted progressive exertional fatigue and some shortness of  breath.  He has had some episodes of dizziness when he stands up or is  bending over.  He had a stress Myoview exam done in April 2011 that  showed good exercise tolerance.  There was an old inferior wall infarct.  Ejection fraction was 63%.  There was no significant ischemia.  Echocardiogram at that time showed severe aortic stenosis with an  increase in his mean gradient to 42 mmHg and a peak gradient that  increased to 67 mmHg.  There was mild aortic insufficiency.  The mitral  valve had what was read as moderate regurgitation.  There was no  evidence of stenosis with a mildly calcified mitral annulus.  There was  trivial tricuspid regurgitation.  Given the patient's development of  symptoms and severe aortic stenosis.  He subsequently underwent cardiac  catheterization on March 14, 2010.  The left main was normal.  The LAD  was a long vessel that wrapped around the apex.  In the proximal to mid  LAD, there was 70% to 80% focal stenosis.  The second diagonal branch  was totally occluded.  The left circumflex had a totally occluded second  marginal branch.  The right coronary artery was a dominant vessel that  was totally occluded in the mid-to-distal section.  Examination of the  graft showed a patent left internal mammary graft to the LAD, although  with a relatively small vessel that filled the portion of the mid LAD.  The distal portion the LAD was mainly supplied through the native  vessel.  The saphenous vein graft to the second diagonal branch was  widely patent with minimal  luminal irregularity.  The saphenous vein  graft to the obtuse marginal was patent.  The stent in the proximal  portion was widely patent.  Saphenous vein graft to posterior descending  was widely patent.  This graft was occluded beyond that to the  posterolateral branch which really did not fill up.  Ejection fraction  was about 60% to 65%.  There was also about 90% to 95% right renal  artery stenosis and left renal artery had about 50% ostial stenosis.   REVIEW OF SYSTEMS:  GENERAL:  He denies any fever or chills.  There are  no recent weight changes.  He does report fatigue.  EYES:  Negative.  ENT:  He has had some hearing loss.  He has not had any problems with  his teeth.  He sees a Education officer, community regularly.  CARDIAC:  He denies any chest pain or  pressure.  He denies PND or  orthopnea.  He does have exertional dyspnea.  Denies any palpitations or  peripheral edema.  RESPIRATORY:  Denies cough and sputum production.  GI:  He has had no nausea or vomiting.  Denies melena and bright red  blood per rectum.  GU:  He denies dysuria and hematuria.  He does have history of bladder  cancer treated with cystoscopically with subsequent chemotherapy.  He is  scheduled to undergo repeat cystoscopy tomorrow.  VASCULAR:  Denies claudication and phlebitis.  He does have a history of  stent in his left leg placed in 2005 for claudication.  NEUROLOGIC:  Denies any focal weakness or numbness.  He has had some  episodes of dizziness with bending over or standing up from sitting  position.  He has never had a TIA or a stroke.  PSYCHIATRIC:  Negative.  HEMATOLOGIC:  He does bleed very easily and has been on Plavix and  aspirin.  He was reaching under his car seat today and scraped his right  arm resulting in a large hematoma in the forearm.   ALLERGIES:  Sulfa which causes hives.   PAST MEDICAL HISTORY:  Significant for coronary disease as mentioned  above status post coronary bypass surgery in 1995.  He has a history of  aortic stenosis as documented above.  He has history of peripheral  vascular disease status post stenting of his left leg in 2005 by Dr.  Samule Ohm.  He has a history of nonobstructive carotid artery disease.  He  has a history of hyperlipidemia.  He has a history of BPH.  He has  history of bladder cancer as mentioned above.  He has a history of PTCA  and stenting of obtuse marginal vein graft in 2008.  He has a history of  sleep apnea and uses CPAP for that.   MEDICATIONS:  1. Aspirin 325 mg daily.  2. Plavix 75 mg daily.  3. Altace 5 mg daily.  4. Doxazosin 4 mg b.i.d.  5. Vitamin C 500 mg b.i.d.  6. Calcium 600 mg daily.  7. Vytorin 10/40 daily.  8. Multivitamin daily.  9. Fish oil 1000 mg t.i.d.  10.Zyrtec 5 mg b.i.d.   11.Lopressor 50 mg b.i.d.  12.Magnesium 400 mg daily.  13.Sublingual nitroglycerin p.r.n.   SOCIAL HISTORY:  He is married and lives with his wife.  He is a retired  Art gallery manager.  He is a nonsmoker.  He does get regular exercise.   FAMILY HISTORY:  Father died at 91 with sudden death 1 week after  myocardial infarction.  Mother died at  78, she had hypertension and  diabetes as well as coronary disease.   PHYSICAL EXAMINATION:  Today, his blood pressure is 159/64, pulse 56 and  regular, respiratory rate is 16 and unlabored.  Oxygen saturation on  room air is 97%.  He is a well-developed white male in no distress.  HEENT exam shows him to be normocephalic and atraumatic.  Pupils are  equal and reactive to light and accommodation.  Extraocular muscles are  intact.  Oropharynx is clear.  Teeth are in fairly good condition.  Neck  exam shows normal carotid pulses bilaterally.  There is a transmitted  murmur to both sides of the neck.  There is no adenopathy or  thyromegaly.  Cardiac exam shows a regular rate and rhythm with a high  grade 3/6 systolic murmur, loudest over the aortic valve but heard  throughout the precordium.  There is no diastolic murmur.  Lungs are  clear.  There is a well-healed sternotomy incision.  The sternum feels  stable.  Abdominal exam shows active bowel sounds.  His abdomen is soft  and nontender.  There are no palpable masses or organomegaly.  Extremity  exam shows no peripheral edema.  Pedal pulses are diminished but  palpable.  There is a long vein harvest incision in the right leg from  ankle to thigh.  Neurologic exam shows him to be alert and oriented x3.  Motor and sensory exams are grossly normal.   IMPRESSION:  The patient has severe symptomatic aortic stenosis status  post coronary bypass surgery in 1995.  He has had a stent placed in the  left circumflex vein graft but this is widely patent and his vein grafts  otherwise look good.  His left internal  mammary graft to the left  anterior descending is small but it is patent, still supplying blood to  mid left anterior descending.  He does have a 70% to 80% proximal to mid  left anterior descending stenosis proximal to this, but I am not sure  that placing another graft to the distal left anterior descending would  improve his situation and the left internal mammary graft may be smaller  due to competitive flow.  He does have an occluded vein graft segment  from the posterior descending to the posterolateral branch, but the  remainder of the graft looks good and I do not see any posterolateral  branch graft there.  I agree that redo sternotomy and aortic valve  replacement should be performed to prevent progression of symptoms and  cardiomyopathy and sudden death.  He does have moderate mitral  regurgitation by 2-D echocardiogram and this was mild in 2008 and mild  to moderate in 2010.  The valve otherwise structurally looks good, and I  suspect this may be due to the worsening aortic stenosis and increased  left ventricular pressure.  I would expect this would improve with  aortic valve replacement.  I told him I would not evaluate this in the  operating room.  I discussed the pros and cons of mechanical and tissue  valves and my recommendation for a tissue valve given his age.  We  discussed the operative procedure including benefits, alternatives, and  risks including but not limited to bleeding, blood transfusion,  infection, stroke, myocardial infarction, heart block requiring  permanent pacemaker, renal dysfunction or failure possibly requiring  dialysis, and death.  He understands all this and agrees to proceed.  He  does have a 95% right renal artery stenosis.  If this was treated  preoperatively, he would need to be on Plavix for a period of time and I  do not think he should have redo heart surgery while on Plavix.  I will  discuss this further with Dr. Daleen Squibb.  He would need to  be off Plavix for  at least 7 days prior to proceeding with redo heart surgery.  The  patient said that he has a followup appointment in the next couple weeks  with Dr. Daleen Squibb, I discussed this with him, and I will talk with Dr. Daleen Squibb  myself.  The patient would like to have surgery sometime in the  beginning of October.  I will see him back prior to doing the surgery.   [name], M.D.  Electronically Signed   BB/MEDQ  D:  03/23/2010  T:  03/23/2010  Job:  [id number]   cc:   Thomas C. Daleen Squibb, MD, [facility]  Rosalyn Gess. Norins, MD

## 2010-11-29 NOTE — Assessment & Plan Note (Signed)
[facility]                             PULMONARY OFFICE NOTE   [name], [name]                       MRN:          [id number]  DATE:12/05/2006                            DOB:          11-08-1928    REFERRING PHYSICIAN:  Rosalyn Gess. Norins, MD   I met [name] with his wife today for evaluation of his sleep apnea.   [name] had undergone an overnight polysomnogram on October 18, 2006 which  showed evidence for severe obstructive sleep apnea with an overall  apnea/hypopnea index of 49 and an oxygen saturation nadir of 85%.   [name] was originally referred for the sleep test because [name] has been having  problems with snoring and witnessed apnea for years. His wife says that  [name] has always had problems with his snoring but it has gotten  particularly worse over the last several years. Again she has seen him  stop breathing. [name] will also wake up with a choking sensation. [name] also  tends to kick a lot when [name] is asleep as well as breathe through his  mouth while [name] is asleep. [name] wears a mouthguard for bruxism. [name] also  talks in his sleep, has vivid dreams and will occasionally drool while  [name] is asleep at night. His current sleep pattern is that [name] goes to bed  between 10 and 11:00 at night. [name] falls asleep fairly quickly. [name] wakes  up 2-3 times during the night to use the bathroom. [name] says usually [name] is  able to fall back to sleep although sometimes [name] will stay awake and do  reading. [name] wakes up between 5 and 7:00 in the morning but still feels  quite tired. [name] denies having headaches in the morning. [name] takes a nap  on a daily basis for 30 minutes to an hour which [name] says helps to some  degree. [name] will also end up falling asleep later in the evening watching  TV. His Epworth score today is 11/24. [name] denies any history of sleep  hallucination, sleep paralysis or cataplexy. [name] also denies any symptoms  of restless leg syndrome. [name] is not currently using anything  to help him  fall asleep at night. [name] will occasionally drink a soda to help him stay  awake if [name] is doing prolonged activities.   PAST MEDICAL HISTORY:  Significant for coronary artery disease status  post coronary artery bypass grafting in 1995 and [name] had angioplasty in  2005. [name] had seasonal allergies, hypertension, BPH, bladder cancer  treated 6 years ago with Dr. Vonita Moss. [name] has decreased hearing and  elevated cholesterol.   CURRENT MEDICATIONS:  1. Aspirin 81 mg daily.  2. Altace 5 mg daily.  3. Doxazosin 8 mg daily.  4. Vitamin C 1000 mg daily.  5. Calcium 1200 mg daily.  6. Vytorin 10/40 once daily.  7. Multivitamin once daily.  8. Omega 3 fish oil daily.  9. Loratadine 10 mg as needed.   [name] has an allergy to SULFA.   SOCIAL HISTORY:  [name] is married. 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 [name] quit smoking in 1965. [name] has only occasional  alcohol use.   FAMILY HISTORY:  Mother had heart disease and snored.   REVIEW OF SYSTEMS:  [name] denies any significant recent weight change.  Otherwise the remainder of his review of systems is unremarkable.   PHYSICAL EXAMINATION:  VITAL SIGNS:  [name] is 5 feet 6 inches tall, 153  pounds, temperature is 97.9, blood pressure is 142/72, heart rate is 86,  oxygen saturation is 96% on room air.  HEENT:  Pupils reactive. There is no sinus tenderness, no nasal  discharge. [name] has a Mallampati 3 airway with scalloped border of his  tongue and oropharyngeal crowding. There is no lymphadenopathy, no  thyromegaly.  HEART:  S1, S2 with a 2/6 systolic murmur.  CHEST:  Clear to auscultation.  ABDOMEN:  Soft, nontender, positive bowel sounds.  EXTREMITIES:  No edema, cyanosis or clubbing.  NEUROLOGIC:  No focal deficits were appreciated.   IMPRESSION:  Severe obstructive sleep apnea as demonstrated by an apnea  hypopnea index of 49 and an oxygen saturation nadir of 85%.   I have reviewed the results of his sleep study with him  and his wife. I  discussed the diagnosis of sleep apnea. I also reviewed the adverse  consequences of untreated sleep apnea including increased risk of  hypertension, coronary disease, cerebrovascular disease, diabetes and  arrhythmias. I discussed with him the importance of diet, exercise and  maintaining his weight as well as avoidance of alcohol and sedatives.  Driving precautions were reviewed with him as well. I discussed the  various treatment options for his sleep apnea including CPAP therapy,  oral appliance and surgical intervention.   Given the severity of his sleep apnea, I had recommended that [name] undergo  CPAP therapy. [name] will therefore be referred back to the sleep lab for a  CPAP titration study. I will initiate him on CPAP after this and follow  up with him in the office to assess his tolerance of CPAP therapy.     Coralyn Helling, MD  Electronically Signed    VS/MedQ  DD: 12/05/2006  DT: 12/05/2006  Job #: [id number]   cc:   Rosalyn Gess. Norins, MD  Jesse Sans. Wall, MD, [facility]

## 2010-11-29 NOTE — Assessment & Plan Note (Signed)
OFFICE VISIT   [name], SAWAYA  DOB:  04/28/1929                                        May 17, 2010  CHART #:  [id number]   HISTORY:  The patient returned to my office today for follow up status  post redo median sternotomy and aortic valve replacement using a 23-mm  pericardial heart valve.  He has been feeling fairly well overall and is  walking daily without chest pain or shortness of breath.  He has had  minimal incisional discomfort.   PHYSICAL EXAMINATION:  Blood pressure 133/65, his pulse is 57 and  regular, respiratory rate is 18 and unlabored.  Oxygen saturation on  room air is 97%.  He looks well.  Cardiac exam shows regular rate and  rhythm with normal valve sounds.  There is no murmur.  His lungs are  clear.  The chest incision is healing well, and the sternum is stable.  There is no peripheral edema.   IMAGING:  Followup chest x-ray shows complete resolution of his small  right pleural effusion and minimal residual left pleural effusion with  left basilar atelectasis.   MEDICATIONS:  1. Aspirin 325 mg daily.  2. Cardura 4 mg b.i.d.  3. Ambien 10 mg at bedtime p.r.n. for sleep.  4. Ultram p.r.n. for pain.  5. Amiodarone 200 mg b.i.d.  6. Lopressor 50 mg b.i.d.  7. Plavix 75 mg daily.  8. Zyrtec 5 mg b.i.d.  9. Fish oil 1000 mg t.i.d.  10.Multivitamin daily.  11.Vytorin 10/40 daily.  12.Calcium 600 mg daily.  13.Vitamin C 500 mg b.i.d.   IMPRESSION:  Overall, the patient is recovering well following his  surgery.  I encouraged him to continue walking as much as possible.  He  is interested in starting cardiac rehab and has already been contacted  by them.  I think he is ready to start that.  I told him he can return  to driving a car but should refrain from lifting anything heavier than  10 pounds for total of 3 months from date of surgery.  He has a followup  appointment later this week with Dr. Daleen Squibb.  Hopefully, he can get  off  his amiodarone soon.  He will contact me if he develops any problems  with his incisions.   [name], M.D.  Electronically Signed   BB/MEDQ  D:  05/17/2010  T:  05/18/2010  Job:  [id number]   cc:   Thomas C. Daleen Squibb, MD, [facility]  Rosalyn Gess. Norins, MD

## 2010-12-02 NOTE — Assessment & Plan Note (Signed)
[facility]                            CARDIOLOGY OFFICE NOTE   [name], [name]                       MRN:          [id number]  DATE:10/26/2006                            DOB:          [birth year]    Mr. Kaluzny returns today for further management of the following issues.  1. Coronary artery disease status post coronary artery bypass grafting      in 1995.  He is having dyspnea on exertion particularly with doing      manual work.  He is due a stress Myoview.  2. Hyperlipidemia, followed by Dr. Debby Bud.  3. Peripheral vascular disease.  He has continued to have minimal      claudication in the right calf, but it is not rate-limiting.  His      left is doing well status post intervention by Dr. Samule Ohm.  4. Cerebrovascular disease.  Carotid Dopplers have shown stable      internal carotid artery disease with antegrade flow in both      vertebrals.  He is asymptomatic.   There are no other complaints.   MEDICATIONS:  1. Aspirin 81 mg a day.  2. Altace 5 mg a day.  3. Doxazosin 8 mg a day.  4. Vitamin C 1000 mg a day.  5. Calcium 1200 mg a day.  6. Vytorin 10/40 daily.  7. Multivite daily.  8. Omega 3 daily.   PHYSICAL EXAM:  His blood pressure today is 131/69, pulse 78 and  regular, weight is 153, down 3.  HEENT:  Normocephalic, atraumatic.  PERRLA.  Extraocular muscles are  intact.  Sclerae clear.  Facial symmetry is normal.  NECK:  Supple.  Carotid upstrokes are equal bilaterally without bruits.  There is no JVD.  Thyroid is not enlarged.  LUNGS:  Clear.  CARDIAC:  Sternotomy site is stable.  PMI is not displaced.  He has a  systolic murmur that sounds like some mild aortic sclerosis.  His S2  splits.  ABDOMEN:  Soft.  No midline bruit.  No hepatomegaly.  Good bowel sounds.  EXTREMITIES:  Reveal good pulses bilaterally.  There is no edema.  No  tenderness in the calves.  NEURO:  Intact.   ELECTROCARDIOGRAM:  Sinus rhythm with PVCs.   Right bundle, which is  stable.   ASSESSMENT AND PLAN:  Mr. Trejos seems to be doing well.  He is due  surveillance Myoview as well as carotid Dopplers.  Will arrange.  Assuming these are stable, I will see him back in a year.     Thomas C. Daleen Squibb, MD, [facility]  Electronically Signed    TCW/MedQ  DD: 10/26/2006  DT: 10/26/2006  Job #: [id number]

## 2010-12-02 NOTE — Procedures (Signed)
NAME:  [name], [name] NO.:  [id number]   MEDICAL RECORD NO.:  [id number]          PATIENT TYPE:  OUT   LOCATION:  SLEEP CENTER                 FACILITY:  [facility]   PHYSICIAN:  Barbaraann Share, MD,FCCPDATE OF BIRTH:  May 12, 1931   DATE OF STUDY:  10/18/2006                            NOCTURNAL POLYSOMNOGRAM   REFERRING PHYSICIAN:  Rosalyn Gess. Norins, MD   INDICATION FOR STUDY:  Hypersomnia with sleep apnea.   EPWORTH SLEEPINESS SCORE:  Is 9.   MEDICATIONS:   SLEEP ARCHITECTURE:  The patient had a total sleep time of 263 minutes  with significantly decreased REM and never achieved slow wave sleep.  Sleep onset latency was prolonged at 40 minutes and REM onset latency  was fairly rapid at 47 minutes.  Sleep efficiency was very decreased at  61%.   RESPIRATORY DATA:  The patient was found to have 28 hypopneas and 184  apneas and three central apneas, for an apnea/hypopnea index of 49  events per hour.  The events occurred in all body positions and there  was very loud snoring noted throughout.   OXYGEN DATA:  There was O2 desaturation as low as 85% with the patient's  obstructive events.   CARDIAC DATA:  The patient was found to have occasional PACs, as well as  an occasional fusion beat.  There was nothing clinically significant  noted.   MOVEMENT-PARASOMNIA:  The patient was found to have 49 leg jerks with  2.7 per hour, resulting in arousal or awakening.   IMPRESSIONS-RECOMMENDATIONS:  1. Severe obstructive sleep apnea/hypopnea syndrome with an      apnea/hypopnea index of 49 events per hour and an O2 desaturation      as low as 85%.  Treatment for this degree of sleep apnea is best      achieved with weight loss if applicable, as well as CPAP.  2. Small numbers of leg jerks with mild sleep disruption.  I suspect      the leg movements are secondary to the      patient's sleep disorder breathing; however, clinical correlation      is suggested if the  patient fails to respond appropriately to      optimized treatment of his sleep disorder breathing.      Barbaraann Share, MD,FCCP  Diplomate, American Board of Sleep  Medicine  Electronically Signed     KMC/MEDQ  D:  11/02/2006 18:22:04  T:  11/03/2006 16:15:13  Job:  [id number]

## 2010-12-02 NOTE — Procedures (Signed)
[facility]  Patient:    [name], [name] Visit Number: [id number] MRN: [id number]          Service Type: Attending:  Verlin Grills, M.D. Dictated by:   Verlin Grills, M.D. Proc. Date: 07/23/01   CC:         [name], M.D. [facility]   Procedure Report  REFERRING PHYSICIAN:  Rosalyn Gess. Delacruz, M.D.  PROCEDURE:  Colonoscopy and polypectomy.  PROCEDURE INDICATION:  [name] is a 77 year old male born 06/20/1929.  He is referred for his first screening colonoscopy with polypectomy to prevent colon cancer.  On December 30, 1996, he underwent a screening flexible proctosigmoidoscopy, which revealed colonic diverticulosis but no endoscopic evidence for the presence of colorectal neoplasia.  CHRONIC MEDICATIONS:  Aspirin, Zocor, multivitamin, vitamin E, vitamin C, calcium, Altace, and Cardura.  PAST MEDICAL HISTORY:  Coronary artery disease, coronary artery bypass grafting in 1995, hyperlipidemia, transitional cell carcinoma of the bladder.  ENDOSCOPIST:  Verlin Grills, M.D.  PREMEDICATION:  Versed 5 mg, Demerol 50 mg.  ENDOSCOPE:  Olympus pediatric colonoscope.  DESCRIPTION OF PROCEDURE:  After obtaining informed consent, [name] was placed in the left lateral decubitus position.  I administered intravenous Demerol and intravenous Versed to achieve conscious sedation for the procedure.  The patients blood pressure, oxygen saturation, and cardiac rhythm were monitored throughout the procedure and documented in the medical record.  Anal inspection was normal.  Digital rectal exam revealed a non-nodular prostate.  The Olympus pediatric video colonoscope was introduced into the rectum and easily advanced to the cecum.  Colonic preparation for the exam today was excellent.  [name] consumed the Visicol tablets for his colonic prep.  Rectum normal.  Sigmoid colon and descending colon:  Extensive left  colonic diverticulosis. At 40 cm from the anal verge, a 1 mm sessile polyp was removed with the cold snare and submitted for pathologic interpretation.  Splenic flexure normal.  Transverse colon normal.  Hepatic flexure normal.  Ascending colon normal.  Cecum and ileocecal valve normal.  ASSESSMENT: 1. Extensive left colonic diverticulosis. 2. At 40 cm from the anal verge in the midsigmoid colon, a 1 mm sessile polyp    was removed with the cold snare.  RECOMMENDATIONS:  Repeat colonoscopy in approximately five years. Dictated by:   Verlin Grills, M.D. Attending:  Verlin Grills, M.D. DD:  07/23/01 TD:  07/23/01 Job: [id number]

## 2010-12-02 NOTE — Op Note (Signed)
NAME:  [name], [name] NO.:  [id number]   MEDICAL RECORD NO.:  [id number]                   PATIENT TYPE:  OIB   LOCATION:  5707                                 FACILITY:  [facility]   PHYSICIAN:  Salvadore Farber, M.D.             DATE OF BIRTH:  January 11, 1929   DATE OF PROCEDURE:  08/05/2003  DATE OF DISCHARGE:                                 OPERATIVE REPORT   PROCEDURE:  Abdominal aortography with bilateral lower extremity run off,  selective bilateral renal angiography, popliteal catheterization via a  contralateral approach, balloon angioplasty of the left popliteal artery  with stenting of the left superficial femoral artery.   INDICATIONS FOR PROCEDURE:  [name] is a 77 year old gentleman with  coronary artery disease status post CABG in 1995 with ten years of bilateral  calf discomfort with exertion.  This initially began at the right and  subsequently involved left with both equal amount.  This discomfort has  substantially limited his lifestyle to his and his wife's frustration.  He  has had no rest pain or ulceration.  A resting ABI obtained November 2004  was 0.92 on the right and 0.81 on the left.  There was a marked increased  velocity in both distal SFAs suggestive of severe stenoses.  Subsequent CT  angiography, to my surprise, demonstrated only moderate stenoses within the  SFAs.  With his classic claudication symptoms, abnormal ABIs, he is brought  to the catheterization lab for definitive diagnostic study.  CT angiogram  had also suggested renal artery stenosis.  The plan was for selective renal  angiography to clarify the diagnosis.   PROCEDURE TECHNIQUE:  Informed consent was obtained.  Under 1% lidocaine  local anesthesia, a 5 French sheath was placed in the right femoral artery  using the modified Seldinger technique.  The 5 French pigtail catheter was  advanced to the suprarenal abdominal aorta and abdominal aortography  performed  by power injection.  The pigtail catheter was then pulled back to  the distal abdominal aorta and abdominal aortography with bilateral lower  extremity run off to the feet was performed by power injection.  An attempt  was made to assess the left SFA lesions by pressure gradient.  To that end,  5000 units of heparin was administered.  Selective bilateral renal  angiography was then performed using 5 French LIMA catheter.  Angiography  was performed by hand injection and pressure pull back was performed.  The  LIMA catheter was then manipulated into the left common iliac artery.  A  Wooly wire was advanced via this catheter into the left SFA.  A 5 French  catheter was advanced over this wire into the common femoral artery.  The  wire was then exchanged for an angled glide wire which was manipulated into  the distal popliteal artery.  The catheter was advanced over the wire into  the mid popliteal artery  distal to the most distal lesion.  Resting gradient  was trivial.  Intra-arterial nitroglycerin was then administered via the  Intal catheter.  This demonstrated a gradient between the right SFA sheath  and the left popliteal artery of peak of 74 and mean of 27.  The decision  was thus made to treat the stenoses of the canal and proximal popliteal.   Additional heparin was given to achieve an ACT of greater than 250 seconds.  The Wooly wire was advanced via the Intal catheter to the distal popliteal  artery and the popliteal catheter removed from the popliteal.  I began with  balloon angioplasty using a 5 by 8 mm Powerflex at 6 atmospheres in the  popliteal for 30 seconds.  It was then pulled back to the SFA stenosis and  inflated to 6 atmospheres for 30 seconds.  These images demonstrated the  balloon to be grossly under sized.  Therefore, a 7 by 8 Powerflex was  advanced to the popliteal.  It was inflated to 5 atmospheres for 60 seconds.  It was then pulled back to the SFA and inflated,  again, to 5 atmospheres for  60 seconds.  The intent was for balloon angioplasty, alone.  There was a  dissection at the level of the adductor canal.  The decision was made to  stent the SFA region.  In contrast, the popliteal lesion had an excellent  angiographic result.  Thus, no further intervention was performed there.  An  8 by 60 mm Smart stent was deployed across the SFA lesion at the level of  the adductor canal.  The stent was then elevated using 7 by 80 mm Powerflex  at 5 atmospheres.  A final angiogram demonstrated no residual stenosis and  normal flow to the distal vasculature.  The Intal catheter was readvanced  over the wire into the mid popliteal.  There was no resting gradient.  After  the administration of 200 mg of intra-arterial nitroglycerin, there was a  peak gradient of 15 mmHg and 9 mmHg mean gradient.  The Terumo sheath was  then withdrawn over a wire and a dilator and replaced with an 11 cm 6 French  sheath.  The sheath was sewn in place and the patient transported to the  cath lab holding room where the sheath will be removed when the ACT is less  than 175 seconds.  The patient tolerated the procedure well.   COMPLICATIONS:  None.   FINDINGS:  1. Abdominal aorta:  Diffusely but mildly diseased.  2. Renal arteries:  Single arteries bilaterally.  The left has a 50% ostial     stenosis with a peak gradient of 39 mmHg and a mean gradient of 18 mmHg     using a 5 French catheter.  The renal artery has a 70% ostial stenosis     with a peak gradient of 85 and a mean gradient of 45 using a 5 [place]     catheter.  3. Left leg:  Widely patent common, internal, and external iliac arteries.     The common femoral is without significant disease as is the profunda.     There is a 30% stenosis of the ostium of the SFA.  The SFA then remained     without significant disease until the level of the adductor canal.    There, there is a focal 80% stenosis followed by region of  diffuse but     mild disease.  There is then  an approximately 70% stenosis in the     proximal popliteal artery.  As detailed above, these two stenoses were     treated with stenting of the SFA and balloon angioplasty of the popliteal     with excellent angiographic result.  The mid and distal popliteal are     without significant disease and there is three vessel run off to the     foot.  4. Right leg:  Normal common, internal, and external iliac arteries.  Normal     common femoral and profunda.  The SFA is normal proximally.  There are     serial 30, 30 and 50% stenoses of the adductor canal and proximal     popliteal.  The remainder of the popliteal is normal and there is three     vessel run off to the foot.   IMPRESSION/RECOMMENDATIONS:  1. Diffuse but mild disease of the abdominal aorta.  2. Bilateral renal artery stenosis with approximately 50% on the left and     70% on the right with marked gradient on the right and less marked     gradient on the left.  3. Bilateral SFA and popliteal disease.  There was successful stenting of     the left SFA with balloon angioplasty of the popliteal with excellent     angiographic result and marked diminution in the gradient.                                               Salvadore Farber, M.D.    WED/MEDQ  D:  08/05/2003  T:  08/05/2003  Job:  [id number]   cc:   Thomas C. Wall, M.D.   Rosalyn Gess Norins, M.D. [facility]

## 2010-12-02 NOTE — Assessment & Plan Note (Signed)
[facility]                           PRIMARY CARE OFFICE NOTE   CABOT, CROMARTIE                       MRN:          [id number]  DATE:09/11/2006                            DOB:          1929/05/01    [name] is a very pleasant 77 year old Caucasian gentleman followed  for multiple medical problems, who presents for a followup evaluation  and exam.  His last [place] visit was October 24, 2005 to see his  cardiologist, Dr. Juanito Doom, who felt he was stable at that time.  He was  to have followup in February 2008 and have a Myoview study.  The  patient's last primary care visit was September 04, 2005.   INTERVAL HISTORY:  1. The patient reports that he has been having some sinus drainage      since November 2007 and reports that this is starting to affect his      taste with decreased appreciation for food and flavors.  2. Fatigue and malaise. The patient reports that he is having some      fatigue and malaise, of course he is having loud snoring and his      wife reports that he does seem to have some breath holding.  The      patient has never had a sleep study or been studied for sleep      apnea.   PAST MEDICAL HISTORY:  SURGICAL:  1. Coronary artery bypass surgery 1995.  2. Cystoscopy with bladder biopsy April 2002.  3. Percutaneous transluminal angioplasty of his lower extremity 2004.   MEDICAL ILLNESSES:  1. Usual childhood disease.  2. History of hyperlipidemia.  3. Hypertension.  4. History of bladder cancer.  5. Scarlet Fever as a child.  6. Coronary artery disease.  7. Peripheral vascular disease.   CURRENT MEDICATIONS:  1. Aspirin 81 mg daily.  2. Altace 5 mg daily.  3. Doxazosin 8 mg daily.  4. Vitamin C daily.  5. Calcium daily.  6. Vytorin 10/40 daily.  7. Multivitamin daily.  8. Omega 3 daily.   PHYSICIAN ROSTER:  Cardiology Dr. Juanito Doom.  Urology Dr. Larey Dresser.   FAMILY HISTORY:  Is positive for coronary  artery disease, otherwise  noncontributory.   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  He  keeps himself very active.  He enjoys outdoor work.  He quit smoking in  1965.  He has occasional alcohol use.  His marriage is in good health.  His wife is in good health.   REVIEW OF SYSTEMS:  The patient has had no fevers or chills. He has had  a 6 to 10 pound weight loss that was intentional over the last 2 years.  The patient has seen an eye doctor approximately 2 years ago and is due  for followup. The patient reports he had a tooth pulled recently.  CARDIOVASCULAR:  Is stable with no chest pain or chest discomfort.  Last  stress Cardiolite was February 2007 and normal.  Last carotid Doppler  study was November 2007 and was stable.  The patient does have some  sinus drainage but no cough, no dyspnea on exertion or other pulmonary  problems.  GI:  The patient's last colonoscopy was 2003 and he would be  due for followup.  His gastroenterologist is Dr. Danise Edge at  [facility].  He has had no GU, musculoskeletal problems except for mild  arthritic changes.  No dermatologic or neurologic problems.   CHART REVIEW:  Colonoscopy with Dr. Daphine Delacruz in 2003 as noted.  Last  Cardiolite as noted from September 08, 2005.  Last carotid Doppler as  noted from May 24, 2006 with 60% to 79% RICA, 13% to 59% LICA and is  stable with recommended followup in 1 year.  Last note from Dr. Vonita Moss  from January 2007 where the patient was to have return cystoscopy in  June 2007.  No report available.  Last chest x-ray from July 28, 2003  which was unremarkable with no active lung disease.   PHYSICAL EXAMINATION:  Temperature is 97.2, blood pressure 132.70, pulse  74, weight 151.  GENERAL APPEARANCE:  A well-nourished, well-developed gentleman looking  younger than his 77 years who is in no acute distress.  HEENT:  Normocephalic, atraumatic.  EACs and TMs were unremarkable.  The   patient had no sinus tenderness to percussion. Oropharynx with native  dentition in good repair.  Posterior pharynx was clear.  Conjunctivae,  sclerae was clear.  PERRLA, EOMI, funduscopic exam deferred to  ophthalmology.  NECK:  Was supple without thyromegaly.  No adenopathy was noted in the  cervical, supraclavicular regions.  CHEST:  No CVA tenderness.  The patient has a well-healed sternotomy  scar.  LUNGS:  Clear with no rales, wheezes or rhonchi.  CARDIOVASCULAR:  2+ radial pulse, no JVD or carotid bruits. He had a  quiet precordium with regular rate and rhythm without murmurs, rubs or  gallops.  ABDOMEN:  Is soft, no guarding or rebound, no organosplenomegaly was  appreciated.  GENITALIA:  The patient has testicular atrophy bilaterally. Normal  vellus.  RECTAL:  Normal sphincter tone, normal size prostate with a fullness and  firmness at the right lobe.  EXTREMITIES:  Without clubbing, cyanosis, edema, deformity.  NEUROLOGIC:  Was nonfocal.   LABORATORY:  Cholesterol was 131, triglycerides 85, HDL was 46.1, LDL  was 68.  Chemistries with a potassium of 4.4, liver functions were  normal.  PSA was 2.18.   ASSESSMENT AND PLAN:  1. Cardiovascular.  The patient is current with his visits with Dr.      Juanito Doom, seems to be stable at this time.  2. Lipids.  The patient with excellent response to Vytorin and is at      goal in regards to LDL cholesterol.  Plan.  Continue Vytorin 10/20.  3. Genitourinary.  The patient with a history of bladder cancer and      also a BPH.  He is doing well with Doxazosin. He will return to see      Dr. Larey Dresser as instructed.  4. Health maintenance.  The patient is currently up to date as noted.      He does need to contact Dr. Henriette Combs office to be scheduled for      follow up colonoscopy.   SUMMARY:  This is a very pleasant gentleman who seems medically stable at this time.  He is asked to return to see me on a p.r.n. basis.      Rosalyn Gess Norins, MD  Electronically Signed  MEN/MedQ  DD: 09/12/2006  DT: 09/12/2006  Job #: [id number]   cc:   Maretta Bees. Vonita Moss, M.D.  Danise Edge, M.D.  Gwenyth Ober Allene Dillon

## 2010-12-02 NOTE — Op Note (Signed)
[facility]  Patient:    [name], [name] Visit Number: [id number] MRN: [id number]          Service Type: DSU Location: DAY Attending Physician:  Lauree Chandler Proc. Date: 03/06/01 Adm. Date:  03/06/2001   CC:         Rosalyn Gess. Norins, M.D. [facility]   Operative Report  PREOPERATIVE DIAGNOSIS:  Rule out recurrent bladder carcinoma.  POSTOPERATIVE DIAGNOSIS:  Rule out recurrent bladder carcinoma.  PROCEDURE:  Cystoscopy and cold cut bladder biopsy.  SURGEON:  Maretta Bees. Vonita Moss, M.D.  ANESTHESIA:  General.  INDICATIONS FOR PROCEDURE:  This is a 77 year old white male who was found to have a high grade papillary transitional cell carcinoma on the anterior bladder wall and also the right lateral wall of the bladder. He was treated with six weeks of BCG in May of this year and recent cystoscopy revealed some erythematous areas remaining in the bladder. He was brought to the OR today for biopsies.  DESCRIPTION OF PROCEDURE:  The patient was brought to the operating room and placed in lithotomy position. The external genitalia were prepped and draped in the usual fashion. He was cystoscoped and he had a small erythematous area lateral to the left ureteral orifice on the floor and also two erythematous hemorrhagic areas on the anterior wall of the bladder. These three areas were biopsied and the anterior wall lesions were particularly suspicious. There were no papillary lesions seen. Cold cut bladder biopsies were obtained and sent from the left anterior bladder wall, the right anterior bladder wall and the left floor lateral to the left ureteral orifice. All four biopsy sites were fulgurated with the Bugbee electrode. There was negligible blood loss and good hemostasis and the bladder was emptied, the scope removed and the patient sent to the recovery room in good condition having tolerated the procedure well. Attending Physician:  Lauree Chandler DD:  03/06/01 TD:  03/06/01 Job: [id number]

## 2010-12-02 NOTE — Discharge Summary (Signed)
NAME:  [name], [name] NO.:  [id number]   MEDICAL RECORD NO.:  [id number]                   PATIENT TYPE:  OIB   LOCATION:  5707                                 FACILITY:  [facility]   PHYSICIAN:  Salvadore Farber, M.D.             DATE OF BIRTH:  Oct 07, 1928   DATE OF ADMISSION:  08/05/2003  DATE OF DISCHARGE:  08/06/2003                           DISCHARGE SUMMARY - REFERRING   PROCEDURE:  Peripheral angiogram/stent on January 19th.   REASON FOR ADMISSION:  Please refer to the dictated admission note.   LABORATORY DATA:  Platelets 148, otherwise normal CBC at discharge.  Normal  electrolytes and renal function at discharge.   HOSPITAL COURSE:  Patient presented for elective peripheral angiography,  performed by Dr. Randa Evens (see report for full details), for  evaluation of bilateral lower extremity claudication and mildly diminished  AVIs.  Of note, there was also a suggestion of renal artery stenosis by  recent CT scan.   Angiogram revealed 50% ostial left renal artery and 70% ostial right renal  artery stenosis.   Distal aortogram notable for 80% left SFA disease.  This was successfully  treated with balloon dilatation and stenting with no noted complications.  Three-vessel run-off was noted bilaterally.  Patient was kept for overnight  observation and cleared for discharge the following morning by Dr. Samule Ohm.   Physical examination was negative for development of hematoma or bruit.  Peripheral pulses intact bilaterally.   DISCHARGE MEDICATIONS:  Patient will continue on Plavix 75 mg q.d. for one  month.  He is to otherwise resume all other previous home medications.   INSTRUCTIONS:  No heavy lifting or driving x2 days.  Call the office if  there is any swelling or bleeding in the groin.   Patient is scheduled to follow up with Dr. Randa Evens on Monday,  February 7th at 12 noon.   DISCHARGE DIAGNOSES:  1. Peripheral vascular  disease.     A. Status post stent, left superficial femoral artery, January 19th.     B. Mild-to-moderate renal artery stenosis.      Gene Serpe, P.A. [facility]                      Salvadore Farber, M.D.    GS/MEDQ  D:  08/06/2003  T:  08/06/2003  Job:  [id number]

## 2010-12-02 NOTE — Op Note (Signed)
[facility]  Patient:    [name], [name]                       MRN: [id number] Proc. Date: 10/31/00 Adm. Date:  [id number] Attending:  Lauree Chandler CC:         Corwin Levins, M.D. [facility]   Operative Report  PREOPERATIVE DIAGNOSIS:  Rule out bladder carcinoma.  POSTOPERATIVE DIAGNOSIS:  Rule out bladder carcinoma.  PROCEDURE:  Cystoscopy and transurethral resection of bladder tumors and fulguration.  SURGEON:  Dr. Larey Dresser.  ANESTHESIA:  General.  INDICATIONS FOR PROCEDURE:  This 77 year old white male was found to have microhematuria and an ultrasound of the kidneys is unremarkable. I cystoscoped them and there was an erythematous pebbly raised are worrisome for bladder carcinoma toward the dome of the bladder on the flexible cystoscopy, he is brought to the OR today for resection and biopsy of these lesions and appropriate further therapy.  DESCRIPTION OF PROCEDURE:  The patient was brought to the operating room and placed in lithotomy position. The external genitalia were prepped and draped in usual fashion. He was cystoscoped and the bladder had a raised pebbly erythematous area on the right lateral wall as opposed to the dome. The dome was clean and on the anterior wall by the air bubble, there were some erythematous areas that could be even worrisome for carcinoma in situ. Two random biopsies were taken from the erythematous area on the anterior wall near the air bubble and the biopsy sites and surrounding mucosa was fulgurated with the Bugbee electrode. The area on the right wall was then resected with several bites from the cold cut bladder biopsy forceps. They were sent together but as a separate specimen from the anterior wall biopsies. The biopsy sites in the surrounding mucosal lesion on the right wall were then fulgurated. This area was approximately 1.5 to 2 cm in size and the area on the anterior wall was about a  centimeter in size and both areas were thoroughly fulgurated and coagulated. At this point, there was no significant blood loss. There was good hemostasis. The scope was removed and the patient sent to the recovery room in good condition. DD:  10/31/00 TD:  11/01/00 Job: [id number]

## 2011-01-06 ENCOUNTER — Encounter: Payer: Self-pay | Admitting: Internal Medicine

## 2011-01-09 ENCOUNTER — Encounter: Payer: Self-pay | Admitting: Internal Medicine

## 2011-01-09 ENCOUNTER — Other Ambulatory Visit (INDEPENDENT_AMBULATORY_CARE_PROVIDER_SITE_OTHER): Payer: Medicare Other

## 2011-01-09 ENCOUNTER — Ambulatory Visit (INDEPENDENT_AMBULATORY_CARE_PROVIDER_SITE_OTHER): Payer: Medicare Other | Admitting: Internal Medicine

## 2011-01-09 DIAGNOSIS — I701 Atherosclerosis of renal artery: Secondary | ICD-10-CM

## 2011-01-09 DIAGNOSIS — Z951 Presence of aortocoronary bypass graft: Secondary | ICD-10-CM

## 2011-01-09 DIAGNOSIS — E785 Hyperlipidemia, unspecified: Secondary | ICD-10-CM

## 2011-01-09 DIAGNOSIS — D649 Anemia, unspecified: Secondary | ICD-10-CM

## 2011-01-09 DIAGNOSIS — I359 Nonrheumatic aortic valve disorder, unspecified: Secondary | ICD-10-CM

## 2011-01-09 DIAGNOSIS — Z0000 Encounter for general adult medical examination without abnormal findings: Secondary | ICD-10-CM

## 2011-01-09 DIAGNOSIS — E039 Hypothyroidism, unspecified: Secondary | ICD-10-CM

## 2011-01-09 LAB — HEPATIC FUNCTION PANEL
ALT: 27 U/L (ref 0–53)
AST: 32 U/L (ref 0–37)
Alkaline Phosphatase: 55 U/L (ref 39–117)
Bilirubin, Direct: 0.1 mg/dL (ref 0.0–0.3)
Total Bilirubin: 0.8 mg/dL (ref 0.3–1.2)

## 2011-01-09 LAB — COMPREHENSIVE METABOLIC PANEL
AST: 32 U/L (ref 0–37)
Albumin: 4.2 g/dL (ref 3.5–5.2)
Alkaline Phosphatase: 55 U/L (ref 39–117)
BUN: 25 mg/dL — ABNORMAL HIGH (ref 6–23)
CO2: 24 mEq/L (ref 19–32)
Chloride: 110 mEq/L (ref 96–112)
Creatinine, Ser: 1.6 mg/dL — ABNORMAL HIGH (ref 0.4–1.5)
Sodium: 144 mEq/L (ref 135–145)
Total Bilirubin: 0.8 mg/dL (ref 0.3–1.2)

## 2011-01-09 LAB — CBC WITH DIFFERENTIAL/PLATELET
Basophils Relative: 0.3 % (ref 0.0–3.0)
Eosinophils Absolute: 0.1 10*3/uL (ref 0.0–0.7)
MCHC: 34 g/dL (ref 30.0–36.0)
MCV: 86.2 fl (ref 78.0–100.0)
Monocytes Absolute: 0.6 10*3/uL (ref 0.1–1.0)
Neutro Abs: 2.9 10*3/uL (ref 1.4–7.7)
Neutrophils Relative %: 57.6 % (ref 43.0–77.0)
RBC: 4.56 Mil/uL (ref 4.22–5.81)
RDW: 16.8 % — ABNORMAL HIGH (ref 11.5–14.6)

## 2011-01-09 LAB — TSH: TSH: 50 u[IU]/mL — ABNORMAL HIGH (ref 0.35–5.50)

## 2011-01-09 LAB — LIPID PANEL
Total CHOL/HDL Ratio: 3
Triglycerides: 69 mg/dL (ref 0.0–149.0)

## 2011-01-09 NOTE — Progress Notes (Signed)
Subjective:    Patient ID: [name], male    DOB: [birth year], 77 y.o.   MRN: [id number]  HPI The patient is here for annual Medicare wellness examination and management of other chronic and acute problems. He has made a good recovery from aortic valve replacement and he is back to full energy. 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    The risk factors are reflected in the social history.  The roster of all physicians providing medical care to patient - is listed in the Snapshot section of the chart.  Activities of daily living:  The patient is 100% inedpendent in all ADLs: dressing, toileting, feeding as well as independent mobility  Home safety : The patient has smoke detectors in the home. They wear seatbelts. Firearms are present in the home, kept in a safe fashion. There is no violence in the home.   There is no risks for hepatitis, STDs or HIV. There is no   history of blood transfusion. They have no travel history to infectious disease endemic areas of the world.  The patient has  seen their dentist in the last six month. They have seen their eye doctor in the last year. They deny any hearing difficulty and have not had audiologic testing in the last year.  They do not  have excessive sun exposure. Discussed the need for sun protection: hats, long sleeves and use of sunscreen if there is significant sun exposure.   Diet: the importance of a healthy diet is discussed. They do have a healthy diet.  The patient has a regular exercise program: [facility] [employer] [facility] work, aerobic work , 90 min duration, 3 per week.  The benefits of regular aerobic exercise were discussed.  Depression screen: there are no signs or vegative symptoms of depression- irritability, change in appetite, anhedonia, sadness/tearfullness.  Cognitive assessment: the patient manages all their financial and personal affairs and is actively engaged. They could relate day,date,year  and events; recalled 3/3 objects at 3 minutes; performed clock-face test normally.  The following portions of the patient's history were reviewed and updated as appropriate: allergies, current medications, past family history, past medical history,  past surgical history, past social history  and problem list.  Vision, hearing, body mass index were assessed and reviewed.   During the course of the visit the patient was educated and counseled about appropriate screening and preventive services including : fall prevention , diabetes screening, nutrition counseling, colorectal cancer screening, and recommended immunizations..    Review of Systems Review of Systems  Constitutional:  Negative for fever, chills, activity change and unexpected weight change.  HEENT:  Positive for hearing loss. Negative ear pain, congestion, neck stiffness and postnasal drip. Negative for sore throat or swallowing problems. Negative for dental complaints.   Eyes: Negative for vision loss or change in visual acuity.  Respiratory: Negative for chest tightness and wheezing.   Cardiovascular: Negative for chest pain and palpitation. No decreased exercise tolerance Gastrointestinal: No change in bowel habit. No bloating or gas. No reflux or indigestion Genitourinary: Negative for urgency, frequency, flank pain and difficulty urinating.  Musculoskeletal: Negative for myalgias, back pain, arthralgias and gait problem.  Neurological: Negative for dizziness, tremors, weakness and headaches.  Hematological: Negative for adenopathy.  Psychiatric/Behavioral: Negative for behavioral problems and dysphoric mood.       Objective:   Physical Exam Vital signs reviewed Gen'l: Well nourished well developed white male in no  acute distress  HENT:  Head: Normocephalic and atraumatic.  Right Ear: External ear normal. EAC/TM nl Left Ear: External ear normal.  EAC/TM nl Nose: Nose normal.  Mouth/Throat: Oropharynx is clear and  moist. Dentition - native, in good repair. No buccal or palatal lesions. Posterior pharynx clear. Eyes: Conjunctivae and sclera clear. EOM intact. Pupils are equal, round, and reactive to light. Right eye exhibits no discharge. Left eye exhibits no discharge. Neck: Normal range of motion. Neck supple. No JVD present. No tracheal deviation present. No thyromegaly present. II/VI carotid bruits right Cardiovascular: Normal rate, regular rhythm, no gallop, no friction rub, II/VI  murmur heard, best at right sternal border with no valve click.      Quiet precordium. 2+ radial and 1+ DP pulses .  Pulmonary/Chest: Effort normal. No respiratory distress or increased WOB, no wheezes, no rales. No chest wall deformity or CVAT. Well healed sternotomy scars. Abdominal: Soft. Bowel sounds are normal in all quadrants. He exhibits no distension, no tenderness, no rebound or guarding, No heptosplenomegaly  Genitourinary:   Musculoskeletal: Normal range of motion. He exhibits no edema and no tenderness.       Small and large joints without redness, synovial thickening or deformity. Full range of motion preserved about all small, median and large joints.  Lymphadenopathy:    He has no cervical or supraclavicular adenopathy.  Neurological: He is alert and oriented to person, place, and time. CN II-XII intact. DTRs 2+ and symmetrical biceps, radial and patellar tendons. Cerebellar function normal with no tremor, rigidity, normal gait and station.  Skin: Skin is warm and dry. No rash noted. No erythema. 8 cm well healed scar above the left scapula. No suspicious lesions head,neck, back.  Psychiatric: He has a normal mood and affect. His behavior is normal. Thought content normal.   Lab Results  Component Value Date   WBC 5.0 01/09/2011   HGB 13.3 01/09/2011   HCT 39.3 01/09/2011   PLT 125.0* 01/09/2011   CHOL 134 01/09/2011   TRIG 69.0 01/09/2011   HDL 42.80 01/09/2011   ALT 27 01/09/2011   ALT 27 01/09/2011   AST 32  01/09/2011   AST 32 01/09/2011   NA 144 01/09/2011   K 4.9 01/09/2011   CL 110 01/09/2011   CREATININE 1.6* 01/09/2011   BUN 25* 01/09/2011   CO2 24 01/09/2011   TSH 50.00* 01/09/2011   PSA 1.65 01/08/2009   INR 1.49 04/18/2010   HGBA1C  Value: 5.5 (NOTE)                                                                       According to the ADA Clinical Practice Recommendations for 2011, when HbA1c is used as a screening test:   >=6.5%   Diagnostic of Diabetes Mellitus           (if abnormal result  is confirmed)  5.7-6.4%   Increased risk of developing Diabetes Mellitus  References:Diagnosis and Classification of Diabetes Mellitus,Diabetes Care,2011,34(Suppl 1):S62-S69 and Standards of Medical Care in         Diabetes - 2011,Diabetes Care,2011,34  (Suppl 1):S11-S61. 04/14/2010          Assessment & Plan:

## 2011-01-10 DIAGNOSIS — E039 Hypothyroidism, unspecified: Secondary | ICD-10-CM | POA: Insufficient documentation

## 2011-01-10 NOTE — Assessment & Plan Note (Signed)
Interval history since his last physical - Aortic valve replacement that has gone very well with an excellent recovery. His other medical problems have been stable. Physical exam, sans prostate - deferred to urology, is normal. Lab results are in normal range except for elevated thyroid stimulating hormone (TSH). He is current with colorectal cancer screening with last study '08. Immunizations - tetnus today; pneumonia vaccine October '04. He is due for shingles vaccine.   In summary - a delightful man who is medically stable, except for thyroid issue, and doing well. He will return for lab as noted.Otherwise he will return as needed.

## 2011-01-10 NOTE — Assessment & Plan Note (Signed)
Doing extremely well after valve replacement. He is energetic and engaged.

## 2011-01-10 NOTE — Assessment & Plan Note (Signed)
Excellent control on present medication with LDL of 77  Plan - continue present regimen

## 2011-01-10 NOTE — Assessment & Plan Note (Signed)
Blood pressure is well controlled. He does have minor renal insufficiency with Cr 1.6  Plan - no diagnostics indicated           Continue on present medicaions.

## 2011-01-10 NOTE — Assessment & Plan Note (Signed)
Patient with elevated TSH suggestive of hypothyroidism. This would be a new problem. Neck exam was normal.  Plan - repeat TSH at his convenience           If truly elevated will start exogenous hormone replacement

## 2011-01-12 ENCOUNTER — Encounter: Payer: Self-pay | Admitting: Internal Medicine

## 2011-01-30 ENCOUNTER — Telehealth: Payer: Self-pay | Admitting: *Deleted

## 2011-01-30 NOTE — Telephone Encounter (Signed)
REfill request for Zolpidem tartrate 10mg  tab SIG take as needed last fill was 07/01/2010 QTY 30 Please Advise refills

## 2011-01-31 MED ORDER — ZOLPIDEM TARTRATE 10 MG PO TABS: 10.0000 mg | ORAL_TABLET | Freq: Every evening | ORAL | Status: DC | PRN

## 2011-01-31 NOTE — Telephone Encounter (Signed)
Ok for refill on zolpidem x 5

## 2011-01-31 NOTE — Telephone Encounter (Signed)
Addended byRosalio Macadamia, Rolanda Campa B on: 01/31/2011 10:43 AM   Modules accepted: Orders

## 2011-04-18 ENCOUNTER — Encounter: Payer: Self-pay | Admitting: Cardiology

## 2011-04-18 ENCOUNTER — Encounter: Payer: Self-pay | Admitting: *Deleted

## 2011-04-18 ENCOUNTER — Ambulatory Visit (INDEPENDENT_AMBULATORY_CARE_PROVIDER_SITE_OTHER): Payer: Medicare Other | Admitting: Cardiology

## 2011-04-18 DIAGNOSIS — I4510 Unspecified right bundle-branch block: Secondary | ICD-10-CM

## 2011-04-18 DIAGNOSIS — I701 Atherosclerosis of renal artery: Secondary | ICD-10-CM

## 2011-04-18 DIAGNOSIS — I252 Old myocardial infarction: Secondary | ICD-10-CM

## 2011-04-18 DIAGNOSIS — I6529 Occlusion and stenosis of unspecified carotid artery: Secondary | ICD-10-CM

## 2011-04-18 DIAGNOSIS — Z9861 Coronary angioplasty status: Secondary | ICD-10-CM

## 2011-04-18 DIAGNOSIS — E785 Hyperlipidemia, unspecified: Secondary | ICD-10-CM

## 2011-04-18 DIAGNOSIS — I359 Nonrheumatic aortic valve disorder, unspecified: Secondary | ICD-10-CM

## 2011-04-18 DIAGNOSIS — I739 Peripheral vascular disease, unspecified: Secondary | ICD-10-CM

## 2011-04-18 DIAGNOSIS — Z951 Presence of aortocoronary bypass graft: Secondary | ICD-10-CM

## 2011-04-18 DIAGNOSIS — I2510 Atherosclerotic heart disease of native coronary artery without angina pectoris: Secondary | ICD-10-CM

## 2011-04-18 NOTE — Patient Instructions (Signed)
Your physician has requested that you have a carotid duplex. This test is an ultrasound of the carotid arteries in your neck. It looks at blood flow through these arteries that supply the brain with blood. Allow one hour for this exam. There are no restrictions or special instructions.  Your physician wants you to follow-up in: 1 year with Dr. Wall. You will receive a reminder letter in the mail two months in advance. If you don't receive a letter, please call our office to schedule the follow-up appointment.  

## 2011-04-18 NOTE — Assessment & Plan Note (Signed)
Asymptomatic. Repeat carotid Dopplers. Continue current preventative measures.

## 2011-04-18 NOTE — Progress Notes (Signed)
HPI [name] returns today for the evaluation and management of his history of bioprosthetic aortic valve replacement, history of coronary disease status post prior bypass grafting, nonobstructive carotid disease, renal artery stenosis with good blood pressure control and stable creatinine, old myocardial infarction, history of hyperlipidemia and hypertension.  He is doing well without any complaints of angina or ischemic symptoms. He does a lot of manual work outside and also goes to [employer] 3 mornings a week.  He said no symptoms of TIAs or mini strokes.  EKG today shows sinus bradycardia with a first-degree AV block, right bundle branch block.  He denies orthopnea, PND or edema.  He's compliant with his medications. Past Medical History  Diagnosis Date  . Aortic valve disorders   . Postsurgical aortocoronary bypass status   . Old myocardial infarction   . Peripheral vascular disease, unspecified   . Coronary atherosclerosis of unspecified type of vessel, native or graft   . Occlusion and stenosis of carotid artery without mention of cerebral infarction   . Other and unspecified hyperlipidemia   . Cerebrovascular disease, unspecified   . Atherosclerosis of renal artery   . Postsurgical percutaneous transluminal coronary angioplasty status   . Scarlet fever   . Malignant neoplasm of bladder, part unspecified   . BPH (benign prostatic hypertrophy)   . Unspecified sleep apnea   . History of cystoscopy     w/ bladder biopsy    Past Surgical History  Procedure Date  . Cystoscopy     w/ bladder biopsy  . Percutaneous transluminal coronary angioplasty   . Coronary artery bypass graft   . Redo median sternotomy     extracorporeal circulation, aortic valve replacement using a 23-mm Mitroflow aortic pericardial heart valve.  Surgeon- [name], MD 04-18-10    Family History  Problem Relation Age of Onset  . Hypertension Mother   . Diabetes Mother   . Hyperlipidemia Mother   .  Coronary artery disease Mother   . Coronary artery disease Father   . Sudden death Father     1 wk post MI  . Heart attack Father   . Cancer Other     prostate    History   Social History  . Marital Status: Married    Spouse Name: N/A    Number of Children: N/A  . Years of Education: N/A   Occupational History  . Art gallery manager   . retired    Social History Main Topics  . Smoking status: Never Smoker   . Smokeless tobacco: Not on file  . Alcohol Use: Not on file  . Drug Use: Not on file  . Sexually Active: Not on file   Other Topics Concern  . Not on file   Social History Narrative  . No narrative on file    Allergies  Allergen Reactions  . Sulfonamide Derivatives     Current Outpatient Prescriptions  Medication Sig Dispense Refill  . amoxicillin (AMOXIL) 500 MG capsule take 4 capsules ( 2 grams) by mouth 1 hour before surgical  8 capsule  2  . aspirin 325 MG EC tablet Take 325 mg by mouth daily.        . calcium carbonate (OS-CAL) 600 MG TABS Take 600 mg by mouth daily.        . cetirizine (ZYRTEC) 10 MG tablet Take 5 mg by mouth 2 (two) times daily.        Marland Kitchen doxazosin (CARDURA) 4 MG tablet Take 2 mg by mouth  2 (two) times daily.       Marland Kitchen ezetimibe-simvastatin (VYTORIN) 10-40 MG per tablet Take 1 tablet by mouth at bedtime.  30 tablet  11  . fish oil-omega-3 fatty acids 1000 MG capsule Take 1 g by mouth 3 (three) times daily.       . metoprolol (LOPRESSOR) 50 MG tablet Take 50 mg by mouth 2 (two) times daily.        . multivitamin (THERAGRAN) tablet Take 1 tablet by mouth daily.        . nitroGLYCERIN (NITROSTAT) 0.4 MG SL tablet Place 1 tablet (0.4 mg total) under the tongue every 5 (five) minutes as needed for chest pain.  25 tablet  6  . vitamin C (ASCORBIC ACID) 500 MG tablet Take 500 mg by mouth 2 (two) times daily.        Marland Kitchen zolpidem (AMBIEN) 10 MG tablet Take 1 tablet (10 mg total) by mouth at bedtime as needed for sleep.  30 tablet  5    ROS Negative other  than HPI.   PE General Appearance: well developed, well nourished in no acute distress HEENT: symmetrical face, PERRLA, good dentition  Neck: no JVD, thyromegaly, or adenopathy, trachea midline Chest: symmetric without deformity Cardiac: PMI non-displaced, RRR, normal S1, S2 splits, soft systolic murmur that radiates into his neck versus carotid bruits Lung: clear to ausculation and percussion Vascular: all pulses full without bruits  Abdominal: nondistended, nontender, good bowel sounds, no HSM, no bruits Extremities: no cyanosis, clubbing or edema, no sign of DVT, no varicosities  Skin: normal color, no rashes Neuro: alert and oriented x 3, non-focal Pysch: normal affect Filed Vitals:   04/18/11 1537  BP: 160/74  Pulse: 54  Height: 5\' 6"  (1.676 m)  Weight: 163 lb (73.936 kg)    EKG  Labs and Studies Reviewed.   Lab Results  Component Value Date   WBC 5.0 01/09/2011   HGB 13.3 01/09/2011   HCT 39.3 01/09/2011   MCV 86.2 01/09/2011   PLT 125.0* 01/09/2011      Chemistry      Component Value Date/Time   NA 144 01/09/2011 1138   K 4.9 01/09/2011 1138   CL 110 01/09/2011 1138   CO2 24 01/09/2011 1138   BUN 25* 01/09/2011 1138   CREATININE 1.6* 01/09/2011 1138      Component Value Date/Time   CALCIUM 8.9 01/09/2011 1138   ALKPHOS 55 01/09/2011 1138   ALKPHOS 55 01/09/2011 1138   AST 32 01/09/2011 1138   AST 32 01/09/2011 1138   ALT 27 01/09/2011 1138   ALT 27 01/09/2011 1138   BILITOT 0.8 01/09/2011 1138   BILITOT 0.8 01/09/2011 1138       Lab Results  Component Value Date   CHOL 134 01/09/2011   CHOL 101 01/08/2009   CHOL 131 09/11/2006   Lab Results  Component Value Date   HDL 42.80 01/09/2011   HDL 16.10* 01/08/2009   HDL 46.1 09/11/2006   Lab Results  Component Value Date   LDLCALC 77 01/09/2011   LDLCALC 53 01/08/2009   LDLCALC 68 09/11/2006   Lab Results  Component Value Date   TRIG 69.0 01/09/2011   TRIG 57.0 01/08/2009   TRIG 85 09/11/2006   Lab Results    Component Value Date   CHOLHDL 3 01/09/2011   CHOLHDL 3 01/08/2009   CHOLHDL 2.8 CALC 09/11/2006   Lab Results  Component Value Date   HGBA1C  Value: 5.5 (NOTE)  According to the ADA Clinical Practice Recommendations for 2011, when HbA1c is used as a screening test:   >=6.5%   Diagnostic of Diabetes Mellitus           (if abnormal result  is confirmed)  5.7-6.4%   Increased risk of developing Diabetes Mellitus  References:Diagnosis and Classification of Diabetes Mellitus,Diabetes Care,2011,34(Suppl 1):S62-S69 and Standards of Medical Care in         Diabetes - 2011,Diabetes Care,2011,34  (Suppl 1):S11-S61. 04/14/2010   Lab Results  Component Value Date   ALT 27 01/09/2011   ALT 27 01/09/2011   AST 32 01/09/2011   AST 32 01/09/2011   ALKPHOS 55 01/09/2011   ALKPHOS 55 01/09/2011   BILITOT 0.8 01/09/2011   BILITOT 0.8 01/09/2011   Lab Results  Component Value Date   TSH 50.00* 01/09/2011

## 2011-04-18 NOTE — Assessment & Plan Note (Signed)
Status post replacement one year ago. Stable clinically. No indication for echocardiography. Continue current regimen and followup in one year

## 2011-04-18 NOTE — Assessment & Plan Note (Signed)
Stable. No change in treatment. 

## 2011-04-24 LAB — CBC
HCT: 36.9 — ABNORMAL LOW
HCT: 42.3
Hemoglobin: 12.4 — ABNORMAL LOW
Hemoglobin: 12.8 — ABNORMAL LOW
Hemoglobin: 14
Hemoglobin: 14.2
MCHC: 33.7
MCHC: 33.7
MCV: 88.9
MCV: 89.7
Platelets: 154
Platelets: 174
RBC: 4.24
RBC: 4.74
RBC: 4.76
RDW: 13.7
RDW: 14.2
WBC: 7.1
WBC: 7.6

## 2011-04-24 LAB — POCT I-STAT CREATININE: Operator id: 285841

## 2011-04-24 LAB — DIFFERENTIAL
Basophils Absolute: 0
Eosinophils Absolute: 0 — ABNORMAL LOW
Eosinophils Relative: 1
Lymphocytes Relative: 12
Lymphs Abs: 0.6 — ABNORMAL LOW
Monocytes Absolute: 0.5
Monocytes Absolute: 0.6
Monocytes Relative: 8
Monocytes Relative: 8
Neutro Abs: 6.7
Neutrophils Relative %: 83 — ABNORMAL HIGH

## 2011-04-24 LAB — LIPID PANEL
Cholesterol: 107
LDL Cholesterol: 54
Total CHOL/HDL Ratio: 2.4
Triglycerides: 43
VLDL: 9

## 2011-04-24 LAB — I-STAT 8, (EC8 V) (CONVERTED LAB)
Acid-base deficit: 1
Chloride: 107
Glucose, Bld: 114 — ABNORMAL HIGH
Hemoglobin: 15.3
Potassium: 4
pH, Ven: 7.362 — ABNORMAL HIGH

## 2011-04-24 LAB — CK TOTAL AND CKMB (NOT AT ARMC)
CK, MB: 97.5 — ABNORMAL HIGH
Relative Index: 13.3 — ABNORMAL HIGH
Relative Index: 3.2 — ABNORMAL HIGH
Total CK: 731 — ABNORMAL HIGH

## 2011-04-24 LAB — BASIC METABOLIC PANEL
BUN: 14
BUN: 15
CO2: 24
CO2: 24
Calcium: 8.1 — ABNORMAL LOW
Calcium: 8.5
Calcium: 9.1
Chloride: 111
Creatinine, Ser: 1.25
Creatinine, Ser: 1.29
Creatinine, Ser: 1.35
GFR calc Af Amer: >60
GFR calc non Af Amer: 51 — ABNORMAL LOW
GFR calc non Af Amer: 54 — ABNORMAL LOW
Glucose, Bld: 107 — ABNORMAL HIGH
Glucose, Bld: 139 — ABNORMAL HIGH
Potassium: 4.3
Sodium: 140

## 2011-04-24 LAB — HEPATIC FUNCTION PANEL
ALT: 26
AST: 80 — ABNORMAL HIGH
Bilirubin, Direct: 0.2
Indirect Bilirubin: 0.9
Total Bilirubin: 1.1

## 2011-04-24 LAB — CARDIAC PANEL(CRET KIN+CKTOT+MB+TROPI)
CK, MB: 5.8 — ABNORMAL HIGH
Relative Index: 3.2 — ABNORMAL HIGH
Relative Index: 7.3 — ABNORMAL HIGH
Total CK: 182
Total CK: 337 — ABNORMAL HIGH
Total CK: 387 — ABNORMAL HIGH
Total CK: 521 — ABNORMAL HIGH
Total CK: 655 — ABNORMAL HIGH
Troponin I: 5.08
Troponin I: 8.69
Troponin I: 9.19

## 2011-04-24 LAB — POCT CARDIAC MARKERS
CKMB, poc: 22.5
CKMB, poc: 5.4
Myoglobin, poc: >500
Myoglobin, poc: >500
Operator id: 285841
Operator id: 285841
Troponin i, poc: 1.03

## 2011-04-24 LAB — TROPONIN I
Troponin I: 0.25 — ABNORMAL HIGH
Troponin I: 26.01

## 2011-04-24 LAB — PROTIME-INR
INR: 1
Prothrombin Time: 14.1

## 2011-05-11 ENCOUNTER — Encounter (INDEPENDENT_AMBULATORY_CARE_PROVIDER_SITE_OTHER): Payer: Medicare Other | Admitting: Cardiology

## 2011-05-11 DIAGNOSIS — I6529 Occlusion and stenosis of unspecified carotid artery: Secondary | ICD-10-CM

## 2011-05-30 ENCOUNTER — Other Ambulatory Visit: Payer: Self-pay | Admitting: Dermatology

## 2011-05-31 ENCOUNTER — Ambulatory Visit (INDEPENDENT_AMBULATORY_CARE_PROVIDER_SITE_OTHER): Payer: Medicare Other

## 2011-05-31 DIAGNOSIS — Z23 Encounter for immunization: Secondary | ICD-10-CM

## 2011-06-27 ENCOUNTER — Other Ambulatory Visit: Payer: Self-pay | Admitting: Cardiology

## 2011-07-04 IMAGING — CR DG CHEST 2V
2 series · 2 of 2 positions shown · non-contrast
Comparison: 04/20/2010.

CLINICAL DATA: Persistent cough with pain and shortness of breath.

CHEST - 2 VIEW

[w chest pa]
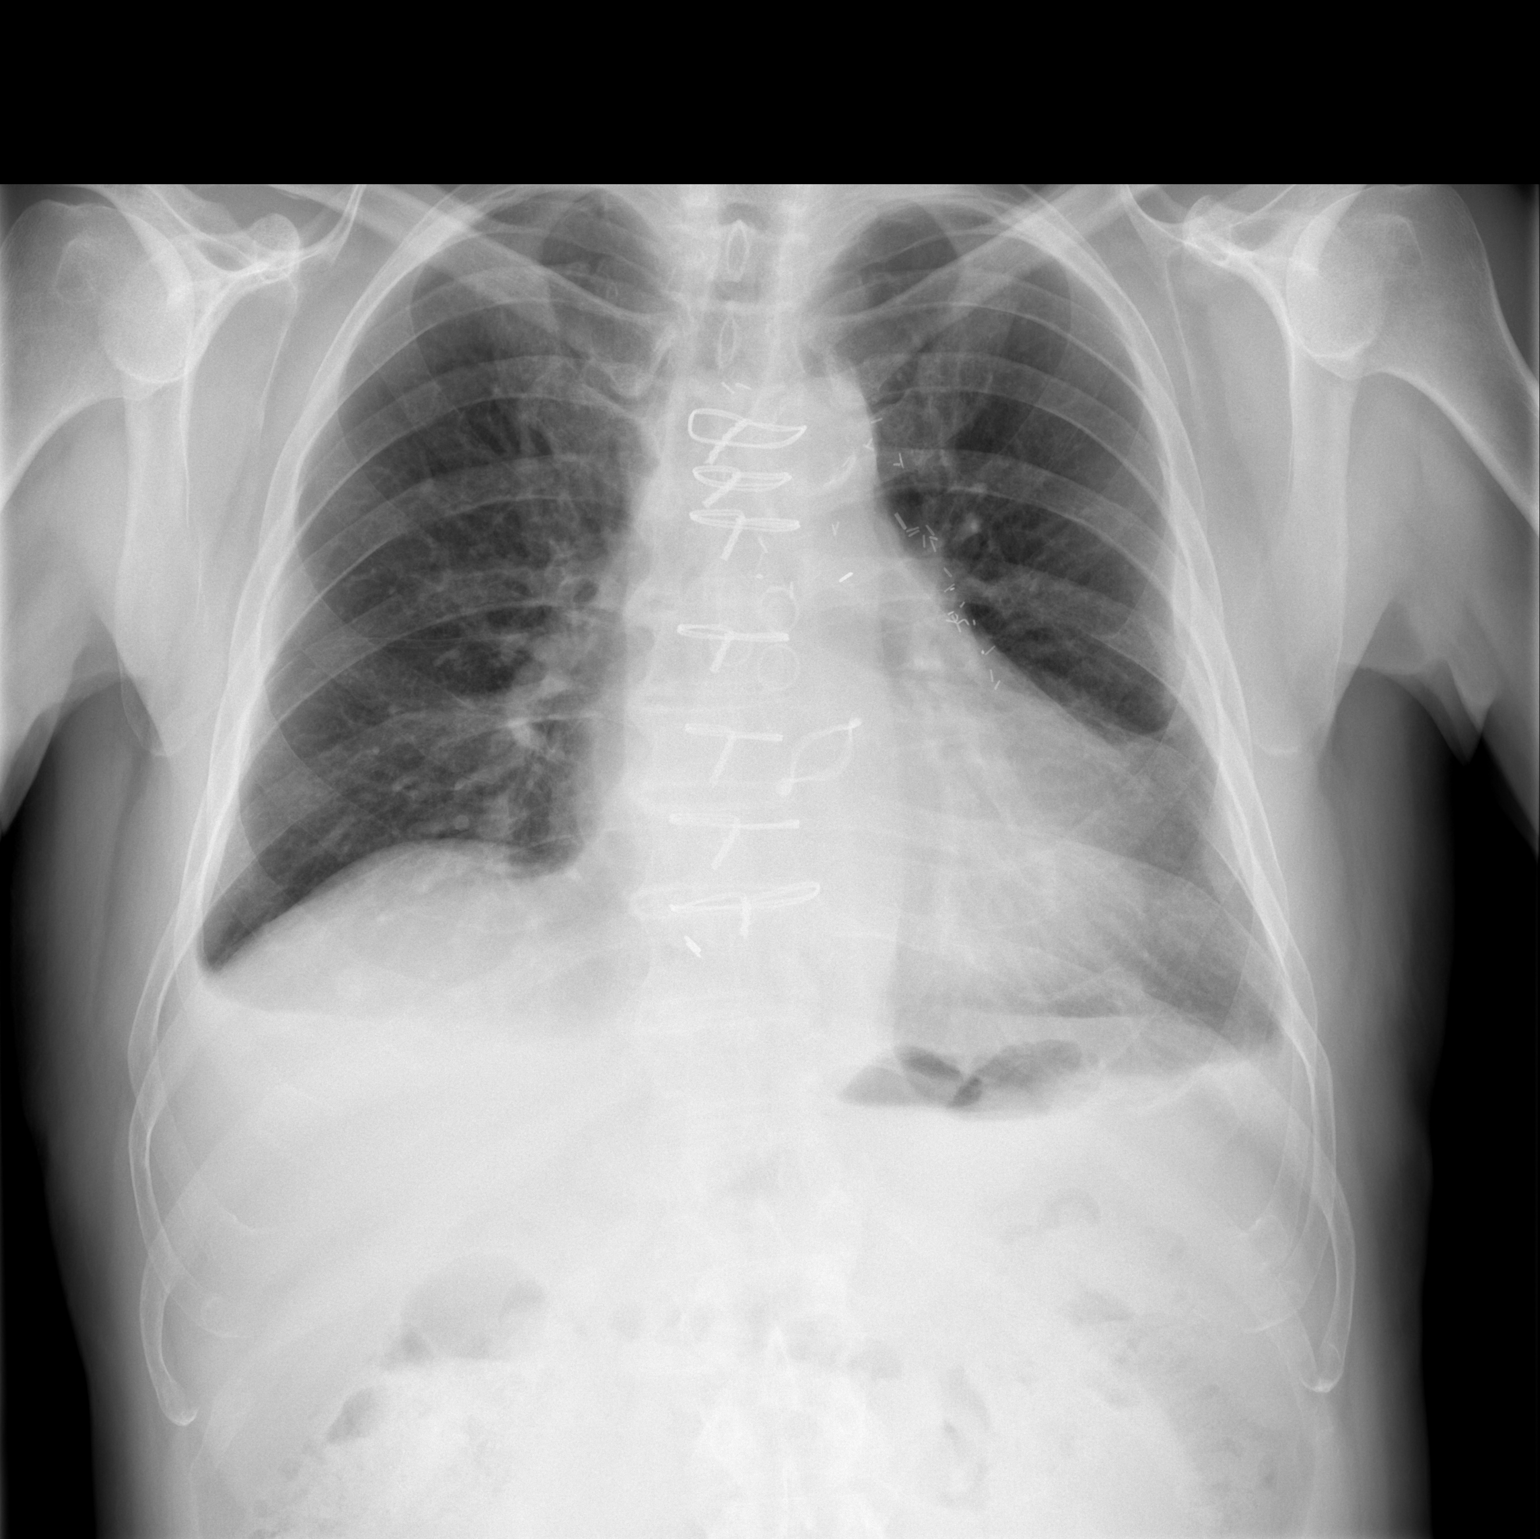

[w chest lat]
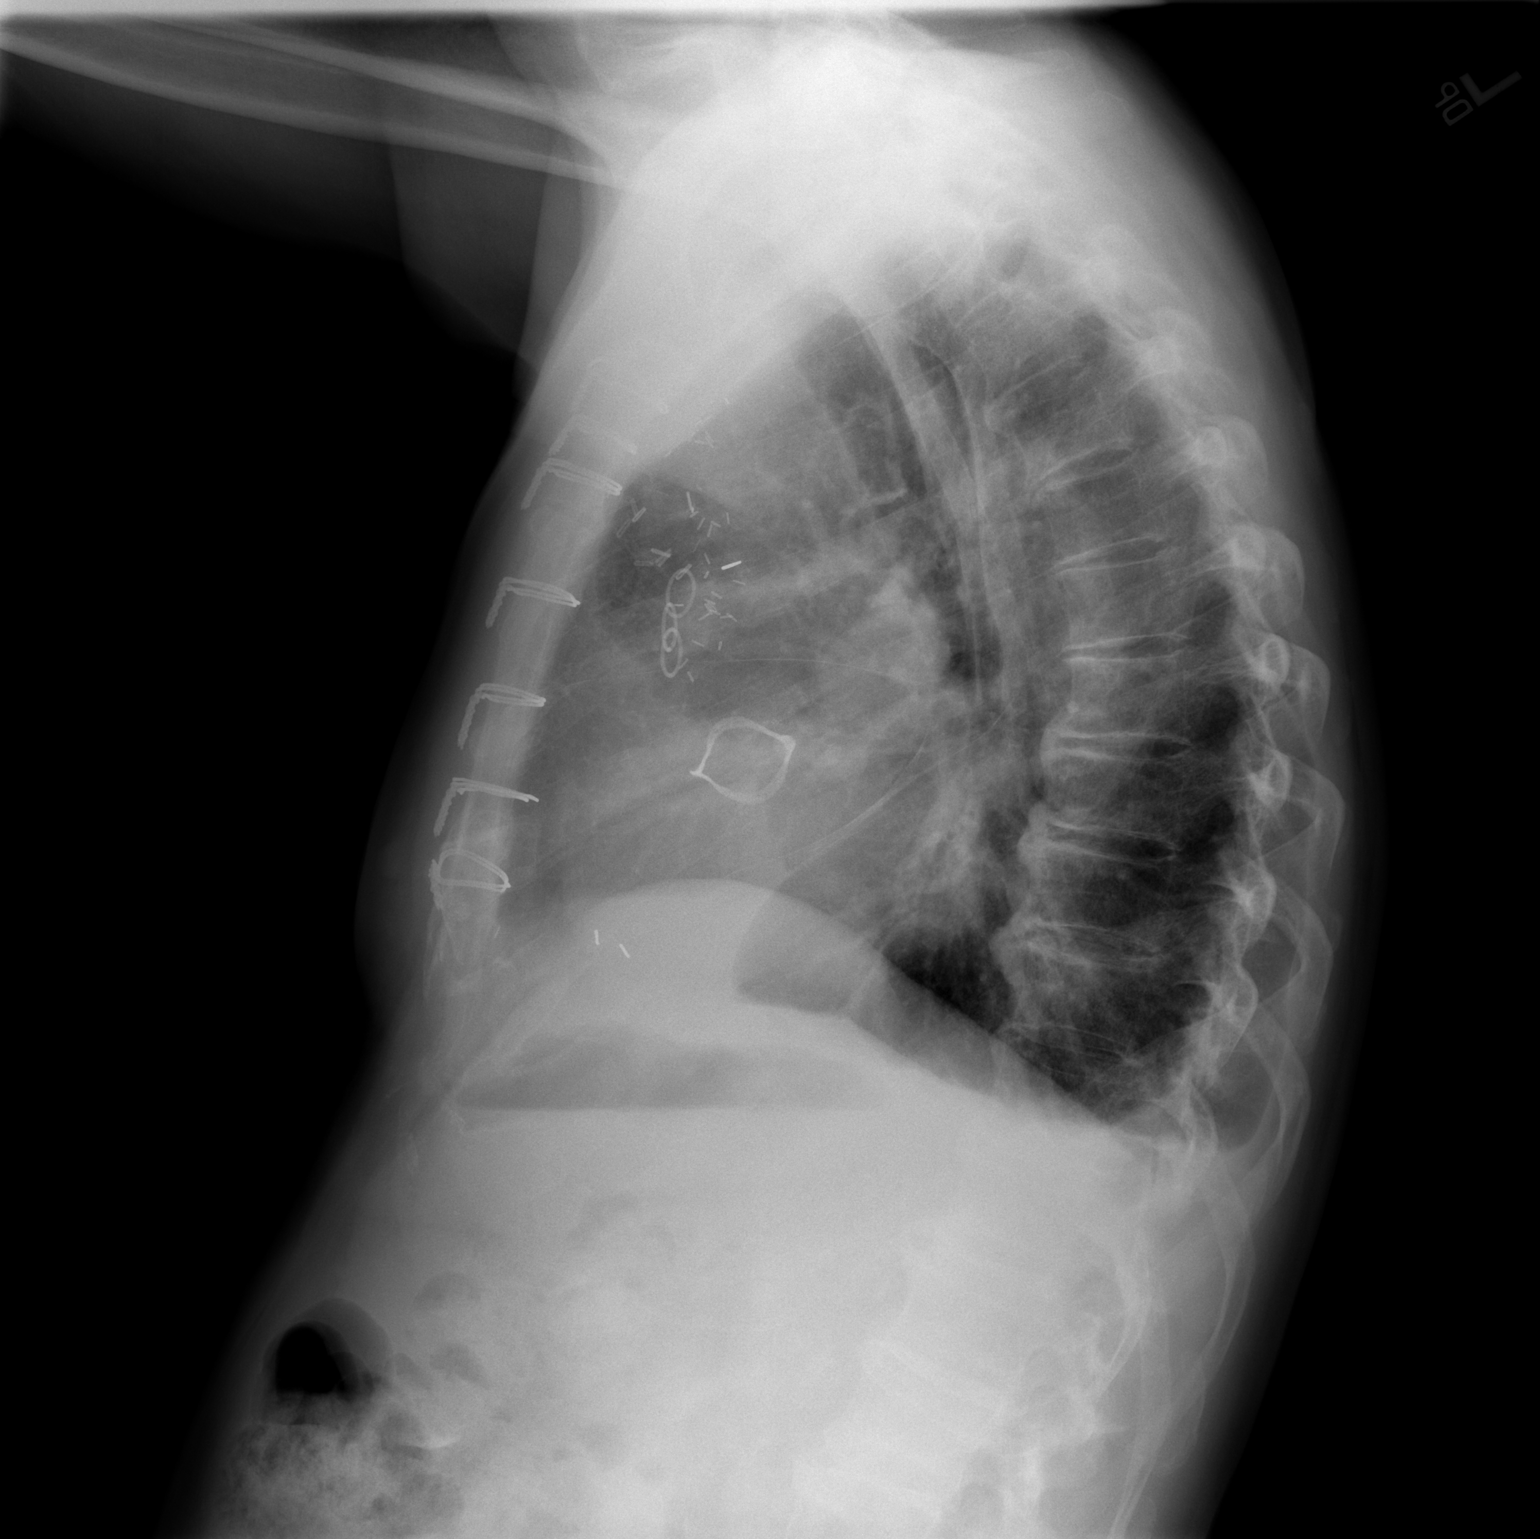

[2 of 2 positions shown; findings below may reference images not displayed]

FINDINGS: Trachea is midline.  Heart size stable.  Thoracic aorta
is calcified.  Sternotomy wires are stable in position.  Epicardial
pacer wires have been removed.

Small bilateral pleural effusions.  No edema.
IMPRESSION: Small bilateral pleural effusions.

## 2011-12-10 ENCOUNTER — Other Ambulatory Visit: Payer: Self-pay | Admitting: Cardiology

## 2012-01-10 ENCOUNTER — Encounter: Payer: Medicare Other | Admitting: Internal Medicine

## 2012-01-30 ENCOUNTER — Other Ambulatory Visit: Payer: Self-pay | Admitting: Dermatology

## 2012-02-08 ENCOUNTER — Other Ambulatory Visit (INDEPENDENT_AMBULATORY_CARE_PROVIDER_SITE_OTHER): Payer: Medicare Other

## 2012-02-08 ENCOUNTER — Encounter: Payer: Self-pay | Admitting: Internal Medicine

## 2012-02-08 ENCOUNTER — Ambulatory Visit (INDEPENDENT_AMBULATORY_CARE_PROVIDER_SITE_OTHER): Payer: Medicare Other | Admitting: Internal Medicine

## 2012-02-08 VITALS — BP 142/68 | HR 70 | Temp 98.3°F | Resp 16 | Wt 152.0 lb

## 2012-02-08 DIAGNOSIS — G4730 Sleep apnea, unspecified: Secondary | ICD-10-CM

## 2012-02-08 DIAGNOSIS — E785 Hyperlipidemia, unspecified: Secondary | ICD-10-CM

## 2012-02-08 DIAGNOSIS — I359 Nonrheumatic aortic valve disorder, unspecified: Secondary | ICD-10-CM

## 2012-02-08 DIAGNOSIS — Z23 Encounter for immunization: Secondary | ICD-10-CM | POA: Insufficient documentation

## 2012-02-08 DIAGNOSIS — I679 Cerebrovascular disease, unspecified: Secondary | ICD-10-CM

## 2012-02-08 DIAGNOSIS — E039 Hypothyroidism, unspecified: Secondary | ICD-10-CM

## 2012-02-08 DIAGNOSIS — Z0000 Encounter for general adult medical examination without abnormal findings: Secondary | ICD-10-CM

## 2012-02-08 DIAGNOSIS — I6529 Occlusion and stenosis of unspecified carotid artery: Secondary | ICD-10-CM

## 2012-02-08 DIAGNOSIS — I739 Peripheral vascular disease, unspecified: Secondary | ICD-10-CM

## 2012-02-08 DIAGNOSIS — I2510 Atherosclerotic heart disease of native coronary artery without angina pectoris: Secondary | ICD-10-CM

## 2012-02-08 LAB — HEPATIC FUNCTION PANEL
AST: 29 U/L (ref 0–37)
Alkaline Phosphatase: 47 U/L (ref 39–117)
Bilirubin, Direct: 0.1 mg/dL (ref 0.0–0.3)

## 2012-02-08 LAB — COMPREHENSIVE METABOLIC PANEL
BUN: 29 mg/dL — ABNORMAL HIGH (ref 6–23)
CO2: 25 mEq/L (ref 19–32)
Calcium: 9 mg/dL (ref 8.4–10.5)
Chloride: 110 mEq/L (ref 96–112)
Creatinine, Ser: 1.5 mg/dL (ref 0.4–1.5)
GFR: 49.27 mL/min — ABNORMAL LOW (ref >60.00)
Total Bilirubin: 0.6 mg/dL (ref 0.3–1.2)

## 2012-02-08 LAB — LIPID PANEL
Cholesterol: 112 mg/dL (ref 0–200)
HDL: 45 mg/dL (ref >39.00)
LDL Cholesterol: 43 mg/dL (ref 0–99)
Triglycerides: 120 mg/dL (ref 0.0–149.0)

## 2012-02-08 NOTE — Progress Notes (Signed)
Subjective:    Patient ID: [name], male    DOB: [birth year], 78 y.o.   MRN: [id number]  HPI The patient is here for annual Medicare wellness examination and management of other chronic and acute problems. He has been feeling well. He has had no major medical illness, no surgeries except for removal of a skin lesion from the abdomen. Last year he did have a melanoma removed from his left scapular region.    The risk factors are reflected in the social history.  The roster of all physicians providing medical care to patient - is listed in the Snapshot section of the chart.  Activities of daily living:  The patient is 100% inedpendent in all ADLs: dressing, toileting, feeding as well as independent mobility  Home safety : The patient has smoke detectors in the home. They wear seatbelts. firearms are present in the home, kept in a safe fashion. There is no violence in the home.   There is no risks for hepatitis, STDs or HIV. There is no   history of blood transfusion. They have no travel history to infectious disease endemic areas of the world.  The patient has seen their dentist in the last six month. They have seen their eye doctor in the last year. They admit to any hearing difficulty and have not had audiologic testing in the last year.  They do not  have excessive sun exposure. Discussed the need for sun protection: hats, long sleeves and use of sunscreen if there is significant sun exposure.   Diet: the importance of a healthy diet is discussed. They do have a healthy diet.  The patient has a regular exercise program: Nautilus workout, 90 min duration, 3 per week.  The benefits of regular aerobic exercise were discussed.  Depression screen: there are no signs or vegative symptoms of depression- irritability, change in appetite, anhedonia, sadness/tearfullness.  Cognitive assessment: the patient manages all their financial and personal affairs and is actively engaged. He is having  some memory problems.  The following portions of the patient's history were reviewed and updated as appropriate: allergies, current medications, past family history, past medical history,  past surgical history, past social history  and problem list.  Vision, hearing, body mass index were assessed and reviewed.   During the course of the visit the patient was educated and counseled about appropriate screening and preventive services including : fall prevention , diabetes screening, nutrition counseling, colorectal cancer screening, and recommended immunizations.  Past Medical History  Diagnosis Date  . Aortic valve disorders   . Postsurgical aortocoronary bypass status   . Old myocardial infarction   . Peripheral vascular disease, unspecified   . Coronary atherosclerosis of unspecified type of vessel, native or graft   . Occlusion and stenosis of carotid artery without mention of cerebral infarction   . Other and unspecified hyperlipidemia   . Cerebrovascular disease, unspecified   . Atherosclerosis of renal artery   . Postsurgical percutaneous transluminal coronary angioplasty status   . Scarlet fever   . Malignant neoplasm of bladder, part unspecified   . BPH (benign prostatic hypertrophy)   . Unspecified sleep apnea   . History of cystoscopy     w/ bladder biopsy  . Melanoma in situ of back 2012    area of left scapula   Past Surgical History  Procedure Date  . Cystoscopy     w/ bladder biopsy  . Percutaneous transluminal coronary angioplasty   . Coronary artery bypass graft   .  Redo median sternotomy     extracorporeal circulation, aortic valve replacement using a 23-mm Mitroflow aortic pericardial heart valve.  Surgeon- [name], MD 04-18-10  . Melanoma excision 2012    left scapular  area - clean margins.    Family History  Problem Relation Age of Onset  . Hypertension Mother   . Diabetes Mother   . Hyperlipidemia Mother   . Coronary artery disease Mother   .  Coronary artery disease Father   . Sudden death Father     1 wk post MI  . Heart attack Father   . Cancer Other     prostate   History   Social History  . Marital Status: Married    Spouse Name: N/A    Number of Children: N/A  . Years of Education: N/A   Occupational History  . Art gallery manager   . retired    Social History Main Topics  . Smoking status: Never Smoker   . Smokeless tobacco: Not on file  . Alcohol Use: Not on file  . Drug Use: Not on file  . Sexually Active: Not on file   Other Topics Concern  . Not on file   Social History Narrative  . No narrative on file    Current Outpatient Prescriptions on File Prior to Visit  Medication Sig Dispense Refill  . aspirin 325 MG EC tablet Take 325 mg by mouth daily.        . calcium carbonate (OS-CAL) 600 MG TABS Take 600 mg by mouth daily.        Marland Kitchen doxazosin (CARDURA) 4 MG tablet Take 2 mg by mouth 2 (two) times daily.       . fish oil-omega-3 fatty acids 1000 MG capsule Take 1 g by mouth 3 (three) times daily.       . metoprolol (LOPRESSOR) 50 MG tablet TAKE ONE TABLET BY MOUTH TWICE A DAY  180 tablet  3  . multivitamin (THERAGRAN) tablet Take 1 tablet by mouth daily.        . vitamin C (ASCORBIC ACID) 500 MG tablet Take 500 mg by mouth 2 (two) times daily.        Marland Kitchen VYTORIN 10-40 MG per tablet TAKE 1 TABLET BY MOUTH AT BEDTIME.  90 tablet  2  . nitroGLYCERIN (NITROSTAT) 0.4 MG SL tablet Place 1 tablet (0.4 mg total) under the tongue every 5 (five) minutes as needed for chest pain.  25 tablet  6  . zolpidem (AMBIEN) 10 MG tablet Take 1 tablet (10 mg total) by mouth at bedtime as needed for sleep.  30 tablet  5      Review of Systems Constitutional:  Negative for fever, chills, activity change and unexpected weight change.  HEENT:  Negative for hearing loss, ear pain, congestion, neck stiffness and postnasal drip. Negative for sore throat or swallowing problems. Negative for dental complaints.   Eyes: Negative for vision  loss or change in visual acuity.  Respiratory: Negative for chest tightness and wheezing. Negative for DOE.   Cardiovascular: Negative for chest pain or palpitations. No decreased exercise tolerance Gastrointestinal: No change in bowel habit. No bloating or gas. No reflux or indigestion Genitourinary: Negative for urgency, frequency, flank pain and difficulty urinating.  Musculoskeletal: Negative for myalgias, back pain, arthralgias and gait problem except for intermittent stiffness in the PIP 3rd, 4th digits right hand.  Neurological: Negative for dizziness, tremors, weakness and headaches.  Hematological: Negative for adenopathy.  Psychiatric/Behavioral: Negative for behavioral  problems and dysphoric mood.       Objective:   Physical Exam Filed Vitals:   02/08/12 1448  BP: 142/68  Pulse: 70  Temp: 98.3 F (36.8 C)  Resp: 16   Wt Readings from Last 3 Encounters:  02/08/12 152 lb (68.947 kg)  04/18/11 163 lb (73.936 kg)  01/09/11 155 lb 4 oz (70.421 kg)   Gen'l: Well nourished well developed white male in no acute distress  HEENT: Head: Normocephalic and atraumatic. Right Ear: External ear normal. EAC/TM nl. Left Ear: External ear normal.  EAC/TM nl. Nose: Nose normal. Mouth/Throat: Oropharynx is clear and moist. Dentition - native, in good repair. No buccal or palatal lesions. Posterior pharynx clear. Eyes: Conjunctivae and sclera clear. EOM intact. Pupils are equal, round, and reactive to light. Right eye exhibits no discharge. Left eye exhibits no discharge. Neck: Normal range of motion. Neck supple. No JVD present. No tracheal deviation present. No thyromegaly present.  Cardiovascular: Normal rate, regular rhythm, no gallop, no friction rub, no murmur heard.      Quiet precordium. 2+ radial and DP pulses . No carotid bruits Pulmonary/Chest: Effort normal. No respiratory distress or increased WOB, no wheezes, no rales. No chest wall deformity or CVAT. Well healed sternotomy  scar. Abdomen: Soft. Bowel sounds are normal in all quadrants. He exhibits no distension, no tenderness, no rebound or guarding, No heptosplenomegaly  Genitourinary:  deferred Musculoskeletal: Normal range of motion. He exhibits no edema and no tenderness.       Small and large joints without redness, synovial thickening or deformity. Full range of motion preserved about all small, median and large joints.  Lymphadenopathy:    He has no cervical or supraclavicular adenopathy.  Neurological: He is alert and oriented to person, place, and time. CN II-XII intact. DTRs 2+ and symmetrical biceps, radial and patellar tendons. Cerebellar function normal with no tremor, rigidity, normal gait and station.  Skin: Skin is warm and dry. No rash noted. No erythema.  Psychiatric: He has a normal mood and affect. His behavior is normal. Thought content normal.   Lab Results  Component Value Date   WBC 5.0 01/09/2011   HGB 13.3 01/09/2011   HCT 39.3 01/09/2011   PLT 125.0* 01/09/2011   GLUCOSE 101* 02/08/2012   CHOL 112 02/08/2012   TRIG 120.0 02/08/2012   HDL 45.00 02/08/2012   LDLCALC 43 02/08/2012        ALT 21 02/08/2012   AST 29 02/08/2012        NA 142 02/08/2012   K 4.3 02/08/2012   CL 110 02/08/2012   CREATININE 1.5 02/08/2012   BUN 29* 02/08/2012   CO2 25 02/08/2012   TSH 3.40 02/08/2012   PSA 1.65 01/08/2009   INR 1.49 04/18/2010   HGBA1C  5.5                                             04/14/2010           Assessment & Plan:

## 2012-02-11 ENCOUNTER — Encounter: Payer: Self-pay | Admitting: Internal Medicine

## 2012-02-11 NOTE — Assessment & Plan Note (Addendum)
Known RAS by cardiac cath August '11.

## 2012-02-11 NOTE — Assessment & Plan Note (Signed)
Stable with no chest pain or discomfort. No limitations in activity.  Plan - continue present risk reduction regimen.

## 2012-02-11 NOTE — Assessment & Plan Note (Signed)
Continues to use CPAP - does well, sleeps better. Last visit to pulmonary/sleep medicine June '08.  Plan continue CPAP  Consider follow-up appointment with sleep medicine for routine check up.

## 2012-02-11 NOTE — Assessment & Plan Note (Signed)
Lab Results  Component Value Date   TSH 3.40 02/08/2012   Normal thyroid function with no medications

## 2012-02-11 NOTE — Assessment & Plan Note (Signed)
Last Carotid Study Oct 25, '12: Stable bilateral ICA disease @ 40-59% occlusion  Plan Continue cardiac risk reduction regimen

## 2012-02-11 NOTE — Assessment & Plan Note (Signed)
LDL cholesterol better than goal of 80 or less and HDL better than goal of 40 or higher. Liver functions are normal  Plan - continue present medications.

## 2012-02-11 NOTE — Assessment & Plan Note (Signed)
S/p AoVR - bioprosthetic (bovine) pericardial October '11. He is doing well with close follow-up by Drs. Wall and Bartle.

## 2012-02-11 NOTE — Assessment & Plan Note (Signed)
Interval history - he has been doing very well w/o major medical problems. Physical exam is normal. Lab results are in normal range. He is current with colorectal cancer screening and has aged out from prostate cancer screening. Immunizations are current except for missing date on shingles vaccine.  In summary - a very nice man with a remarkable medical history who is doing well. He is encouraged to continue his active life-style and healthy habits. He will return in 1 year or sooner as needed.

## 2012-02-15 ENCOUNTER — Other Ambulatory Visit: Payer: Self-pay | Admitting: *Deleted

## 2012-02-15 MED ORDER — NITROGLYCERIN 0.4 MG SL SUBL: 0.4000 mg | SUBLINGUAL_TABLET | SUBLINGUAL | Status: DC | PRN

## 2012-04-17 ENCOUNTER — Ambulatory Visit (INDEPENDENT_AMBULATORY_CARE_PROVIDER_SITE_OTHER): Payer: Medicare Other | Admitting: Cardiology

## 2012-04-17 ENCOUNTER — Encounter: Payer: Self-pay | Admitting: Cardiology

## 2012-04-17 VITALS — BP 131/64 | HR 52 | Ht 66.0 in | Wt 153.2 lb

## 2012-04-17 DIAGNOSIS — I498 Other specified cardiac arrhythmias: Secondary | ICD-10-CM

## 2012-04-17 DIAGNOSIS — I2510 Atherosclerotic heart disease of native coronary artery without angina pectoris: Secondary | ICD-10-CM

## 2012-04-17 DIAGNOSIS — R001 Bradycardia, unspecified: Secondary | ICD-10-CM | POA: Insufficient documentation

## 2012-04-17 DIAGNOSIS — I739 Peripheral vascular disease, unspecified: Secondary | ICD-10-CM

## 2012-04-17 DIAGNOSIS — R0609 Other forms of dyspnea: Secondary | ICD-10-CM

## 2012-04-17 DIAGNOSIS — I779 Disorder of arteries and arterioles, unspecified: Secondary | ICD-10-CM

## 2012-04-17 DIAGNOSIS — I6529 Occlusion and stenosis of unspecified carotid artery: Secondary | ICD-10-CM

## 2012-04-17 DIAGNOSIS — Z951 Presence of aortocoronary bypass graft: Secondary | ICD-10-CM

## 2012-04-17 DIAGNOSIS — I4510 Unspecified right bundle-branch block: Secondary | ICD-10-CM

## 2012-04-17 DIAGNOSIS — R0600 Dyspnea, unspecified: Secondary | ICD-10-CM | POA: Insufficient documentation

## 2012-04-17 DIAGNOSIS — I679 Cerebrovascular disease, unspecified: Secondary | ICD-10-CM

## 2012-04-17 DIAGNOSIS — I359 Nonrheumatic aortic valve disorder, unspecified: Secondary | ICD-10-CM

## 2012-04-17 MED ORDER — METOPROLOL TARTRATE 50 MG PO TABS: 25.0000 mg | ORAL_TABLET | Freq: Two times a day (BID) | ORAL | Status: DC

## 2012-04-17 NOTE — Assessment & Plan Note (Signed)
He is up for carotid Dopplers. Schedule same day as his echocardiogram.

## 2012-04-17 NOTE — Progress Notes (Signed)
HPI [name] comes in today with chief complaint of increased dyspnea on exertion. It usually happens when he first gets out and starts doing something. It is relieved with rest fairly quickly. It is not as bad after the first initial effort. He denies syncope or presyncope. Had no angina.  Past Medical History  Diagnosis Date  . Aortic valve disorders   . Postsurgical aortocoronary bypass status   . Old myocardial infarction   . Peripheral vascular disease, unspecified   . Coronary atherosclerosis of unspecified type of vessel, native or graft   . Occlusion and stenosis of carotid artery without mention of cerebral infarction   . Other and unspecified hyperlipidemia   . Cerebrovascular disease, unspecified   . Atherosclerosis of renal artery   . Postsurgical percutaneous transluminal coronary angioplasty status   . Scarlet fever   . Malignant neoplasm of bladder, part unspecified   . BPH (benign prostatic hypertrophy)   . Unspecified sleep apnea   . History of cystoscopy     w/ bladder biopsy  . Melanoma in situ of back 2012    area of left scapula    Current Outpatient Prescriptions  Medication Sig Dispense Refill  . aspirin 325 MG EC tablet Take 325 mg by mouth daily.        . calcium carbonate (OS-CAL) 600 MG TABS Take 600 mg by mouth daily.        Marland Kitchen doxazosin (CARDURA) 4 MG tablet Take 2 mg by mouth 2 (two) times daily.       . fish oil-omega-3 fatty acids 1000 MG capsule Take 1 g by mouth 3 (three) times daily.       . metoprolol (LOPRESSOR) 50 MG tablet Take 0.5 tablets (25 mg total) by mouth 2 (two) times daily.  180 tablet  3  . multivitamin (THERAGRAN) tablet Take 1 tablet by mouth daily.        . nitroGLYCERIN (NITROSTAT) 0.4 MG SL tablet Place 1 tablet (0.4 mg total) under the tongue every 5 (five) minutes as needed for chest pain.  25 tablet  6  . vitamin C (ASCORBIC ACID) 500 MG tablet Take 500 mg by mouth 2 (two) times daily.        Marland Kitchen VYTORIN 10-40 MG per tablet  TAKE 1 TABLET BY MOUTH AT BEDTIME.  90 tablet  2  . zolpidem (AMBIEN) 10 MG tablet Take 10 mg by mouth at bedtime as needed.      Marland Kitchen DISCONTD: metoprolol (LOPRESSOR) 50 MG tablet TAKE ONE TABLET BY MOUTH TWICE A DAY  180 tablet  3  . DISCONTD: zolpidem (AMBIEN) 10 MG tablet Take 1 tablet (10 mg total) by mouth at bedtime as needed for sleep.  30 tablet  5    Allergies  Allergen Reactions  . Sulfonamide Derivatives     Family History  Problem Relation Age of Onset  . Hypertension Mother   . Diabetes Mother   . Hyperlipidemia Mother   . Coronary artery disease Mother   . Heart disease Mother     CAD, cardiomyopathy  . Coronary artery disease Father   . Sudden death Father     1 wk post MI  . Heart attack Father   . Heart disease Father     CAD/MI-sudden death  . Cancer Other     prostate  . Birth defects Neg Hx     History   Social History  . Marital Status: Married    Spouse Name: N/A  Number of Children: 3  . Years of Education: 17   Occupational History  . Art gallery manager   . retired    Social History Main Topics  . Smoking status: Never Smoker   . Smokeless tobacco: Never Used  . Alcohol Use: No  . Drug Use: No  . Sexually Active: Not Currently   Other Topics Concern  . Not on file   Social History Narrative   HSG, [employer], did some graduate work. Married '52. 1 son - '61; 2 daughters -'40, '63: grandchildren 7; great-grands 6. 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 Active gardner, Licensed conveyancer. good marriage.    ROS ALL NEGATIVE EXCEPT THOSE NOTED IN HPI  PE  General Appearance: well developed, well nourished in no acute distress HEENT: symmetrical face, PERRLA, good dentition  Neck: no JVD, thyromegaly, or adenopathy, trachea midline Chest: symmetric without deformity Cardiac: PMI non-displaced, RRR, normal S1, difficult to hear S2 split, systolic murmur left sternal border consistent with aortic stenosis. No aortic insufficiency  heard. Lung: clear to ausculation and percussion Vascular: all pulses full, right carotid bruit Abdominal: nondistended, nontender, good bowel sounds, no HSM, no bruits Extremities: no cyanosis, clubbing or edema, no sign of DVT, no varicosities  Skin: normal color, no rashes Neuro: alert and oriented x 3, non-focal Pysch: normal affect  EKG Sinus bradycardia with first-degree block, right bundle branch block BMET    Component Value Date/Time   NA 142 02/08/2012 1548   K 4.3 02/08/2012 1548   CL 110 02/08/2012 1548   CO2 25 02/08/2012 1548   GLUCOSE 101* 02/08/2012 1548   BUN 29* 02/08/2012 1548   CREATININE 1.5 02/08/2012 1548   CALCIUM 9.0 02/08/2012 1548   GFRNONAA 43* 04/22/2010 0445   GFRAA  Value: 53        The eGFR has been calculated using the MDRD equation. This calculation has not been validated in all clinical situations. eGFR's persistently <60 mL/min signify possible Chronic Kidney Disease.* 04/22/2010 0445    Lipid Panel     Component Value Date/Time   CHOL 112 02/08/2012 1548   TRIG 120.0 02/08/2012 1548   HDL 45.00 02/08/2012 1548   CHOLHDL 2 02/08/2012 1548   VLDL 24.0 02/08/2012 1548   LDLCALC 43 02/08/2012 1548    CBC    Component Value Date/Time   WBC 5.0 01/09/2011 1138   RBC 4.56 01/09/2011 1138   HGB 13.3 01/09/2011 1138   HCT 39.3 01/09/2011 1138   PLT 125.0* 01/09/2011 1138   MCV 86.2 01/09/2011 1138   MCH 29.4 04/22/2010 0445   MCHC 34.0 01/09/2011 1138   RDW 16.8* 01/09/2011 1138   LYMPHSABS 1.4 01/09/2011 1138   MONOABS 0.6 01/09/2011 1138   EOSABS 0.1 01/09/2011 1138   BASOSABS 0.0 01/09/2011 1138

## 2012-04-17 NOTE — Patient Instructions (Addendum)
Your physician has requested that you have a carotid duplex. This test is an ultrasound of the carotid arteries in your neck. It looks at blood flow through these arteries that supply the brain with blood. Allow one hour for this exam. There are no restrictions or special instructions.  Your physician has requested that you have an echocardiogram. Echocardiography is a painless test that uses sound waves to create images of your heart. It provides your doctor with information about the size and shape of your heart and how well your heart's chambers and valves are working. This procedure takes approximately one hour. There are no restrictions for this procedure.  Your physician has recommended you make the following change in your medication: Decrease Lopressor 25 mg twice a day  Your physician wants you to follow-up in: 1 year with Dr. Dorinda Hill will receive a reminder letter in the mail two months in advance. If you don't receive a letter, please call our office to schedule the follow-up appointment.

## 2012-04-17 NOTE — Assessment & Plan Note (Signed)
This could be contributing to his dyspnea on exertion. We'll cut his Lopressor to 25 mg twice a day.

## 2012-04-17 NOTE — Assessment & Plan Note (Signed)
This sounds worse over the past several months. Will arrange for an echocardiogram to assess his prosthetic aortic valve and LV function. It does not sound like his angina or ischemia. I've asked him to pace himself a little more when he first starts out.

## 2012-04-24 ENCOUNTER — Other Ambulatory Visit (HOSPITAL_COMMUNITY): Payer: Medicare Other

## 2012-04-30 ENCOUNTER — Ambulatory Visit: Payer: Medicare Other

## 2012-04-30 ENCOUNTER — Encounter (INDEPENDENT_AMBULATORY_CARE_PROVIDER_SITE_OTHER): Payer: Medicare Other

## 2012-04-30 DIAGNOSIS — I6529 Occlusion and stenosis of unspecified carotid artery: Secondary | ICD-10-CM

## 2012-04-30 DIAGNOSIS — I779 Disorder of arteries and arterioles, unspecified: Secondary | ICD-10-CM

## 2012-05-02 ENCOUNTER — Ambulatory Visit (HOSPITAL_COMMUNITY): Payer: Medicare Other | Attending: Cardiology | Admitting: Radiology

## 2012-05-02 DIAGNOSIS — I379 Nonrheumatic pulmonary valve disorder, unspecified: Secondary | ICD-10-CM | POA: Insufficient documentation

## 2012-05-02 DIAGNOSIS — R0989 Other specified symptoms and signs involving the circulatory and respiratory systems: Secondary | ICD-10-CM | POA: Insufficient documentation

## 2012-05-02 DIAGNOSIS — R0609 Other forms of dyspnea: Secondary | ICD-10-CM | POA: Insufficient documentation

## 2012-05-02 DIAGNOSIS — R0600 Dyspnea, unspecified: Secondary | ICD-10-CM

## 2012-05-02 DIAGNOSIS — I059 Rheumatic mitral valve disease, unspecified: Secondary | ICD-10-CM | POA: Insufficient documentation

## 2012-05-02 DIAGNOSIS — I369 Nonrheumatic tricuspid valve disorder, unspecified: Secondary | ICD-10-CM | POA: Insufficient documentation

## 2012-05-02 NOTE — Progress Notes (Signed)
Echocardiogram performed.  

## 2012-05-03 ENCOUNTER — Telehealth: Payer: Self-pay | Admitting: *Deleted

## 2012-05-03 DIAGNOSIS — I359 Nonrheumatic aortic valve disorder, unspecified: Secondary | ICD-10-CM

## 2012-05-03 DIAGNOSIS — I2510 Atherosclerotic heart disease of native coronary artery without angina pectoris: Secondary | ICD-10-CM

## 2012-05-03 MED ORDER — LISINOPRIL 10 MG PO TABS: 10.0000 mg | ORAL_TABLET | Freq: Every day | ORAL | Status: DC

## 2012-05-03 NOTE — Telephone Encounter (Signed)
I spoke with pt today about results of carotid and echocardiogram. He is not allergic to ACE inhibitors. Will start Lisinopril 10 mg daily tomorrow.  Repeat BMP on 05/13/12 with nurse room visit for blood pressure check. He will also bring a copy of his blood pressure log. Mylo Red RN

## 2012-05-03 NOTE — Telephone Encounter (Signed)
Message copied by Barrie Folk on Fri May 03, 2012  6:20 PM ------      Message from: Valera Castle C      Created: Thu May 02, 2012  3:22 PM       Echocardiogram shows severe left ventricular hypertrophy with a stable aortic valve. His blood pressure must be under poor control. Note that I decreased his Lopressor at the last visit because of bradycardia. If not allergic, and lisinopril 10 mg a day. Check blood pressure at rest on regular basis. Goal is less than 140 over less than 90. He'll need a metabolic panel to check his kidney function and potassium and a week to 10 days. We need to check his blood pressure that day as well and increase lisinopril if still high.

## 2012-05-09 ENCOUNTER — Ambulatory Visit (INDEPENDENT_AMBULATORY_CARE_PROVIDER_SITE_OTHER): Payer: Medicare Other | Admitting: *Deleted

## 2012-05-09 DIAGNOSIS — Z23 Encounter for immunization: Secondary | ICD-10-CM

## 2012-05-13 ENCOUNTER — Ambulatory Visit (INDEPENDENT_AMBULATORY_CARE_PROVIDER_SITE_OTHER): Payer: Medicare Other | Admitting: *Deleted

## 2012-05-13 ENCOUNTER — Other Ambulatory Visit (INDEPENDENT_AMBULATORY_CARE_PROVIDER_SITE_OTHER): Payer: Medicare Other

## 2012-05-13 VITALS — BP 146/62 | HR 60 | Ht 66.0 in | Wt 155.0 lb

## 2012-05-13 DIAGNOSIS — I359 Nonrheumatic aortic valve disorder, unspecified: Secondary | ICD-10-CM

## 2012-05-13 DIAGNOSIS — I10 Essential (primary) hypertension: Secondary | ICD-10-CM

## 2012-05-13 DIAGNOSIS — I2510 Atherosclerotic heart disease of native coronary artery without angina pectoris: Secondary | ICD-10-CM

## 2012-05-13 LAB — BASIC METABOLIC PANEL
BUN: 25 mg/dL — ABNORMAL HIGH (ref 6–23)
CO2: 25 mEq/L (ref 19–32)
Chloride: 109 mEq/L (ref 96–112)
Glucose, Bld: 91 mg/dL (ref 70–99)
Potassium: 4.5 mEq/L (ref 3.5–5.1)

## 2012-05-13 NOTE — Progress Notes (Signed)
Seen today for nurse/ bp check. Pt feeling well, no problem with start of new med lisinopril 10 mg daily. Pt brought bp/p readings, high was 195/74 at start of med down to yesterdays 133/47, pulse remains near 59. Pt feels like he is even sleeping better and happy with new medication. I will forward note to DR Wall and nurse.

## 2012-06-06 ENCOUNTER — Telehealth: Payer: Self-pay | Admitting: *Deleted

## 2012-06-06 NOTE — Telephone Encounter (Signed)
Wife calls today b/c pt blood pressure seems to be going up more in the late afternoon.  States pt is feeling fine & has been following low sodium diet. Just concerned b/c the systolic number is rising. I offered appt with Herma Carson PA on 06/11/12 & pt accepted.  BP 167/ ?    (06/03/12) BP: 155/46 HR 50's ( 05/1912) BP: 179/54  HR 52 ( 06/05/12)  Pt is taking all of his medications in the early am. Mylo Red RN

## 2012-06-11 ENCOUNTER — Ambulatory Visit (INDEPENDENT_AMBULATORY_CARE_PROVIDER_SITE_OTHER): Payer: Medicare Other | Admitting: Physician Assistant

## 2012-06-11 ENCOUNTER — Encounter: Payer: Self-pay | Admitting: Physician Assistant

## 2012-06-11 ENCOUNTER — Ambulatory Visit: Payer: Medicare Other | Admitting: Physician Assistant

## 2012-06-11 VITALS — BP 142/56 | HR 58 | Ht 66.0 in | Wt 158.0 lb

## 2012-06-11 DIAGNOSIS — R0600 Dyspnea, unspecified: Secondary | ICD-10-CM

## 2012-06-11 DIAGNOSIS — I2510 Atherosclerotic heart disease of native coronary artery without angina pectoris: Secondary | ICD-10-CM

## 2012-06-11 DIAGNOSIS — I10 Essential (primary) hypertension: Secondary | ICD-10-CM

## 2012-06-11 DIAGNOSIS — I6529 Occlusion and stenosis of unspecified carotid artery: Secondary | ICD-10-CM

## 2012-06-11 DIAGNOSIS — I359 Nonrheumatic aortic valve disorder, unspecified: Secondary | ICD-10-CM

## 2012-06-11 DIAGNOSIS — R0609 Other forms of dyspnea: Secondary | ICD-10-CM

## 2012-06-11 DIAGNOSIS — R0989 Other specified symptoms and signs involving the circulatory and respiratory systems: Secondary | ICD-10-CM

## 2012-06-11 MED ORDER — LISINOPRIL 20 MG PO TABS: 20.0000 mg | ORAL_TABLET | Freq: Every day | ORAL | Status: DC

## 2012-06-11 NOTE — Assessment & Plan Note (Addendum)
Patient's blood pressure is low on the high side in the office today.He has severe LVH on recent 2Decho. I will increase his lisinopril to 20 mg daily. He will bring his blood pressure cuff back in to compare to our calibration. He may need a new cuff at home. Continue 2 g sodium diet. Follow up with Dr. Juanito Doom.

## 2012-06-11 NOTE — Assessment & Plan Note (Signed)
Recent carotid Dopplers on 04/30/12 showed 60-79% bilateral ICA. Follow-up in one year.

## 2012-06-11 NOTE — Progress Notes (Signed)
HPI:  This is an 78 year old male patient Dr. Evlyn Kanner who has a history of hypertension. Recent 2-D echo showed a pattern of severe LVH. Lisinopril was added to his medications and his Lopressor was decreased because of dyspnea on exertion. The patient's blood pressure cuff at home shows his blood pressure running 100 7581 systolic. When he comes here it is much lower. He also states his breathing has improved since decreasing the Lopressor. He does the wife 3 days a week and to work out more on the machine since he can get his heart rate up a little higher to 80 beats per minute. He is very careful about his sodium intake.  Allergies:  -- Sulfonamide Derivatives   Current Outpatient Prescriptions on File Prior to Visit: aspirin 325 MG EC tablet, Take 325 mg by mouth daily.  , Disp: , Rfl:  calcium carbonate (OS-CAL) 600 MG TABS, Take 600 mg by mouth daily.  , Disp: , Rfl:  doxazosin (CARDURA) 4 MG tablet, Take 2 mg by mouth 2 (two) times daily. , Disp: , Rfl:  fish oil-omega-3 fatty acids 1000 MG capsule, Take 1 g by mouth 2 (two) times daily. , Disp: , Rfl:  lisinopril (PRINIVIL,ZESTRIL) 10 MG tablet, Take 1 tablet (10 mg total) by mouth daily., Disp: 30 tablet, Rfl: 6 metoprolol (LOPRESSOR) 50 MG tablet, Take 0.5 tablets (25 mg total) by mouth 2 (two) times daily., Disp: 180 tablet, Rfl: 3 multivitamin (THERAGRAN) tablet, Take 1 tablet by mouth daily.  , Disp: , Rfl:  nitroGLYCERIN (NITROSTAT) 0.4 MG SL tablet, Place 1 tablet (0.4 mg total) under the tongue every 5 (five) minutes as needed for chest pain., Disp: 25 tablet, Rfl: 6 vitamin C (ASCORBIC ACID) 500 MG tablet, Take 500 mg by mouth 2 (two) times daily.  , Disp: , Rfl:  VYTORIN 10-40 MG per tablet, TAKE 1 TABLET BY MOUTH AT BEDTIME., Disp: 90 tablet, Rfl: 2 zolpidem (AMBIEN) 10 MG tablet, Take 10 mg by mouth at bedtime as needed., Disp: , Rfl:     Past Medical History:   Aortic valve disorders                                      Postsurgical aortocoronary bypass status                     Old myocardial infarction                                    Peripheral vascular disease, unspecified                     Coronary atherosclerosis of unspecified type o*              Occlusion and stenosis of carotid artery witho*              Other and unspecified hyperlipidemia                         Cerebrovascular disease, unspecified                         Atherosclerosis of renal artery  Postsurgical percutaneous transluminal coronar*              Scarlet fever                                                Malignant neoplasm of bladder, part unspecified              BPH (benign prostatic hypertrophy)                           Unspecified sleep apnea                                      History of cystoscopy                                          Comment:w/ bladder biopsy   Melanoma in situ of back                        2012           Comment:area of left scapula  Past Surgical History:   CYSTOSCOPY                                                     Comment:w/ bladder biopsy   Percutaneous transluminal coronary angioplasty               CORONARY ARTERY BYPASS GRAFT                                 redo median sternotomy                                         Comment:extracorporeal circulation, aortic valve               replacement using a 23-mm Mitroflow aortic               pericardial heart valve.  Surgeon- [name], MD 04-18-10   MELANOMA EXCISION                               2012           Comment:left scapular  area - clean margins.   Review of patient's family history indicates:   Hypertension                   Mother                   Diabetes                       Mother  Hyperlipidemia                 Mother                   Coronary artery disease        Mother                   Heart disease                  Mother                      Comment: CAD, cardiomyopathy   Coronary artery disease        Father                   Sudden death                   Father                     Comment: 1 wk post MI   Heart attack                   Father                   Heart disease                  Father                     Comment: CAD/MI-sudden death   Cancer                         Other                      Comment: prostate   Birth defects                  Neg Hx                   Social History   Marital Status: Married             Spouse Name:                      Years of Education: 17              Number of children: 3           Occupational History Occupation          Location manager                                 retired                                   Social History Main Topics   Smoking Status: Never Smoker                     Smokeless Status: Never Used                       Alcohol Use: No  Drug Use: No             Sexual Activity: Not Currently      Other Topics            Concern   None on file  Social History Narrative   HSG, [employer], did some graduate work. Married '52. 1 son - '61; 2 daughters -'60, '63: grandchildren 7; great-grands 6. 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 Active gardner, Licensed conveyancer. good marriage.    ROS:see history of present illness otherwise negative   PHYSICAL EXAM: Well-nournished, in no acute distress. Neck: No JVD, HJR, Bruit, or thyroid enlargement  Lungs: No tachypnea, clear without wheezing, rales, or rhonchi  Cardiovascular: RRR, PMI not displaced, heart sounds normal, no murmurs, gallops, bruit, thrill, or heave.  Abdomen: BS normal. Soft without organomegaly, masses, lesions or tenderness.  Extremities: without cyanosis, clubbing or edema. Good distal pulses bilateral  SKin: Warm, no lesions or rashes   Musculoskeletal: No deformities  Neuro: no focal signs  BP 142/56  Pulse 58   Ht 5\' 6"  (1.676 m)  Wt 158 lb (71.668 kg)  BMI 25.50 kg/m2  SpO2 98%   2Decho:04/30/12: Study Conclusions  - Left ventricle: The cavity size was normal. Wall thickness   was increased in a pattern of severe LVH. Systolic   function was normal. The estimated ejection fraction was   in the range of 55% to 60%. - Aortic valve: Tissue AVR appears normal with mildly   elevated gradients and no perivalvular leaks - Mitral valve: Moderate regurgitation. - Left atrium: The atrium was moderately dilated. - Atrial septum: No defect or patent foramen ovale was   identified.

## 2012-06-11 NOTE — Patient Instructions (Addendum)
Your physician recommends that you schedule a follow-up appointment in: 1 month with Dr Daleen Squibb Your physician has recommended you make the following change in your medication: INCREASE Lisinopril to 20 mg daily

## 2012-06-11 NOTE — Assessment & Plan Note (Signed)
Patient's dyspnea on exertion improved with decreasing his beta blocker. He can exercise more easily and it is heart rate up to 80.

## 2012-07-24 ENCOUNTER — Ambulatory Visit (INDEPENDENT_AMBULATORY_CARE_PROVIDER_SITE_OTHER): Payer: Medicare Other | Admitting: Cardiology

## 2012-07-24 ENCOUNTER — Encounter: Payer: Self-pay | Admitting: Cardiology

## 2012-07-24 VITALS — BP 100/40 | HR 60 | Ht 66.0 in | Wt 154.0 lb

## 2012-07-24 DIAGNOSIS — I10 Essential (primary) hypertension: Secondary | ICD-10-CM

## 2012-07-24 NOTE — Patient Instructions (Addendum)
Your physician wants you to follow-up in: 1 year with Dr. Wall. You will receive a reminder letter in the mail two months in advance. If you don't receive a letter, please call our office to schedule the follow-up appointment.  

## 2012-07-24 NOTE — Progress Notes (Signed)
HPI [name] returns today for evaluation and management of his history of severe left ventricular hypertrophy by recent echocardiography and increased dyspnea on exertion. He has a history of aortic stenosis status post prosthetic aortic valve replacement which is stable by recent echo. He has normal left ventricular systolic function.  I decreased his beta blocker which is helped his shortness of breath and increased his ability to exercise. He remains very active. We also have titrated his lisinopril to 20 mg a day with good blood pressure control.  He denies any dizziness except when he bends over and stands up too quickly. He denies any chest pain, palpitations, presyncope or syncope.  Past Medical History  Diagnosis Date  . Aortic valve disorders   . Postsurgical aortocoronary bypass status   . Old myocardial infarction   . Peripheral vascular disease, unspecified   . Coronary atherosclerosis of unspecified type of vessel, native or graft   . Occlusion and stenosis of carotid artery without mention of cerebral infarction   . Other and unspecified hyperlipidemia   . Cerebrovascular disease, unspecified   . Atherosclerosis of renal artery   . Postsurgical percutaneous transluminal coronary angioplasty status   . Scarlet fever   . Malignant neoplasm of bladder, part unspecified   . BPH (benign prostatic hypertrophy)   . Unspecified sleep apnea   . History of cystoscopy     w/ bladder biopsy  . Melanoma in situ of back 2012    area of left scapula    Current Outpatient Prescriptions  Medication Sig Dispense Refill  . aspirin 325 MG EC tablet Take 325 mg by mouth daily.        Marland Kitchen doxazosin (CARDURA) 4 MG tablet Take 2 mg by mouth 2 (two) times daily.       . fish oil-omega-3 fatty acids 1000 MG capsule Take 1 g by mouth 2 (two) times daily.       Marland Kitchen lisinopril (PRINIVIL,ZESTRIL) 20 MG tablet Take 1 tablet (20 mg total) by mouth daily.  30 tablet  6  . metoprolol (LOPRESSOR) 50 MG  tablet Take 0.5 tablets (25 mg total) by mouth 2 (two) times daily.  180 tablet  3  . multivitamin (THERAGRAN) tablet Take 1 tablet by mouth daily.        . nitroGLYCERIN (NITROSTAT) 0.4 MG SL tablet Place 1 tablet (0.4 mg total) under the tongue every 5 (five) minutes as needed for chest pain.  25 tablet  6  . vitamin C (ASCORBIC ACID) 500 MG tablet Take 500 mg by mouth 2 (two) times daily.        Marland Kitchen VYTORIN 10-40 MG per tablet TAKE 1 TABLET BY MOUTH AT BEDTIME.  90 tablet  2  . zolpidem (AMBIEN) 10 MG tablet Take 10 mg by mouth at bedtime as needed.        Allergies  Allergen Reactions  . Sulfonamide Derivatives     Family History  Problem Relation Age of Onset  . Hypertension Mother   . Diabetes Mother   . Hyperlipidemia Mother   . Coronary artery disease Mother   . Heart disease Mother     CAD, cardiomyopathy  . Coronary artery disease Father   . Sudden death Father     1 wk post MI  . Heart attack Father   . Heart disease Father     CAD/MI-sudden death  . Cancer Other     prostate  . Birth defects Neg Hx  History   Social History  . Marital Status: Married    Spouse Name: N/A    Number of Children: 3  . Years of Education: 17   Occupational History  . Art gallery manager   . retired    Social History Main Topics  . Smoking status: Never Smoker   . Smokeless tobacco: Never Used  . Alcohol Use: No  . Drug Use: No  . Sexually Active: Not Currently   Other Topics Concern  . Not on file   Social History Narrative   HSG, [employer], did some graduate work. Married '52. 1 son - '61; 2 daughters -'60, '63: grandchildren 7; great-grands 6. 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 Active gardner, Licensed conveyancer. good marriage.    ROS ALL NEGATIVE EXCEPT THOSE NOTED IN HPI  PE  General Appearance: well developed, well nourished in no acute distress HEENT: symmetrical face, PERRLA, good dentition  Neck: no JVD, thyromegaly, or adenopathy, trachea  midline Chest: symmetric without deformity Cardiac: PMI non-displaced, RRR, normal S1, S2, no gallop, soft systolic murmur, no diastolic component Lung: clear to ausculation and percussion Vascular: all pulses full without bruits  Abdominal: nondistended, nontender, good bowel sounds, no HSM, no bruits Extremities: no cyanosis, clubbing or edema, no sign of DVT, no varicosities  Skin: normal color, no rashes Neuro: alert and oriented x 3, non-focal Pysch: normal affect  EKG  BMET    Component Value Date/Time   NA 140 05/13/2012 1108   K 4.5 05/13/2012 1108   CL 109 05/13/2012 1108   CO2 25 05/13/2012 1108   GLUCOSE 91 05/13/2012 1108   BUN 25* 05/13/2012 1108   CREATININE 1.5 05/13/2012 1108   CALCIUM 8.6 05/13/2012 1108   GFRNONAA 43* 04/22/2010 0445   GFRAA  Value: 53        The eGFR has been calculated using the MDRD equation. This calculation has not been validated in all clinical situations. eGFR's persistently <60 mL/min signify possible Chronic Kidney Disease.* 04/22/2010 0445    Lipid Panel     Component Value Date/Time   CHOL 112 02/08/2012 1548   TRIG 120.0 02/08/2012 1548   HDL 45.00 02/08/2012 1548   CHOLHDL 2 02/08/2012 1548   VLDL 24.0 02/08/2012 1548   LDLCALC 43 02/08/2012 1548    CBC    Component Value Date/Time   WBC 5.0 01/09/2011 1138   RBC 4.56 01/09/2011 1138   HGB 13.3 01/09/2011 1138   HCT 39.3 01/09/2011 1138   PLT 125.0* 01/09/2011 1138   MCV 86.2 01/09/2011 1138   MCH 29.4 04/22/2010 0445   MCHC 34.0 01/09/2011 1138   RDW 16.8* 01/09/2011 1138   LYMPHSABS 1.4 01/09/2011 1138   MONOABS 0.6 01/09/2011 1138   EOSABS 0.1 01/09/2011 1138   BASOSABS 0.0 01/09/2011 1138

## 2012-07-24 NOTE — Assessment & Plan Note (Signed)
Much improved on current regimen with increased exercise tolerance and less shortness of breath. We'll continue since he has severe LVH on his last echocardiogram. I'll see him back in a year.

## 2012-07-27 ENCOUNTER — Ambulatory Visit (INDEPENDENT_AMBULATORY_CARE_PROVIDER_SITE_OTHER): Payer: Medicare Other | Admitting: Internal Medicine

## 2012-07-27 ENCOUNTER — Encounter: Payer: Self-pay | Admitting: Internal Medicine

## 2012-07-27 VITALS — BP 164/62 | HR 61 | Temp 101.4°F | Wt 153.0 lb

## 2012-07-27 DIAGNOSIS — N410 Acute prostatitis: Secondary | ICD-10-CM

## 2012-07-27 MED ORDER — CIPROFLOXACIN HCL 500 MG PO TABS: 500.0000 mg | ORAL_TABLET | Freq: Two times a day (BID) | ORAL | Status: DC

## 2012-07-27 NOTE — Progress Notes (Signed)
Subjective:    Patient ID: [name], male    DOB: 1929/05/04, 79 y.o.   MRN: [id number]  HPI Mr. Candela presents with a 7-10 day h/o fever to 101, chills, myalgias, urinary frequency, mild pain in the perineum, slowed stream, mild dysuria. No lower quadrant abdominal pain. No N/V/D, anorexic.  Past Medical History  Diagnosis Date  . Aortic valve disorders   . Postsurgical aortocoronary bypass status   . Old myocardial infarction   . Peripheral vascular disease, unspecified   . Coronary atherosclerosis of unspecified type of vessel, native or graft   . Occlusion and stenosis of carotid artery without mention of cerebral infarction   . Other and unspecified hyperlipidemia   . Cerebrovascular disease, unspecified   . Atherosclerosis of renal artery   . Postsurgical percutaneous transluminal coronary angioplasty status   . Scarlet fever   . Malignant neoplasm of bladder, part unspecified   . BPH (benign prostatic hypertrophy)   . Unspecified sleep apnea   . History of cystoscopy     w/ bladder biopsy  . Melanoma in situ of back 2012    area of left scapula   Past Surgical History  Procedure Date  . Cystoscopy     w/ bladder biopsy  . Percutaneous transluminal coronary angioplasty   . Coronary artery bypass graft   . Redo median sternotomy     extracorporeal circulation, aortic valve replacement using a 23-mm Mitroflow aortic pericardial heart valve.  Surgeon- [name], MD 04-18-10  . Melanoma excision 2012    left scapular  area - clean margins.    Family History  Problem Relation Age of Onset  . Hypertension Mother   . Diabetes Mother   . Hyperlipidemia Mother   . Coronary artery disease Mother   . Heart disease Mother     CAD, cardiomyopathy  . Coronary artery disease Father   . Sudden death Father     1 wk post MI  . Heart attack Father   . Heart disease Father     CAD/MI-sudden death  . Cancer Other     prostate  . Birth defects Neg Hx    History    Social History  . Marital Status: Married    Spouse Name: N/A    Number of Children: 3  . Years of Education: 17   Occupational History  . Art gallery manager   . retired    Social History Main Topics  . Smoking status: Never Smoker   . Smokeless tobacco: Never Used  . Alcohol Use: No  . Drug Use: No  . Sexually Active: Not Currently   Other Topics Concern  . Not on file   Social History Narrative   HSG, [employer], did some graduate work. Married '52. 1 son - '61; 2 daughters -'63, '63: grandchildren 7; great-grands 6. 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 Active gardner, Licensed conveyancer. good marriage.    Current Outpatient Prescriptions on File Prior to Visit  Medication Sig Dispense Refill  . aspirin 325 MG EC tablet Take 325 mg by mouth daily.        Marland Kitchen doxazosin (CARDURA) 4 MG tablet Take 2 mg by mouth 2 (two) times daily.       . fish oil-omega-3 fatty acids 1000 MG capsule Take 1 g by mouth 2 (two) times daily.       Marland Kitchen lisinopril (PRINIVIL,ZESTRIL) 20 MG tablet Take 1 tablet (20 mg total) by mouth daily.  30 tablet  6  .  metoprolol (LOPRESSOR) 50 MG tablet Take 0.5 tablets (25 mg total) by mouth 2 (two) times daily.  180 tablet  3  . multivitamin (THERAGRAN) tablet Take 1 tablet by mouth daily.        . nitroGLYCERIN (NITROSTAT) 0.4 MG SL tablet Place 1 tablet (0.4 mg total) under the tongue every 5 (five) minutes as needed for chest pain.  25 tablet  6  . vitamin C (ASCORBIC ACID) 500 MG tablet Take 500 mg by mouth 2 (two) times daily.        Marland Kitchen VYTORIN 10-40 MG per tablet TAKE 1 TABLET BY MOUTH AT BEDTIME.  90 tablet  2  . zolpidem (AMBIEN) 10 MG tablet Take 10 mg by mouth at bedtime as needed.          Review of Systems System review is negative for any constitutional, cardiac, pulmonary, GI or neuro symptoms or complaints other than as described in the HPI.     Objective:   Physical Exam Filed Vitals:   07/27/12 1212  BP: 164/62  Pulse: 61  Temp:  101.4 F (38.6 C)   gen'l- WNWD white man in no distress HEENT- TMs normal, throat clear Neck supple,  Nodes - negative  Cor - II/VI murmur at apex, II/VI murmur best at RSB Pulm - clear to A&P Abdomen - BS + x 4, no guarding or rebound, tender to deep palpation at bilateral lower quadrants Prostate - deferred       Assessment & Plan:  Acute prostatitis - history and exam most consistent with prostatitis. This does not look like influenza  Plan  Ciprofloxacin 500 mg twice a day for 10 days  If symptoms do not completely resolve or if there is a recurrence of symptoms get the refill for another 10 days of treatment  Tylenol 500 or 1,000 mg three times a day for fever  Continue all your routine medications.  Call for problems

## 2012-07-27 NOTE — Patient Instructions (Addendum)
Acute prostatitis - history and exam most consistent with prostatitis. This does not look like influenza  Plan  Ciprofloxacin 500 mg twice a day for 10 days  If symptoms do not completely resolve or if there is a recurrence of symptoms get the refill for another 10 days of treatment  Tylenol 500 or 1,000 mg three times a day for fever  Continue all your routine medications.  Call for problems  Prostatitis The prostate gland is about the size and shape of a walnut. It is located just below your bladder. It produces one of the components of semen, which is made up of sperm and the fluids that help nourish and transport it out from the testicles. Prostatitis is redness, soreness, and swelling (inflammation) of the prostate gland.   There are 3 types of prostatitis:  Acute bacterial prostatitis This is the least common type of prostatitis. It starts quickly and usually leads to a bladder infection. It can occur at any age.   Chronic bacterial prostatitis This is a persistent bacterial infection in the prostate.It usually develops from repeated acute bacterial prostatitis or acute bacterial prostatitis that was not properly treated. It can occur in men of any age but is most common in middle-aged men whose prostate has begun to enlarge.   Chronic prostatitis chronic pelvic pain syndrome This is the most common type of prostatitis. It is inflammation of the prostate gland that is not caused by a bacterial infection. The cause is unknown.  CAUSES The cause of acute and chronic bacterial prostatitis is a bacterial infection. The exact cause of chronic prostatitis and chronic pelvic pain syndrome and asymptomatic inflammatory prostatitis is unknown.   SYMPTOMS   Symptoms can vary depending upon the type of prostatitis that exists. There can also be overlap in symptoms. Possible symptoms for each type of prostatitis are listed below. Acute bacterial prostatitis  Painful urination.   Fever or chills.     Muscle or joint pains.   Low back pain.   Low abdominal pain.   Inability to empty bladder completely.   Sudden urge to urinate.   Frequent urination.   Difficulty starting urine stream.   Weak urine stream.   Discharge from the urethra.   Dribbling after urination.   Rectal pain.   Pain in the testicles, penis, or tip of the penis.   Pain in the space between the anus and scrotum (perineum).   Problems with sexual function.   Painful ejaculation.   Bloody semen.  Chronic bacterial prostatitis  The symptoms are similar to those of acute bacterial prostatitis, but they usually are much less severe. Fever, chills, and muscle and joint pain are not associated with chronic bacterial prostatitis.  Chronic prostatitis chronic pelvic pain syndrome  Symptoms typically include a dull ache in the scrotum and the perineum.  DIAGNOSIS   In order to diagnose prostatitis, your caregiver will ask about your symptoms. If acute or chronic bacterial prostatitis is suspected, a urine sample will be taken and tested (urinalysis). This is to see if there is bacteria in your urine. If the urinalysis result is negative for bacteria, your caregiver may use a finger to feel your prostate (digital rectal exam). This exam helps your caregiver determine if your prostate is swollen and tender. TREATMENT   Treatment for prostatitis depends on the cause. If a bacterial infection is the cause, it can be treated with antibiotic medicine. In cases of chronic bacterial prostatitis, the use of antibiotics for up to  1 month may be necessary. Your caregiver may instruct you to take sitz baths to help relieve pain. A sitz bath is a bath of hot water in which your hips and buttocks are under water. HOME CARE INSTRUCTIONS    Take all medicines as directed by your caregiver.   Take sitz baths as directed by your caregiver.  SEEK MEDICAL CARE IF:    Your symptoms get worse, not better.   You have a fever.   SEEK IMMEDIATE MEDICAL CARE IF:    You have chills.   You feel nauseous or vomit.   You feel lightheaded or faint.   You are unable to urinate.   You have blood or blood clots in your urine.  Document Released: 06/30/2000 Document Revised: 09/25/2011 Document Reviewed: 06/05/2011 [facility] Patient Information 2013 [place], [place].

## 2012-08-28 ENCOUNTER — Ambulatory Visit: Payer: Medicare Other | Admitting: Cardiology

## 2012-09-09 ENCOUNTER — Other Ambulatory Visit: Payer: Self-pay | Admitting: Cardiology

## 2012-09-24 ENCOUNTER — Ambulatory Visit (INDEPENDENT_AMBULATORY_CARE_PROVIDER_SITE_OTHER)
Admission: RE | Admit: 2012-09-24 | Discharge: 2012-09-24 | Disposition: A | Discharge status: Home / Self Care | Payer: Medicare Other | Source: Ambulatory Visit | Attending: Internal Medicine | Admitting: Internal Medicine

## 2012-09-24 ENCOUNTER — Encounter: Payer: Self-pay | Admitting: Internal Medicine

## 2012-09-24 ENCOUNTER — Ambulatory Visit (INDEPENDENT_AMBULATORY_CARE_PROVIDER_SITE_OTHER): Payer: Medicare Other | Admitting: Internal Medicine

## 2012-09-24 VITALS — BP 160/70 | Temp 97.5°F | Wt 155.0 lb

## 2012-09-24 DIAGNOSIS — M79609 Pain in unspecified limb: Secondary | ICD-10-CM

## 2012-09-24 DIAGNOSIS — M5090 Cervical disc disorder, unspecified, unspecified cervical region: Secondary | ICD-10-CM

## 2012-09-24 DIAGNOSIS — M79601 Pain in right arm: Secondary | ICD-10-CM

## 2012-09-24 NOTE — Patient Instructions (Addendum)
Pain in the posterior right arm: normal strength, normal reflexes, normal circulation, normal sensation all indicate there is no major injury or nerve compression. Given the normal joint movement and the above the primary concern is for cervical disk disease a minor nature.  Plan Cervical spine x-rays to look for degenerative joint disease in the neck and by inference any disk disease  It is ok to take ibuprofen 200 mg tabs using up to 3 tablets at a time 3 times a day. Watch for gastric irritation, change in stool color  There is no evidence that you will need any major intervention   For allergy symptoms - ok to try OTC loratadine, a non-sedating anti histamine.

## 2012-09-25 DIAGNOSIS — M5090 Cervical disc disorder, unspecified, unspecified cervical region: Secondary | ICD-10-CM | POA: Insufficient documentation

## 2012-09-25 NOTE — Progress Notes (Signed)
Subjective:    Patient ID: [name], male    DOB: 1929-04-26, 79 y.o.   MRN: [id number]  HPI Mr. Northup presents with a 6 week h/o intermittent, persistent pain in the right posterior proximal UE. 3 weeks after the pain started he stopped his usual workout routine but the pain has continued. It is worse when he lifts his arm over his head. He denies any weakness, paresthesia. He denies any injury. There is no radiation of pain to the hand, shoulder, neck or chest. He has had no swelling of the UE. He denies any neck pain or discomfort.  PMH, FamHx and SocHx reviewed for any changes and relevance.  Current Outpatient Prescriptions on File Prior to Visit  Medication Sig Dispense Refill  . aspirin 325 MG EC tablet Take 325 mg by mouth daily.        Marland Kitchen doxazosin (CARDURA) 4 MG tablet Take 2 mg by mouth 2 (two) times daily.       . fish oil-omega-3 fatty acids 1000 MG capsule Take 1 g by mouth 2 (two) times daily.       Marland Kitchen lisinopril (PRINIVIL,ZESTRIL) 20 MG tablet Take 1 tablet (20 mg total) by mouth daily.  30 tablet  6  . metoprolol (LOPRESSOR) 50 MG tablet Take 0.5 tablets (25 mg total) by mouth 2 (two) times daily.  180 tablet  3  . multivitamin (THERAGRAN) tablet Take 1 tablet by mouth daily.        . nitroGLYCERIN (NITROSTAT) 0.4 MG SL tablet Place 1 tablet (0.4 mg total) under the tongue every 5 (five) minutes as needed for chest pain.  25 tablet  6  . vitamin C (ASCORBIC ACID) 500 MG tablet Take 500 mg by mouth 2 (two) times daily.        Marland Kitchen VYTORIN 10-40 MG per tablet TAKE 1 TABLET BY MOUTH AT BEDTIME.  90 tablet  2  . zolpidem (AMBIEN) 10 MG tablet Take 10 mg by mouth at bedtime as needed.       No current facility-administered medications on file prior to visit.      Review of Systems System review is negative for any constitutional, cardiac, pulmonary, GI or neuro symptoms or complaints other than as described in the HPI.     Objective:   Physical Exam Filed Vitals:   09/24/12 1638  BP: 160/70  Temp: 97.5 F (36.4 C)   Wt Readings from Last 3 Encounters:  09/24/12 155 lb (70.308 kg)  07/27/12 153 lb (69.4 kg)  07/24/12 154 lb (69.854 kg)   Gen'l- WNWD white man in no distress HEENT - C&S clear Neck - supple with full ROM Cor- 2+ radial pulse, good capillary refill right hand Pulm - normal respirations MSK - normal ROM hand, elbow, shoulder. Minor tenderness to palpation of right UE Neuro - A&O x 3, normal right UE strength and grip; normal DTRs biceps, triceps and radial tendons; normal sensation to light touch.  c-spine series: CERVICAL SPINE - COMPLETE 4+ VIEW  Comparison: None.  Findings: The lateral view images through the mid C7 level.  Prevertebral soft tissues are within normal limits. Multilevel  spondylosis. This is moderate at C5-C6 and C6-C7, with loss of  intervertebral disc heights and endplate osteophyte formation.  Prominent anterior osteophytes at C3-C4.  Right neural foraminal narrowing at C3-C4 and C5-C6 secondary to  uncovertebral joint hypertrophy. Left-sided neural foraminal  narrowing at C5-C6 and C6-C7 also due to uncovertebral joint  hypertrophy.  Median  sternotomy changes. Lateral masses symmetric. Odontoid  process intact. Cervical thoracic junction not evaluated.  IMPRESSION:  Mid and lower cervical spondylosis, with areas of bilateral neural  foraminal narrowing.  Exclusion of the cervicothoracic junction from the lateral view.        Assessment & Plan:  Pain in the posterior right arm: normal strength, normal reflexes, normal circulation, normal sensation all indicate there is no major injury or nerve compression. Given the normal joint movement and the above the primary concern is for cervical disk disease a minor nature.  Plan Cervical spine x-rays to look for degenerative joint disease in the neck and by inference any disk disease  It is ok to take ibuprofen 200 mg tabs using up to 3 tablets at a time 3  times a day. Watch for gastric irritation, change in stool color  There is no evidence that you will need any major intervention

## 2012-09-25 NOTE — Assessment & Plan Note (Signed)
Pain in the posterior right proximal UE - no shoulder pathology, no radiculopathy. X-ray reveals DDD/DJD.  Plan ROM exercise  NSAIDs - routine precautions given

## 2012-10-02 ENCOUNTER — Ambulatory Visit: Payer: Medicare Other | Admitting: Internal Medicine

## 2012-10-14 ENCOUNTER — Other Ambulatory Visit: Payer: Self-pay | Admitting: Physician Assistant

## 2012-10-16 ENCOUNTER — Other Ambulatory Visit: Payer: Self-pay | Admitting: Physician Assistant

## 2012-12-24 ENCOUNTER — Other Ambulatory Visit: Payer: Self-pay | Admitting: Internal Medicine

## 2012-12-25 ENCOUNTER — Other Ambulatory Visit: Payer: Self-pay | Admitting: Internal Medicine

## 2012-12-25 NOTE — Telephone Encounter (Signed)
Zolpidem called to pharmacy  

## 2012-12-31 ENCOUNTER — Other Ambulatory Visit: Payer: Self-pay | Admitting: Dermatology

## 2013-01-20 ENCOUNTER — Telehealth: Payer: Self-pay | Admitting: Cardiology

## 2013-01-20 NOTE — Telephone Encounter (Signed)
New Problem:    Patient's wife called in wanting her husband to be seen and have his BP checked because he has not been up to par lately.  Patient has been experiencing dizziness and SOB lately.  Please call back.

## 2013-01-20 NOTE — Telephone Encounter (Signed)
I spoke with pt and his wife regarding shortness of breath & dizziness. Pt becomes dizzy/lightheaded when he bends over early in the am to pull weeds.  States his shortness of breath seems to be getting worse even though his metoprolol dose was reduced at last ov with Dr. Daleen Squibb in January  Denies any angina, chest pain, denies any edema or swelling. Weight has decreased from 154 to 149-150 Appetite is good.  Pt will come in on 7/10 for a blood pressure check / same day Dr. Daleen Squibb is in the office at 9:00am.  For Dr. Daleen Squibb to review. Mylo Red RN

## 2013-01-20 NOTE — Telephone Encounter (Signed)
Returned call to patient's wife she stated husband has been dizzy and sob for 2 months.Stated it is getting worse.[facility]. Stated he needs to have B/P checked and appointment with Dr.Wall.Nurse appointment scheduled 01/22/13 for B/P check.Message sent to Dr.Wall's nurse for appointment to be scheduled with Dr.Wall.

## 2013-01-23 ENCOUNTER — Ambulatory Visit (INDEPENDENT_AMBULATORY_CARE_PROVIDER_SITE_OTHER): Payer: Medicare Other | Admitting: *Deleted

## 2013-01-23 ENCOUNTER — Encounter: Payer: Self-pay | Admitting: *Deleted

## 2013-01-23 VITALS — BP 118/53 | HR 60 | Ht 66.0 in | Wt 150.8 lb

## 2013-01-23 DIAGNOSIS — R0600 Dyspnea, unspecified: Secondary | ICD-10-CM

## 2013-01-23 DIAGNOSIS — R0609 Other forms of dyspnea: Secondary | ICD-10-CM

## 2013-01-23 MED ORDER — DOXAZOSIN MESYLATE 4 MG PO TABS: 2.0000 mg | ORAL_TABLET | Freq: Every day | ORAL | Status: DC

## 2013-01-23 MED ORDER — METOPROLOL TARTRATE 50 MG PO TABS: 25.0000 mg | ORAL_TABLET | Freq: Two times a day (BID) | ORAL | Status: DC

## 2013-01-23 NOTE — Progress Notes (Signed)
Pt comes in today for scheduled blood pressure check. States he has been experiencing increased episodes of dizziness while picking weeds as well as increased shortness of breath with activities such as walking on the treadmill with an incline.  Dr. Daleen Squibb has reviewed orthostatic blood pressures & recommends: Decrease Cardura to 2mg  & take only at bedtime Decrease Metoprolol tartrate to 25mg  twice a day Stay well hydrated  Mylo Red RN

## 2013-01-23 NOTE — Patient Instructions (Addendum)
Decrease Metoprolol tartrate to 25 mg twice a day  Decrease Cardura to 2 mg & take at bedtime each night.  Your physician has requested that you regularly monitor and record your blood pressure readings at home.  Please use the same machine at the same time of day to check your readings and record them to bring to your follow-up visit.

## 2013-03-10 ENCOUNTER — Other Ambulatory Visit (INDEPENDENT_AMBULATORY_CARE_PROVIDER_SITE_OTHER): Payer: Medicare Other

## 2013-03-10 ENCOUNTER — Encounter: Payer: Self-pay | Admitting: Internal Medicine

## 2013-03-10 ENCOUNTER — Ambulatory Visit (INDEPENDENT_AMBULATORY_CARE_PROVIDER_SITE_OTHER): Payer: Medicare Other | Admitting: Internal Medicine

## 2013-03-10 VITALS — BP 116/68 | HR 51 | Temp 97.2°F | Ht 66.0 in | Wt 154.4 lb

## 2013-03-10 DIAGNOSIS — Z0000 Encounter for general adult medical examination without abnormal findings: Secondary | ICD-10-CM

## 2013-03-10 DIAGNOSIS — I359 Nonrheumatic aortic valve disorder, unspecified: Secondary | ICD-10-CM

## 2013-03-10 DIAGNOSIS — I10 Essential (primary) hypertension: Secondary | ICD-10-CM

## 2013-03-10 DIAGNOSIS — E039 Hypothyroidism, unspecified: Secondary | ICD-10-CM

## 2013-03-10 DIAGNOSIS — E785 Hyperlipidemia, unspecified: Secondary | ICD-10-CM

## 2013-03-10 DIAGNOSIS — I2510 Atherosclerotic heart disease of native coronary artery without angina pectoris: Secondary | ICD-10-CM

## 2013-03-10 DIAGNOSIS — C679 Malignant neoplasm of bladder, unspecified: Secondary | ICD-10-CM

## 2013-03-10 DIAGNOSIS — I6529 Occlusion and stenosis of unspecified carotid artery: Secondary | ICD-10-CM

## 2013-03-10 LAB — HEMOGLOBIN AND HEMATOCRIT, BLOOD: HCT: 37.3 % — ABNORMAL LOW (ref 39.0–52.0)

## 2013-03-10 LAB — LIPID PANEL
Cholesterol: 101 mg/dL (ref 0–200)
LDL Cholesterol: 50 mg/dL (ref 0–99)
Triglycerides: 44 mg/dL (ref 0.0–149.0)

## 2013-03-10 LAB — COMPREHENSIVE METABOLIC PANEL
Albumin: 3.7 g/dL (ref 3.5–5.2)
BUN: 29 mg/dL — ABNORMAL HIGH (ref 6–23)
CO2: 24 mEq/L (ref 19–32)
Calcium: 8.6 mg/dL (ref 8.4–10.5)
GFR: 48.75 mL/min — ABNORMAL LOW (ref >60.00)
Glucose, Bld: 103 mg/dL — ABNORMAL HIGH (ref 70–99)
Potassium: 4.1 mEq/L (ref 3.5–5.1)
Sodium: 140 mEq/L (ref 135–145)
Total Protein: 6.4 g/dL (ref 6.0–8.3)

## 2013-03-10 LAB — HEPATIC FUNCTION PANEL
Alkaline Phosphatase: 43 U/L (ref 39–117)
Bilirubin, Direct: 0.1 mg/dL (ref 0.0–0.3)
Total Bilirubin: 0.5 mg/dL (ref 0.3–1.2)

## 2013-03-10 MED ORDER — ZALEPLON 5 MG PO CAPS: 5.0000 mg | ORAL_CAPSULE | ORAL | Status: DC | PRN

## 2013-03-10 NOTE — Assessment & Plan Note (Signed)
Last urology visit Oct '13 - stable and doing well with PSA 1.98

## 2013-03-10 NOTE — Patient Instructions (Addendum)
Good to see you - you are looking fit.  Plan is for lab today and results will be posted to MyChart.  Due for follow up carotid doppler exam - scheduled for Monday, October 20th - you will be notified as to time at [employer] are doing well overall. For sleep - see below. Avoid extinction behavior - laying in bed awake. Get up, have a cup of chamomille tea with milk, read and return to bed when sleepy. If medication is needed will have you try Sonata 5 mg which has a short half-life and is less likely to cause a hangover.   Sleep is a learned or unlearned behavior. 5 principles of sleep hygiene - 1) regular hour to retire and rise 7days/wk 2) no stimulants - caffeine, chocolat, alcohol, 3) regular exercise  - every afternoon  4) sleep sanctuary - a space that is right light, temperature, sound level, good bed where all you do is sleep. 5) No extinction behaviors, e.g. Laying in bed awake doing anything but sleeping. This means if you have a bad night - no naps, etc    Come back in a year or sooner if you need to.

## 2013-03-10 NOTE — Assessment & Plan Note (Signed)
Lab Results  Component Value Date   TSH 1.90 03/10/2013   Normal range on no medication - not an active problem

## 2013-03-10 NOTE — Assessment & Plan Note (Signed)
Stable with no c/o chest pain, no cardiac symptoms and no limitation in activity.  Plan F/u with cardiology - new cardiologist in light of Dr. Vern Claude transition.

## 2013-03-10 NOTE — Assessment & Plan Note (Signed)
Bruits on exam. Last study reviewed.  Plan Carotid doppler Oct '14.

## 2013-03-10 NOTE — Assessment & Plan Note (Signed)
BP Readings from Last 3 Encounters:  03/10/13 116/68  01/23/13 118/53  09/24/12 160/70   Very good control on present meds and since reduction in cardura no weakness or decreased exercise tolerance. Renal function and lytes normal  Plan Continue present medications

## 2013-03-10 NOTE — Assessment & Plan Note (Signed)
Stable and doing well.   Plan Routine cardiology follow-up.

## 2013-03-10 NOTE — Progress Notes (Signed)
Subjective:    Patient ID: [name], male    DOB: 1928-12-07, 79 y.o.   MRN: [id number]  HPI The patient is here for annual Medicare wellness examination and management of other chronic and acute problems.   In the interval he has had no major illness, surgery or injury. His chief complaint is poor sleep with duration issues. He will get up at night to micturate but then has a hard time falling back asleep.    The risk factors are reflected in the social history.  The roster of all physicians providing medical care to patient - is listed in the Snapshot section of the chart.  Activities of daily living:  The patient is 100% inedpendent in all ADLs: dressing, toileting, feeding as well as independent mobility  Home safety : The patient has smoke detectors in the home. Falls - no falls and home is fall safe. They wear seatbelts.  firearms are present in the home, kept in a safe fashion. There is no violence in the home.   There is no risks for hepatitis, STDs or HIV. There is no history of blood transfusion. They have no travel history to infectious disease endemic areas of the world.  The patient has seen their dentist in the last six month. They have seen their eye doctor in the last year. They admit to hearing difficulty and have not had audiologic testing in the last year.    They do have excessive sun exposure. Discussed the need for sun protection: hats, long sleeves and use of sunscreen if there is significant sun exposure.   Diet: the importance of a healthy diet is discussed. They do have a healthy diet.  The patient has a regular exercise program: cardio and weight , 60 min duration, 3 per week.  The benefits of regular aerobic exercise were discussed.  Depression screen: there are no signs or vegative symptoms of depression- irritability, change in appetite, anhedonia, sadness/tearfullness.  Cognitive assessment: the patient manages all their financial and personal affairs  and is actively engaged.   The following portions of the patient's history were reviewed and updated as appropriate: allergies, current medications, past family history, past medical history,  past surgical history, past social history  and problem list.  Past Medical History  Diagnosis Date  . Aortic valve disorders   . Postsurgical aortocoronary bypass status   . Old myocardial infarction   . Peripheral vascular disease, unspecified   . Coronary atherosclerosis of unspecified type of vessel, native or graft   . Occlusion and stenosis of carotid artery without mention of cerebral infarction   . Other and unspecified hyperlipidemia   . Cerebrovascular disease, unspecified   . Atherosclerosis of renal artery   . Postsurgical percutaneous transluminal coronary angioplasty status   . Scarlet fever   . Malignant neoplasm of bladder, part unspecified   . BPH (benign prostatic hypertrophy)   . Unspecified sleep apnea   . History of cystoscopy     w/ bladder biopsy  . Melanoma in situ of back 2012    area of left scapula   Past Surgical History  Procedure Laterality Date  . Cystoscopy      w/ bladder biopsy  . Percutaneous transluminal coronary angioplasty    . Coronary artery bypass graft    . Redo median sternotomy      extracorporeal circulation, aortic valve replacement using a 23-mm Mitroflow aortic pericardial heart valve.  Surgeon- [name], MD 04-18-10  . Melanoma excision  2012    left scapular  area - clean margins.    Family History  Problem Relation Age of Onset  . Hypertension Mother   . Diabetes Mother   . Hyperlipidemia Mother   . Coronary artery disease Mother   . Heart disease Mother     CAD, cardiomyopathy  . Coronary artery disease Father   . Sudden death Father     1 wk post MI  . Heart attack Father   . Heart disease Father     CAD/MI-sudden death  . Cancer Other     prostate  . Birth defects Neg Hx    History   Social History  . Marital  Status: Married    Spouse Name: N/A    Number of Children: 3  . Years of Education: 17   Occupational History  . Art gallery manager   . retired    Social History Main Topics  . Smoking status: Never Smoker   . Smokeless tobacco: Never Used  . Alcohol Use: Yes     Comment: rare  . Drug Use: No  . Sexual Activity: Not Currently   Other Topics Concern  . Not on file   Social History Narrative   HSG, [employer], did some graduate work. Married '52. 1 son - '61; 2 daughters -'15, '63: grandchildren 7; great-grands 6. 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 Active gardner, Licensed conveyancer. good marriage.    Current Outpatient Prescriptions on File Prior to Visit  Medication Sig Dispense Refill  . aspirin 325 MG EC tablet Take 325 mg by mouth daily.        Marland Kitchen doxazosin (CARDURA) 4 MG tablet Take 0.5 tablets (2 mg total) by mouth at bedtime.      . fish oil-omega-3 fatty acids 1000 MG capsule Take 1 g by mouth 2 (two) times daily.       Marland Kitchen lisinopril (PRINIVIL,ZESTRIL) 20 MG tablet TAKE 1 TABLET (20 MG TOTAL) BY MOUTH DAILY.  90 tablet  5  . metoprolol (LOPRESSOR) 50 MG tablet Take 0.5 tablets (25 mg total) by mouth 2 (two) times daily.      . multivitamin (THERAGRAN) tablet Take 1 tablet by mouth daily.        . vitamin C (ASCORBIC ACID) 500 MG tablet Take 500 mg by mouth 2 (two) times daily.        Marland Kitchen VYTORIN 10-40 MG per tablet TAKE 1 TABLET BY MOUTH AT BEDTIME.  90 tablet  2  . nitroGLYCERIN (NITROSTAT) 0.4 MG SL tablet Place 1 tablet (0.4 mg total) under the tongue every 5 (five) minutes as needed for chest pain.  25 tablet  6   No current facility-administered medications on file prior to visit.    Vision, hearing, body mass index were assessed and reviewed.   During the course of the visit the patient was educated and counseled about appropriate screening and preventive services including : fall prevention , diabetes screening, nutrition counseling, colorectal cancer screening,  and recommended immunizations.    Review of Systems Constitutional:  Negative for fever, chills, activity change and unexpected weight change.  HEENT:  Negative for hearing loss, ear pain, congestion, neck stiffness and postnasal drip. Negative for sore throat or swallowing problems. Negative for dental complaints.   Eyes: Negative for vision loss or change in visual acuity.  Respiratory: Negative for chest tightness and wheezing. Had DOE/SOB in the Spring - had orthostatic hypotension..   Cardiovascular: Negative for chest pain or palpitations. No  decreased exercise tolerance Gastrointestinal: No change in bowel habit. No bloating or gas. No reflux or indigestion Genitourinary: Negative for urgency, frequency, flank pain and difficulty urinating.  Musculoskeletal: Negative for myalgias, back pain, arthralgias and gait problem.  Neurological: Negative for dizziness, tremors, weakness and headaches.  Hematological: Negative for adenopathy.  Psychiatric/Behavioral: Negative for behavioral problems and dysphoric mood.       Objective:   Physical Exam Filed Vitals:   03/10/13 1326  BP: 116/68  Pulse: 51  Temp: 97.2 F (36.2 C)   Wt Readings from Last 3 Encounters:  03/10/13 154 lb 6.4 oz (70.035 kg)  01/23/13 150 lb 12.8 oz (68.402 kg)  09/24/12 155 lb (70.308 kg)   Gen'l: Well nourished well developed male in no acute distress  HEENT: Head: Normocephalic and atraumatic. Right Ear: External ear normal. EAC/TM nl. Left Ear: External ear normal.  EAC/TM nl. Nose: Nose normal. Mouth/Throat: Oropharynx is clear and moist. Dentition - native, in good repair. No buccal or palatal lesions. Posterior pharynx clear. Eyes: Conjunctivae and sclera clear. EOM intact. Pupils are equal, round, and reactive to light. Right eye exhibits no discharge. Left eye exhibits no discharge. Neck: Normal range of motion. Neck supple. No JVD present. No tracheal deviation present. No thyromegaly present.   Cardiovascular: Normal rate, regular rhythm, no gallop, no friction rub, soft systolic murmur best at RSB..      Quiet precordium. 2+ radial and DP pulses . Right carotid bruits c/w prosthetic valve vs Carotid disease. Pulmonary/Chest: Effort normal. No respiratory distress or increased WOB, no wheezes, no rales. No chest wall deformity or CVAT. Abdomen: Soft. Bowel sounds are normal in all quadrants. He exhibits no distension, no tenderness, no rebound or guarding, No heptosplenomegaly  Genitourinary:  deferred Musculoskeletal: Normal range of motion. He exhibits no edema and no tenderness.       Small and large joints without redness, synovial thickening or deformity. Full range of motion preserved about all small, median and large joints.  Lymphadenopathy:    He has no cervical or supraclavicular adenopathy.  Neurological: He is alert and oriented to person, place, and time. CN II-XII intact. DTRs 2+ and symmetrical biceps, radial and patellar tendons. Cerebellar function normal with no tremor, rigidity, normal gait and station.  Skin: Skin is warm and dry. No rash noted. No erythema.  Psychiatric: He has a normal mood and affect. His behavior is normal. Thought content normal.   Recent Results (from the past 2160 hour(s))  HEPATIC FUNCTION PANEL     Status: None   Collection Time    03/10/13  2:30 PM      Result Value Range   Total Bilirubin 0.5  0.3 - 1.2 mg/dL   Bilirubin, Direct 0.1  0.0 - 0.3 mg/dL   Alkaline Phosphatase 43  39 - 117 U/L   AST 25  0 - 37 U/L   ALT 19  0 - 53 U/L   Total Protein 6.4  6.0 - 8.3 g/dL   Albumin 3.7  3.5 - 5.2 g/dL  TSH     Status: None   Collection Time    03/10/13  2:30 PM      Result Value Range   TSH 1.90  0.35 - 5.50 uIU/mL  COMPREHENSIVE METABOLIC PANEL     Status: Abnormal   Collection Time    03/10/13  2:30 PM      Result Value Range   Sodium 140  135 - 145 mEq/L   Potassium 4.1  3.5 - 5.1 mEq/L   Chloride 109  96 - 112 mEq/L   CO2  24  19 - 32 mEq/L   Glucose, Bld 103 (*) 70 - 99 mg/dL   BUN 29 (*) 6 - 23 mg/dL   Creatinine, Ser 1.5  0.4 - 1.5 mg/dL   Total Bilirubin 0.5  0.3 - 1.2 mg/dL   Alkaline Phosphatase 43  39 - 117 U/L   AST 25  0 - 37 U/L   ALT 19  0 - 53 U/L   Total Protein 6.4  6.0 - 8.3 g/dL   Albumin 3.7  3.5 - 5.2 g/dL   Calcium 8.6  8.4 - 96.0 mg/dL   GFR 45.40 (*) >98.11 mL/min  LIPID PANEL     Status: None   Collection Time    03/10/13  2:30 PM      Result Value Range   Cholesterol 101  0 - 200 mg/dL   Comment: ATP III Classification       Desirable:  < 200 mg/dL               Borderline High:  200 - 239 mg/dL          High:  > = 914 mg/dL   Triglycerides 78.2  0.0 - 149.0 mg/dL   Comment: Normal:  <956 mg/dLBorderline High:  150 - 199 mg/dL   HDL 21.30  >86.57 mg/dL   VLDL 8.8  0.0 - 84.6 mg/dL   LDL Cholesterol 50  0 - 99 mg/dL   Total CHOL/HDL Ratio 2     Comment:                Men          Women1/2 Average Risk     3.4          3.3Average Risk          5.0          4.42X Average Risk          9.6          7.13X Average Risk          15.0          11.0                      HEMOGLOBIN AND HEMATOCRIT, BLOOD     Status: Abnormal   Collection Time    03/10/13  2:30 PM      Result Value Range   Hemoglobin 12.6 (*) 13.0 - 17.0 g/dL   HCT 96.2 (*) 95.2 - 84.1 %       Assessment & Plan:

## 2013-03-10 NOTE — Assessment & Plan Note (Signed)
Interval history relatively benign w/o major medical illness, surgery or injury. Physical exam is stable. He is current with colorectal cancer screening and has aged out of prostate cancer screening. Immunizations are up to date.  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 He will return in 6 months for an interval check.

## 2013-03-10 NOTE — Assessment & Plan Note (Signed)
Lab reveals LDL better than goal of less than 80, HDL better than goal of 40+. Liver functions are normal.  Plan Continue present medications.

## 2013-04-22 ENCOUNTER — Ambulatory Visit (INDEPENDENT_AMBULATORY_CARE_PROVIDER_SITE_OTHER): Payer: Medicare Other

## 2013-04-22 DIAGNOSIS — Z23 Encounter for immunization: Secondary | ICD-10-CM

## 2013-05-01 ENCOUNTER — Ambulatory Visit (HOSPITAL_COMMUNITY): Payer: Medicare Other | Attending: Cardiology

## 2013-05-01 DIAGNOSIS — E785 Hyperlipidemia, unspecified: Secondary | ICD-10-CM | POA: Insufficient documentation

## 2013-05-01 DIAGNOSIS — Z87891 Personal history of nicotine dependence: Secondary | ICD-10-CM | POA: Insufficient documentation

## 2013-05-01 DIAGNOSIS — I2510 Atherosclerotic heart disease of native coronary artery without angina pectoris: Secondary | ICD-10-CM | POA: Insufficient documentation

## 2013-05-01 DIAGNOSIS — I6529 Occlusion and stenosis of unspecified carotid artery: Secondary | ICD-10-CM

## 2013-05-01 DIAGNOSIS — Z951 Presence of aortocoronary bypass graft: Secondary | ICD-10-CM | POA: Insufficient documentation

## 2013-05-01 DIAGNOSIS — I658 Occlusion and stenosis of other precerebral arteries: Secondary | ICD-10-CM | POA: Insufficient documentation

## 2013-05-01 DIAGNOSIS — I10 Essential (primary) hypertension: Secondary | ICD-10-CM | POA: Insufficient documentation

## 2013-05-01 DIAGNOSIS — R0989 Other specified symptoms and signs involving the circulatory and respiratory systems: Secondary | ICD-10-CM | POA: Insufficient documentation

## 2013-05-01 DIAGNOSIS — I739 Peripheral vascular disease, unspecified: Secondary | ICD-10-CM | POA: Insufficient documentation

## 2013-05-02 ENCOUNTER — Other Ambulatory Visit: Payer: Self-pay | Admitting: Dermatology

## 2013-08-06 ENCOUNTER — Encounter: Payer: Self-pay | Admitting: Cardiovascular Disease

## 2013-08-06 ENCOUNTER — Ambulatory Visit (INDEPENDENT_AMBULATORY_CARE_PROVIDER_SITE_OTHER): Payer: Medicare Other | Admitting: Cardiovascular Disease

## 2013-08-06 VITALS — BP 150/70 | HR 59 | Ht 66.0 in | Wt 150.8 lb

## 2013-08-06 DIAGNOSIS — I359 Nonrheumatic aortic valve disorder, unspecified: Secondary | ICD-10-CM

## 2013-08-06 MED ORDER — METOPROLOL TARTRATE 50 MG PO TABS: 50.0000 mg | ORAL_TABLET | Freq: Two times a day (BID) | ORAL | Status: DC

## 2013-08-06 NOTE — Progress Notes (Signed)
HPI:  80 year old gentleman presenting for followup evaluation. He has been followed by Dr. wall. The patient has aortic valve disease status post aortic valve replacement. He has been noted to have severe left ventricular hypertrophy.  The patient underwent multivessel CABG in 1995. He then went on to develop severe symptomatic aortic stenosis. He underwent aortic valve replacement in 2011 with a 23 mm mitroflow aortic pericardial heart valve. His last echocardiogram was in October 2013 with the result outlined below.  Last cholesterol panel in August 2014 showed a cholesterol of 101, triglycerides 44, HDL 42, and LDL 50.  The patient reports that his home blood pressures are generally elevated. Most systolic readings are greater than 150 mm mercury. He is limited by shortness of breath with exertion. He cannot walk up hills. He is able to walk on level ground without much limitation. He denies chest pain or pressure, palpitations, orthopnea, or PND. In reviewing past notes, he's had problems with lightheadedness but tells me today that this is improved.  Outpatient Encounter Prescriptions as of 08/06/2013  Medication Sig  . aspirin 325 MG EC tablet Take 325 mg by mouth daily.    . fish oil-omega-3 fatty acids 1000 MG capsule Take 1 g by mouth 2 (two) times daily.   Marland Kitchen lisinopril (PRINIVIL,ZESTRIL) 20 MG tablet TAKE 1 TABLET (20 MG TOTAL) BY MOUTH DAILY.  . metoprolol (LOPRESSOR) 50 MG tablet Take 0.5 tablets (25 mg total) by mouth 2 (two) times daily.  . multivitamin (THERAGRAN) tablet Take 1 tablet by mouth daily.    . tamsulosin (FLOMAX) 0.4 MG CAPS capsule   . vitamin C (ASCORBIC ACID) 500 MG tablet Take 500 mg by mouth 2 (two) times daily.    Marland Kitchen VYTORIN 10-40 MG per tablet TAKE 1 TABLET BY MOUTH AT BEDTIME.  . [DISCONTINUED] zaleplon (SONATA) 5 MG capsule Take 1 capsule (5 mg total) by mouth as needed.  . nitroGLYCERIN (NITROSTAT) 0.4 MG SL tablet Place 1 tablet (0.4 mg total) under the  tongue every 5 (five) minutes as needed for chest pain.  . [DISCONTINUED] doxazosin (CARDURA) 4 MG tablet Take 0.5 tablets (2 mg total) by mouth at bedtime.    Allergies  Allergen Reactions  . Sulfonamide Derivatives     Past Medical History  Diagnosis Date  . Aortic valve disorders   . Postsurgical aortocoronary bypass status   . Old myocardial infarction   . Peripheral vascular disease, unspecified   . Coronary atherosclerosis of unspecified type of vessel, native or graft   . Occlusion and stenosis of carotid artery without mention of cerebral infarction   . Other and unspecified hyperlipidemia   . Cerebrovascular disease, unspecified   . Atherosclerosis of renal artery   . Postsurgical percutaneous transluminal coronary angioplasty status   . Scarlet fever   . Malignant neoplasm of bladder, part unspecified   . BPH (benign prostatic hypertrophy)   . Unspecified sleep apnea   . History of cystoscopy     w/ bladder biopsy  . Melanoma in situ of back 2012    area of left scapula    ROS: Negative except as per HPI  BP 150/70  Pulse 59  Ht 5\' 6"  (1.676 m)  Wt 150 lb 12.8 oz (68.402 kg)  BMI 24.35 kg/m2  PHYSICAL EXAM: Pt is alert and oriented, pleasant gentleman in NAD HEENT: normal Neck: JVP - normal, carotids 2+= with a right carotid bruit Lungs: CTA bilaterally CV: RRR with grade 2/6 systolic ejection murmur  at the left lower sternal border Abd: soft, NT, Positive BS, no hepatomegaly Ext: no C/C/E, distal pulses intact and equal Skin: warm/dry no rash  EKG:  Normal sinus rhythm 59 beats per minute with first degree AV block, right bundle branch block. Rare PVC.  2-D echocardiogram 05/02/2012: Left ventricle: The cavity size was normal. Wall thickness was increased in a pattern of severe LVH. Systolic function was normal. The estimated ejection fraction was in the range of 55% to 60%.  ------------------------------------------------------------ Aortic  valve: Tissue AVR appears normal with mildly elevated gradients and no perivalvular leaks Doppler: VTI ratio of LVOT to aortic valve: 0.36. Indexed valve area: 0.69cm^2/m^2 (VTI). Peak velocity ratio of LVOT to aortic valve: 0.31. Indexed valve area: 0.6cm^2/m^2 (Vmax). Mean gradient: 41mm Hg (S). Peak gradient: 61mm Hg (S).  ------------------------------------------------------------ Aorta: The aorta was normal, not dilated, and non-diseased.  ------------------------------------------------------------ Mitral valve: Mildly thickened leaflets . Doppler: Moderate regurgitation. Peak gradient: 26mm Hg (D).  ------------------------------------------------------------ Left atrium: The atrium was moderately dilated.  ------------------------------------------------------------ Atrial septum: No defect or patent foramen ovale was identified.  ------------------------------------------------------------ Right ventricle: The cavity size was normal. Wall thickness was normal. Systolic function was normal.  ------------------------------------------------------------ Pulmonic valve: Doppler: Trivial regurgitation.  ------------------------------------------------------------ Tricuspid valve: Doppler: Mild regurgitation.  ------------------------------------------------------------ Right atrium: The atrium was normal in size.  ------------------------------------------------------------ Pericardium: The pericardium was normal in appearance.  ------------------------------------------------------------ Post procedure conclusions Ascending Aorta:  - The aorta was normal, not dilated, and non-diseased.  ASSESSMENT AND PLAN: 1. Exertional dyspnea. Possibly related to aortic valve disease as well as hypertensive heart disease with chronic diastolic dysfunction. Will increase metoprolol to 50 mg twice daily. He may benefit from the addition of a diuretic as well. Will follow his blood  pressure response to increasing metoprolol first. Repeat an echocardiogram to assess his aortic valve prosthesis. Will followup in 6 months.  2. Aortic stenosis status post bioprosthetic aortic valve replacement. As above.  3. CAD status post CABG. Previous cardiac catheterization data reviewed with continued patency of his bypass grafts and 2011. No anginal symptoms. Continue medical therapy.  4. Carotid stenosis. Most recent carotid duplex from October 2014 reviewed. He has moderate bilateral ICA stenosis 60-79%. Will repeat a carotid duplex scan in 1 year.  5. Hypertension with suboptimal control. Increase metoprolol. Consider addition of hydrochlorothiazide as next step.  Sherren Mocha 08/06/2013 11:59 AM

## 2013-08-06 NOTE — Patient Instructions (Signed)
Your physician has recommended you make the following change in your medication:  INCREASE Metoprolol to 50 mg twice daily  Your physician has requested that you have an echocardiogram. Echocardiography is a painless test that uses sound waves to create images of your heart. It provides your doctor with information about the size and shape of your heart and how well your heart's chambers and valves are working. This procedure takes approximately one hour. There are no restrictions for this procedure.  Your physician wants you to follow-up in: 6 months with Dr. Burt Knack.  You will receive a reminder letter in the mail two months in advance. If you don't receive a letter, please call our office to schedule the follow-up appointment.

## 2013-08-14 ENCOUNTER — Encounter: Payer: Self-pay | Admitting: Cardiology

## 2013-08-14 ENCOUNTER — Ambulatory Visit (HOSPITAL_COMMUNITY): Payer: Medicare Other | Attending: Cardiovascular Disease | Admitting: Radiology

## 2013-08-14 DIAGNOSIS — T82897A Other specified complication of cardiac prosthetic devices, implants and grafts, initial encounter: Secondary | ICD-10-CM | POA: Insufficient documentation

## 2013-08-14 DIAGNOSIS — R0989 Other specified symptoms and signs involving the circulatory and respiratory systems: Secondary | ICD-10-CM

## 2013-08-14 DIAGNOSIS — Y831 Surgical operation with implant of artificial internal device as the cause of abnormal reaction of the patient, or of later complication, without mention of misadventure at the time of the procedure: Secondary | ICD-10-CM | POA: Insufficient documentation

## 2013-08-14 DIAGNOSIS — I059 Rheumatic mitral valve disease, unspecified: Secondary | ICD-10-CM | POA: Insufficient documentation

## 2013-08-14 DIAGNOSIS — I359 Nonrheumatic aortic valve disorder, unspecified: Secondary | ICD-10-CM

## 2013-08-14 DIAGNOSIS — R0609 Other forms of dyspnea: Secondary | ICD-10-CM | POA: Insufficient documentation

## 2013-08-14 DIAGNOSIS — I10 Essential (primary) hypertension: Secondary | ICD-10-CM | POA: Insufficient documentation

## 2013-08-14 NOTE — Progress Notes (Signed)
Echocardiogram performed.  

## 2013-09-08 ENCOUNTER — Other Ambulatory Visit: Payer: Self-pay | Admitting: Cardiology

## 2013-09-09 ENCOUNTER — Other Ambulatory Visit: Payer: Self-pay | Admitting: Cardiology

## 2013-10-13 ENCOUNTER — Other Ambulatory Visit: Payer: Self-pay | Admitting: Internal Medicine

## 2013-10-13 NOTE — Telephone Encounter (Signed)
Do you mind refilling for couple months or so till patient establishes with new physician?

## 2013-11-06 ENCOUNTER — Telehealth: Payer: Self-pay | Admitting: *Deleted

## 2013-11-06 NOTE — Telephone Encounter (Signed)
Unfortunately, I'm not able to accept any more new patients at this time.  I'm sorry! Thank you!  

## 2013-11-06 NOTE — Telephone Encounter (Signed)
Pt and his wife, Molli Posey are requesting to be transferred to your service for pt care.  Please advise.

## 2013-11-07 NOTE — Telephone Encounter (Signed)
Spoke with pts wife advised of MDs message.

## 2013-11-10 ENCOUNTER — Other Ambulatory Visit: Payer: Self-pay | Admitting: *Deleted

## 2013-11-10 ENCOUNTER — Ambulatory Visit (INDEPENDENT_AMBULATORY_CARE_PROVIDER_SITE_OTHER): Payer: Medicare Other | Admitting: Internal Medicine

## 2013-11-10 ENCOUNTER — Encounter: Payer: Self-pay | Admitting: Internal Medicine

## 2013-11-10 VITALS — BP 138/68 | HR 58 | Temp 97.5°F | Resp 16 | Ht 66.0 in | Wt 150.8 lb

## 2013-11-10 DIAGNOSIS — H6120 Impacted cerumen, unspecified ear: Secondary | ICD-10-CM

## 2013-11-10 DIAGNOSIS — R739 Hyperglycemia, unspecified: Secondary | ICD-10-CM

## 2013-11-10 DIAGNOSIS — E039 Hypothyroidism, unspecified: Secondary | ICD-10-CM

## 2013-11-10 DIAGNOSIS — E785 Hyperlipidemia, unspecified: Secondary | ICD-10-CM

## 2013-11-10 DIAGNOSIS — R252 Cramp and spasm: Secondary | ICD-10-CM

## 2013-11-10 DIAGNOSIS — I10 Essential (primary) hypertension: Secondary | ICD-10-CM

## 2013-11-10 DIAGNOSIS — I2510 Atherosclerotic heart disease of native coronary artery without angina pectoris: Secondary | ICD-10-CM

## 2013-11-10 DIAGNOSIS — R7309 Other abnormal glucose: Secondary | ICD-10-CM

## 2013-11-10 MED ORDER — VITAMIN D 1000 UNITS PO TABS: 1000.0000 [IU] | ORAL_TABLET | Freq: Every day | ORAL | Status: AC

## 2013-11-10 MED ORDER — TIZANIDINE HCL 4 MG PO TABS: 4.0000 mg | ORAL_TABLET | Freq: Every evening | ORAL | Status: DC | PRN

## 2013-11-10 MED ORDER — DIPHENHYDRAMINE HCL 25 MG PO TABS: 12.5000 mg | ORAL_TABLET | Freq: Every evening | ORAL | Status: DC | PRN

## 2013-11-10 NOTE — Patient Instructions (Signed)
Try to hold Vytorin x 1 week - see if cramps are better  Try CoQ10 Compression hose - knee high

## 2013-11-10 NOTE — Assessment & Plan Note (Signed)
Continue with current prescription therapy as reflected on the Med list. Labs  

## 2013-11-10 NOTE — Assessment & Plan Note (Signed)
Continue with current prescription therapy as reflected on the Med list.  

## 2013-11-10 NOTE — Progress Notes (Signed)
   Subjective:    Patient ID: [name], male    DOB: 05-Sep-1928, 80 y.o.   MRN: [id number]  HPI  Pt is switching from Dr Linda Hedges C/o leg cramps at night x long time  The patient presents for a follow-up of  chronic hypertension, chronic dyslipidemia, type 2 diabetes controlled with medicines or diet    Review of Systems  Constitutional: Negative for appetite change, fatigue and unexpected weight change.  HENT: Positive for hearing loss. Negative for congestion, ear discharge, nosebleeds, sneezing, sore throat and trouble swallowing.   Eyes: Negative for itching and visual disturbance.  Respiratory: Negative for cough.   Cardiovascular: Negative for chest pain, palpitations and leg swelling.  Gastrointestinal: Negative for nausea, diarrhea, blood in stool and abdominal distention.  Genitourinary: Negative for frequency and hematuria.  Musculoskeletal: Negative for back pain, gait problem, joint swelling and neck pain.  Skin: Negative for rash.  Neurological: Negative for dizziness, tremors, speech difficulty and weakness.  Psychiatric/Behavioral: Negative for sleep disturbance, dysphoric mood and agitation. The patient is not nervous/anxious.   cramps     Objective:   Physical Exam  Constitutional: He is oriented to person, place, and time. He appears well-developed. No distress.  NAD  HENT:  Mouth/Throat: Oropharynx is clear and moist.  B ear wax  Eyes: Conjunctivae are normal. Pupils are equal, round, and reactive to light. Left eye exhibits no discharge.  Neck: Normal range of motion. No JVD present. No thyromegaly present.  Cardiovascular: Normal rate, regular rhythm, normal heart sounds and intact distal pulses.  Exam reveals no gallop and no friction rub.   No murmur heard. Pulmonary/Chest: Effort normal and breath sounds normal. No respiratory distress. He has no wheezes. He has no rales. He exhibits no tenderness.  Abdominal: Soft. Bowel sounds are normal. He  exhibits no distension and no mass. There is no tenderness. There is no rebound and no guarding.  Musculoskeletal: Normal range of motion. He exhibits no edema and no tenderness.  Lymphadenopathy:    He has no cervical adenopathy.  Neurological: He is alert and oriented to person, place, and time. He has normal reflexes. No cranial nerve deficit. He exhibits normal muscle tone. He displays a negative Romberg sign. Coordination and gait normal.  No meningeal signs  Skin: Skin is warm and dry. No rash noted.  Psychiatric: He has a normal mood and affect. His behavior is normal. Judgment and thought content normal.      Wax on L   Procedure Note :     Procedure :  Ear irrigation   Indication:  Cerumen impaction L   Risks, including pain, dizziness, eardrum perforation, bleeding, infection and others as well as benefits were explained to the patient in detail. Verbal consent was obtained and the patient agreed to proceed.    We used "The Elephant Ear Irrigation Device" filled with lukewarm water for irrigation. A large amount wax was recovered. Procedure has also required manual wax removal with an ear loop.   Tolerated well. Complications: None.   Postprocedure instructions :  Call if problems.      Assessment & Plan:

## 2013-11-10 NOTE — Progress Notes (Signed)
Pre visit review using our clinic review tool, if applicable. No additional management support is needed unless otherwise documented below in the visit note/SLS  

## 2013-11-10 NOTE — Assessment & Plan Note (Signed)
Will irrigate 

## 2013-11-10 NOTE — Assessment & Plan Note (Signed)
Try to hold Vytorin x 1 week - see if cramps are better  Try CoQ10 Compression hose - knee high   

## 2013-11-10 NOTE — Progress Notes (Signed)
D/C a duplicate listing on Vytorin/sls

## 2013-11-11 ENCOUNTER — Telehealth: Payer: Self-pay | Admitting: Internal Medicine

## 2013-11-11 NOTE — Telephone Encounter (Signed)
Relevant patient education assigned to patient using Emmi. ° °

## 2014-01-12 ENCOUNTER — Other Ambulatory Visit: Payer: Self-pay | Admitting: Cardiology

## 2014-01-31 ENCOUNTER — Emergency Department (HOSPITAL_COMMUNITY)
Admission: EM | Admit: 2014-01-31 | Discharge: 2014-01-31 | Disposition: A | Discharge status: Home / Self Care | Payer: Medicare Other | Attending: Emergency Medicine | Admitting: Emergency Medicine

## 2014-01-31 ENCOUNTER — Encounter (HOSPITAL_COMMUNITY): Payer: Self-pay | Admitting: Emergency Medicine

## 2014-01-31 DIAGNOSIS — S51809A Unspecified open wound of unspecified forearm, initial encounter: Secondary | ICD-10-CM | POA: Insufficient documentation

## 2014-01-31 DIAGNOSIS — I2510 Atherosclerotic heart disease of native coronary artery without angina pectoris: Secondary | ICD-10-CM | POA: Insufficient documentation

## 2014-01-31 DIAGNOSIS — Z7982 Long term (current) use of aspirin: Secondary | ICD-10-CM | POA: Insufficient documentation

## 2014-01-31 DIAGNOSIS — Y9389 Activity, other specified: Secondary | ICD-10-CM | POA: Insufficient documentation

## 2014-01-31 DIAGNOSIS — Z8551 Personal history of malignant neoplasm of bladder: Secondary | ICD-10-CM | POA: Insufficient documentation

## 2014-01-31 DIAGNOSIS — W268XXA Contact with other sharp object(s), not elsewhere classified, initial encounter: Secondary | ICD-10-CM | POA: Insufficient documentation

## 2014-01-31 DIAGNOSIS — Z8619 Personal history of other infectious and parasitic diseases: Secondary | ICD-10-CM | POA: Insufficient documentation

## 2014-01-31 DIAGNOSIS — IMO0002 Reserved for concepts with insufficient information to code with codable children: Secondary | ICD-10-CM

## 2014-01-31 DIAGNOSIS — Z8673 Personal history of transient ischemic attack (TIA), and cerebral infarction without residual deficits: Secondary | ICD-10-CM | POA: Insufficient documentation

## 2014-01-31 DIAGNOSIS — Y929 Unspecified place or not applicable: Secondary | ICD-10-CM | POA: Insufficient documentation

## 2014-01-31 DIAGNOSIS — Z951 Presence of aortocoronary bypass graft: Secondary | ICD-10-CM | POA: Insufficient documentation

## 2014-01-31 DIAGNOSIS — Z79899 Other long term (current) drug therapy: Secondary | ICD-10-CM | POA: Insufficient documentation

## 2014-01-31 DIAGNOSIS — Z87448 Personal history of other diseases of urinary system: Secondary | ICD-10-CM | POA: Insufficient documentation

## 2014-01-31 DIAGNOSIS — Z85820 Personal history of malignant melanoma of skin: Secondary | ICD-10-CM | POA: Insufficient documentation

## 2014-01-31 DIAGNOSIS — R011 Cardiac murmur, unspecified: Secondary | ICD-10-CM | POA: Insufficient documentation

## 2014-01-31 DIAGNOSIS — Z9861 Coronary angioplasty status: Secondary | ICD-10-CM | POA: Insufficient documentation

## 2014-01-31 DIAGNOSIS — I252 Old myocardial infarction: Secondary | ICD-10-CM | POA: Insufficient documentation

## 2014-01-31 DIAGNOSIS — I498 Other specified cardiac arrhythmias: Secondary | ICD-10-CM | POA: Insufficient documentation

## 2014-01-31 NOTE — Discharge Instructions (Signed)
Call for a follow up appointment with a Family or Primary Care Provider to verify your last tetanus vaccination. Return if Symptoms worsen, you develop fever, drainage, redness at wound.   Take medication as prescribed.  Keep the wound clean and dried.

## 2014-01-31 NOTE — ED Notes (Addendum)
C/o R FA lac keeps bleeding, not healing on its own. Cut arm 2d ago with the bottom of a lawn mower (metal). Placed abx ointment on it and a bandage, but it keeps bleeding. Posterior R FA wound appears macerated, original bandage left in place. Takes daily ASA. Pt of Dr. Alain Marion. Td UTD. No s/sx of infection.

## 2014-01-31 NOTE — ED Notes (Signed)
Dressing applied to rt forearm.

## 2014-01-31 NOTE — ED Provider Notes (Signed)
CSN: [id number]     Arrival date & time 01/31/14  2108 History  This chart was scribed for non-physician practitioner working with Babette Relic, MD by Mercy Moore, ED Scribe. This patient was seen in room TR09C/TR09C and the patient's care was started at 10:29 PM.   Chief Complaint  Patient presents with  . Extremity Laceration     HPI Comments: [name] is a 79 y.o. male who presents to the Emergency Department laceration to his right forearm, incurred two days ago. Patient cut his arm on lawn mower blade while attempting to change it. Patient bandaged laceration treated with Neosporin ointment. Patient reports continued bleeding and active bleeding tonight after getting out of the shower. Patient taking Asprin 325 mg.  Patient fever, chills, numbness/tingling in right hand or extremity.    Patient reports Tetanus vaccination 1-2 years ago.  The history is provided by the patient. No language interpreter was used.    Past Medical History  Diagnosis Date  . Aortic valve disorders   . Postsurgical aortocoronary bypass status   . Old myocardial infarction   . Peripheral vascular disease, unspecified   . Coronary atherosclerosis of unspecified type of vessel, native or graft   . Occlusion and stenosis of carotid artery without mention of cerebral infarction   . Other and unspecified hyperlipidemia   . Cerebrovascular disease, unspecified   . Atherosclerosis of renal artery   . Postsurgical percutaneous transluminal coronary angioplasty status   . Scarlet fever   . Malignant neoplasm of bladder, part unspecified   . BPH (benign prostatic hypertrophy)   . Unspecified sleep apnea   . History of cystoscopy     w/ bladder biopsy  . Melanoma in situ of back 2012    area of left scapula   Past Surgical History  Procedure Laterality Date  . Cystoscopy      w/ bladder biopsy  . Percutaneous transluminal coronary angioplasty    . Coronary artery bypass graft    . Redo median  sternotomy      extracorporeal circulation, aortic valve replacement using a 23-mm Mitroflow aortic pericardial heart valve.  Surgeon- Gilford Raid, MD 04-18-10  . Melanoma excision  2012    left scapular  area - clean margins.    Family History  Problem Relation Age of Onset  . Hypertension Mother   . Diabetes Mother   . Hyperlipidemia Mother   . Coronary artery disease Mother   . Heart disease Mother     CAD, cardiomyopathy  . Coronary artery disease Father   . Sudden death Father     1 wk post MI  . Heart attack Father   . Heart disease Father     CAD/MI-sudden death  . Cancer Other     prostate  . Birth defects Neg Hx    History  Substance Use Topics  . Smoking status: Never Smoker   . Smokeless tobacco: Never Used  . Alcohol Use: Yes     Comment: rare    Review of Systems  Constitutional: Negative for fever and chills.  Skin: Positive for wound.  Neurological: Negative for weakness and numbness.  Psychiatric/Behavioral: Negative for confusion.      Allergies  Sulfonamide derivatives  Home Medications   Prior to Admission medications   Medication Sig Start Date End Date Taking? Authorizing Provider  aspirin 325 MG EC tablet Take 325 mg by mouth daily.      Historical Provider, MD  cholecalciferol (VITAMIN D)  1000 UNITS tablet Take 1 tablet (1,000 Units total) by mouth daily. 11/10/13 11/10/14  Aleksei Plotnikov V, MD  diphenhydrAMINE (BENADRYL) 25 MG tablet Take 0.5-1 tablets (12.5-25 mg total) by mouth at bedtime as needed for sleep (cramps). 11/10/13   Aleksei Plotnikov V, MD  fish oil-omega-3 fatty acids 1000 MG capsule Take 1 g by mouth 2 (two) times daily.     Historical Provider, MD  lisinopril (PRINIVIL,ZESTRIL) 20 MG tablet TAKE 1 TABLET (20 MG TOTAL) BY MOUTH DAILY.    Sherren Mocha, MD  metoprolol (LOPRESSOR) 50 MG tablet Take 1 tablet (50 mg total) by mouth 2 (two) times daily. 08/06/13   Sherren Mocha, MD  multivitamin [facility]) tablet Take 1 tablet  by mouth daily.      Historical Provider, MD  nitroGLYCERIN (NITROSTAT) 0.4 MG SL tablet Place 1 tablet (0.4 mg total) under the tongue every 5 (five) minutes as needed for chest pain. 02/15/12 11/10/13  Renella Cunas, MD  tamsulosin (FLOMAX) 0.4 MG CAPS capsule  07/20/13   Historical Provider, MD  tiZANidine (ZANAFLEX) 4 MG tablet Take 1 tablet (4 mg total) by mouth at bedtime as needed for muscle spasms (cramps). 11/10/13   Aleksei Plotnikov V, MD  VYTORIN 10-40 MG per tablet TAKE 1 TABLET BY MOUTH AT BEDTIME. 12/10/11   Renella Cunas, MD   Triage Vitals: BP 155/56  Pulse 52  Temp(Src) 98.3 F (36.8 C)  Resp 20  Ht 5\' 6"  (1.676 m)  Wt 155 lb (70.308 kg)  BMI 25.03 kg/m2  SpO2 96% Physical Exam  Nursing note and vitals reviewed. Constitutional: He is oriented to person, place, and time. He appears well-developed and well-nourished.  Non-toxic appearance. He does not have a sickly appearance. He does not appear ill. No distress.  HENT:  Head: Normocephalic and atraumatic.  Eyes: EOM are normal.  Neck: Neck supple.  Cardiovascular: Regular rhythm.  Bradycardia present.   Murmur heard. Pulses:      Radial pulses are 2+ on the right side, and 2+ on the left side.  Pulmonary/Chest: Effort normal and breath sounds normal. No respiratory distress. He has no wheezes. He has no rales. He exhibits no tenderness.  Musculoskeletal: Normal range of motion.  Neurological: He is alert and oriented to person, place, and time.  Skin: Skin is warm and dry. Laceration noted. He is not diaphoretic.  Right forearm 3 cm curved laceration/skin tear to the right forearm minimal oozing blood on exam no obvious foreign body. Neurovascular intact distally. No erythema, drainage noted.  Psychiatric: He has a normal mood and affect. His behavior is normal.    ED Course  Procedures (including critical care time) COORDINATION OF CARE: 10:33 PM- Discussed treatment plan with patient at bedside and patient agreed to  plan.   MDM   Final diagnoses:  Laceration   Patient presents 2 days after a laceration to right forearm. Bleeding controlled on exam, no signs of infection. Last tetanus one year ago. Discussed with Dr. Rebeca Alert who also evaluated the patient during this encounter. Advises Wound Seal after cleaning out a laceration and redressing. Wound care performed, pt tolerated without complaints. Discussed treatment plan with the patient. Return precautions given. Reports understanding and no other concerns at this time.  Patient is stable for discharge at this time.  I personally performed the services described in this documentation, which was scribed in my presence. The recorded information has been reviewed and is accurate.    Lorrine Kin, PA-C 01/31/14  2328 

## 2014-01-31 NOTE — ED Provider Notes (Signed)
Medical screening examination/treatment/procedure(s) were conducted as a shared visit with non-physician practitioner(s) and myself.  I personally evaluated the patient during the encounter.  Right mid forearm partial-thickness skin tear without cellulitis or purulent drainage but has had intermittent slow oozing bleeding for the last couple days despite repeated local pressure at home to control bleeding   Babette Relic, MD 02/01/14 1327

## 2014-01-31 NOTE — ED Notes (Signed)
EDP at bedside  

## 2014-02-02 ENCOUNTER — Telehealth: Payer: Self-pay | Admitting: Internal Medicine

## 2014-02-02 ENCOUNTER — Ambulatory Visit: Payer: Medicare Other | Admitting: Internal Medicine

## 2014-02-02 ENCOUNTER — Emergency Department (HOSPITAL_COMMUNITY)
Admission: EM | Admit: 2014-02-02 | Discharge: 2014-02-02 | Disposition: A | Discharge status: Home / Self Care | Payer: Medicare Other | Source: Home / Self Care | Attending: Emergency Medicine | Admitting: Emergency Medicine

## 2014-02-02 ENCOUNTER — Encounter (HOSPITAL_COMMUNITY): Payer: Self-pay | Admitting: Emergency Medicine

## 2014-02-02 ENCOUNTER — Telehealth: Payer: Self-pay | Admitting: *Deleted

## 2014-02-02 DIAGNOSIS — S51809A Unspecified open wound of unspecified forearm, initial encounter: Secondary | ICD-10-CM

## 2014-02-02 DIAGNOSIS — M7989 Other specified soft tissue disorders: Secondary | ICD-10-CM

## 2014-02-02 DIAGNOSIS — S51801D Unspecified open wound of right forearm, subsequent encounter: Secondary | ICD-10-CM

## 2014-02-02 MED ORDER — CLINDAMYCIN HCL 300 MG PO CAPS: 300.0000 mg | ORAL_CAPSULE | Freq: Three times a day (TID) | ORAL | Status: DC

## 2014-02-02 NOTE — ED Provider Notes (Signed)
Medical screening examination/treatment/procedure(s) were performed by resident physician or non-physician practitioner and as supervising physician I was immediately available for consultation/collaboration.  Maryruth Eve, MD     Melony Overly, MD 02/02/14 [phone]

## 2014-02-02 NOTE — ED Provider Notes (Signed)
CSN: [id number]     Arrival date & time 02/02/14  1143 History   First MD Initiated Contact with Patient 02/02/14 1159     Chief Complaint  Patient presents with  . Follow-up   (Consider location/radiation/quality/duration/timing/severity/associated sxs/prior Treatment) HPI Comments: 80 year old male presents for followup. He was seen in the emergency department not quite 2 days ago for evaluation of a laceration to his right forearm. They were having difficulty getting the bleeding to stop, and were finally able to get it to stop with the wound seal. He was told to followup with his primary care today, however he cannot get in to see them so he came back here. He is concerned because he feels like his right hand may be swelling. He does admit that the bandage was very tight and they have not changed it since it was placed. He denies any pain in his right hand, although he does say it is slightly uncomfortable. He denies any drainage from the wound, although he does say it started to bleed slightly when he first got back to the room and took the bandage off today, but that has already stopped. No, no numbness in the hand   Past Medical History  Diagnosis Date  . Aortic valve disorders   . Postsurgical aortocoronary bypass status   . Old myocardial infarction   . Peripheral vascular disease, unspecified   . Coronary atherosclerosis of unspecified type of vessel, native or graft   . Occlusion and stenosis of carotid artery without mention of cerebral infarction   . Other and unspecified hyperlipidemia   . Cerebrovascular disease, unspecified   . Atherosclerosis of renal artery   . Postsurgical percutaneous transluminal coronary angioplasty status   . Scarlet fever   . Malignant neoplasm of bladder, part unspecified   . BPH (benign prostatic hypertrophy)   . Unspecified sleep apnea   . History of cystoscopy     w/ bladder biopsy  . Melanoma in situ of back 2012    area of left scapula    Past Surgical History  Procedure Laterality Date  . Cystoscopy      w/ bladder biopsy  . Percutaneous transluminal coronary angioplasty    . Coronary artery bypass graft    . Redo median sternotomy      extracorporeal circulation, aortic valve replacement using a 23-mm Mitroflow aortic pericardial heart valve.  Surgeon- Gilford Raid, MD 04-18-10  . Melanoma excision  2012    left scapular  area - clean margins.    Family History  Problem Relation Age of Onset  . Hypertension Mother   . Diabetes Mother   . Hyperlipidemia Mother   . Coronary artery disease Mother   . Heart disease Mother     CAD, cardiomyopathy  . Coronary artery disease Father   . Sudden death Father     1 wk post MI  . Heart attack Father   . Heart disease Father     CAD/MI-sudden death  . Cancer Other     prostate  . Birth defects Neg Hx    History  Substance Use Topics  . Smoking status: Never Smoker   . Smokeless tobacco: Never Used  . Alcohol Use: Yes     Comment: rare    Review of Systems  Musculoskeletal:       Swelling of right hand  Skin: Positive for wound.  All other systems reviewed and are negative.   Allergies  Sulfonamide derivatives  Home Medications  Prior to Admission medications   Medication Sig Start Date End Date Taking? Authorizing Provider  aspirin 325 MG EC tablet Take 325 mg by mouth daily.     Yes Historical Provider, MD  lisinopril (PRINIVIL,ZESTRIL) 20 MG tablet TAKE 1 TABLET (20 MG TOTAL) BY MOUTH DAILY.   Yes Sherren Mocha, MD  metoprolol (LOPRESSOR) 50 MG tablet Take 1 tablet (50 mg total) by mouth 2 (two) times daily. 08/06/13  Yes Sherren Mocha, MD  tamsulosin [facility]) 0.4 MG CAPS capsule  07/20/13  Yes Historical Provider, MD  tiZANidine (ZANAFLEX) 4 MG tablet Take 1 tablet (4 mg total) by mouth at bedtime as needed for muscle spasms (cramps). 11/10/13  Yes Aleksei Plotnikov V, MD  VYTORIN 10-40 MG per tablet TAKE 1 TABLET BY MOUTH AT BEDTIME. 12/10/11  Yes  Renella Cunas, MD  cholecalciferol (VITAMIN D) 1000 UNITS tablet Take 1 tablet (1,000 Units total) by mouth daily. 11/10/13 11/10/14  Aleksei Plotnikov V, MD  clindamycin (CLEOCIN) 300 MG capsule Take 1 capsule (300 mg total) by mouth 3 (three) times daily. 02/02/14   Liam Graham, PA-C  diphenhydrAMINE (BENADRYL) 25 MG tablet Take 0.5-1 tablets (12.5-25 mg total) by mouth at bedtime as needed for sleep (cramps). 11/10/13   Aleksei Plotnikov V, MD  fish oil-omega-3 fatty acids 1000 MG capsule Take 1 g by mouth 2 (two) times daily.     Historical Provider, MD  multivitamin [facility]) tablet Take 1 tablet by mouth daily.      Historical Provider, MD  nitroGLYCERIN (NITROSTAT) 0.4 MG SL tablet Place 1 tablet (0.4 mg total) under the tongue every 5 (five) minutes as needed for chest pain. 02/15/12 11/10/13  Renella Cunas, MD   BP 146/57  Pulse 50  Temp(Src) 98.1 F (36.7 C) (Oral)  Resp 20  SpO2 99% Physical Exam  Nursing note and vitals reviewed. Constitutional: He is oriented to person, place, and time. He appears well-developed and well-nourished. No distress.  HENT:  Head: Normocephalic.  Cardiovascular:  Pulses:      Radial pulses are 2+ on the right side.  Pulmonary/Chest: Effort normal. No respiratory distress.  Musculoskeletal:       Right forearm: He exhibits laceration (laceration to the dorsal forearm, with a scab overlying it, no redness, swelling, or drainage. There is a very small amount of dried blood on the arm adjacent to this but no active bleeding).       Left hand: He exhibits swelling (Very mild swelling without erythema, warmth, or tenderness, diffusely about the hand). He exhibits normal range of motion, no tenderness, normal capillary refill and no deformity. Normal sensation noted. Normal strength noted.  Neurological: He is alert and oriented to person, place, and time. Coordination normal.  Skin: Skin is warm and dry. No rash noted. He is not diaphoretic.  Psychiatric:  He has a normal mood and affect. Judgment normal.    ED Course  Procedures (including critical care time) Labs Review Labs Reviewed - No data to display  Imaging Review No results found.   MDM   1. Wound, open, forearm, right, subsequent encounter   2. Swelling of right hand    I suspect this swelling was slightly due to a tourniquet effect of the bandage, however we'll put him on antibiotics to prevent wound infection. Also he was given a wrist splints to keep him from tearing this open any more, thereby reducing the chance of infection.  Meds ordered this encounter  Medications  .  clindamycin (CLEOCIN) 300 MG capsule    Sig: Take 1 capsule (300 mg total) by mouth 3 (three) times daily.    Dispense:  21 capsule    Refill:  0    Order Specific Question:  Supervising Provider    Answer:  Melony Overly [[id number]]       Liam Graham, PA-C 02/02/14 [phone]

## 2014-02-02 NOTE — Telephone Encounter (Signed)
[facility] Triage Call Report Triage Record Num: [id number] Operator: Doug Sou Patient Name: [name] Call Date & Time: 01/31/2014 8:04:04PM Patient Phone: [phone] PCP: Walker Kehr Patient Gender: Male PCP Fax : [phone] Patient DOB: 11/19/1928 Practice Name: [facility] Reason for Call: Caller: Franki Cabot; PCP: Walker Kehr (Adults only); CB#: [phone]; Call regarding injury to right arm, bleeding; States the patient was trying to put a blade on the lawnmower on 01/29/14 and the blade slipped and cut his arm. Per caller the laceration is about the size of a serving spoon. Caller states his wife applied Neosporin and wrapped the wound after the injury. States after he showered on 7/17 and 7/18 he had blood spurting from the wound and states he has a puncture to the wound. States he had a large amount of bleeding from the wound on 7/18 and states he had to hold his arm over the sink because it was dripping. Per caller last Tetanus was 1 or 2 years ago. Triaged per Abrasions, Lacerations, Puncture Wounds guideline. To activate EMS 911 due to blood spurting from despite firm pressure or large amount of blood loss. Care advice given. Advised to call 911 and caller states he will have his wife take him to [facility]. Protocol(s) Used: Abrasions, Lacerations, Puncture Wounds Recommended Outcome per Protocol: Activate EMS 911 Reason for Outcome: Blood spurting from wound despite firm pressure or large amount of blood loss (such as soaked hand towel) Care Advice: ~ Do not give the patient anything to eat or drink. ~ IMMEDIATE ACTION Write down provider's name. List or place the following in a bag for transport with the patient: current prescription and/or nonprescription medications; alternative treatments, therapies and medications; and street drugs. ~ Control Bleeding: - Cover wound with a clean cloth and apply firm pressure to control bleeding. - If  bleeding continues through dressing, add more material. - DO NOT remove old dressing. - Continue to apply direct pressure. ~ 01/31/2014 8:19:26PM Page 1 of 1 [id number]

## 2014-02-02 NOTE — Telephone Encounter (Signed)
Patient was at hospital Saturday night from a cut on right arm received Thursday.  ER told him to see his physician today to have bandages removed and looked at.  There are no appointment open for today.  Please advise.

## 2014-02-02 NOTE — Discharge Instructions (Signed)
Open Wound, Forearm An open wound is a break in the skin because of an injury. An open wound can be a scrape, cut, or puncture to the skin. Good wound care will help to:   Reduce pain.  Prevent infection.  Reduce scaring. HOME CARE  Rest and raise (elevate) the injured arm on a pillow. This will help keep the puffiness (swelling) down.  Wash all dirt off the wound.  Clean the wounds daily with gentle soap and water.  Apply medicated cream after the wound has been cleaned or as told by your doctor.  Apply a clean bandage (dressing) daily if necessary. GET HELP RIGHT AWAY IF:   There is increased redness or puffiness in or around the wound.  Your pain increases.  You or your child has a temperature by mouth above 102 F (38.9 C), not controlled by medicine.  Your baby is older than 3 months with a rectal temperature of 102 F (38.9 C) or higher.  Your baby is 76 months old or younger with a rectal temperature of 100.4 F (38 C) or higher.  A yellowish white fluid (pus) comes from the wound.  Your pain is not controlled with pain relievers.  You cannot move or feel your fingers.  There is red streaking of the skin that goes above or below the wound. MAKE SURE YOU:   Understand these instructions.  Will watch your condition.  Will get help right away if you are not doing well or get worse. Document Released: 09/29/2008 Document Revised: 09/25/2011 Document Reviewed: 09/29/2008 [facility] Patient Information 2015 [place], [place]. This information is not intended to replace advice given to you by your health care provider. Make sure you discuss any questions you have with your health care provider.  Non-Sutured Laceration A laceration is a cut or wound that goes through all layers of the skin and into the tissue just beneath the skin. Usually, these are stitched up or held together with tape or glue shortly after the injury occurred. However, if several or more hours have  passed before getting care, too many germs (bacteria) get into the laceration. Stitching it closed would bring the risk of infection. If your health care provider feels your laceration is too old, it may be left open and then bandaged to allow healing from the bottom layer up. HOME CARE INSTRUCTIONS   Change the bandage (dressing) 2 times a day or as directed by your health care provider.  If the dressing or packing gauze sticks, soak it off with soapy water.  When you re-bandage your laceration, make sure that the dressing or packing gauze goes all the way to the bottom of the laceration. The top of the laceration is kept open so it can heal from the bottom up. There is less chance for infection with this method.  Wash the area with soap and water 2 times a day to remove all the creams or ointments, if used. Rinse off the soap. Pat the area dry with a clean towel. Look for signs of infection, such as redness, swelling, or a red line that goes away from the laceration.  Re-apply creams or ointments if they were used to bandage the laceration. This helps keep the bandage from sticking.  If the bandage becomes wet, dirty, or has a bad smell, change it as soon as possible.  Only take medicine as directed by your health care provider. You might need a tetanus shot now if:  You have no idea when  you had the last one.  You have never had a tetanus shot before.  Your laceration had dirt in it.  Your laceration was dirty, and your last tetanus shot was more than 7 years ago.  Your laceration was clean, and your last tetanus shot was more than 10 years ago. If you need a tetanus shot, and you decide not to get one, there is a rare chance of getting tetanus. Sickness from tetanus can be serious. If you got a tetanus shot, your arm may swell and get red and warm to the touch at the shot site. This is common and not a problem. SEEK MEDICAL CARE IF:   You have redness, swelling, or increasing pain  in the laceration.  You notice a red line that goes away from your laceration.  You have pus coming from the laceration.  You have a fever.  You notice a bad smell coming from the laceration or dressing.  You notice something coming out of the laceration, such as wood or glass.  Your laceration is on your hand or foot and you are unable to properly move a finger or toe.  You have severe swelling around the laceration, causing pain and numbness.  You notice a change in color in your arm, hand, leg, or foot. MAKE SURE YOU:   Understand these instructions.  Will watch your condition.  Will get help right away if you are not doing well or get worse. Document Released: 05/31/2006 Document Revised: 07/08/2013 Document Reviewed: 12/21/2008 [facility] Patient Information 2015 [place], [place]. This information is not intended to replace advice given to you by your health care provider. Make sure you discuss any questions you have with your health care provider.

## 2014-02-02 NOTE — ED Notes (Signed)
Pt reports he was seen at [facility] ER for laceration to right forearm on 7/18 Cut arm w/bottom of lawnmower Today, he is concerned about swelling of right hand and poss infection Has appt w/PCP tomorrow Alert w/no signs of acute distress.

## 2014-02-02 NOTE — Telephone Encounter (Signed)
Got patient in for 4:15 with Dr. Alain Marion.  Left a message for patient to give a call back to notify.

## 2014-02-03 ENCOUNTER — Ambulatory Visit: Payer: Medicare Other | Admitting: Internal Medicine

## 2014-02-10 ENCOUNTER — Encounter: Payer: Self-pay | Admitting: Cardiovascular Disease

## 2014-02-10 ENCOUNTER — Ambulatory Visit (INDEPENDENT_AMBULATORY_CARE_PROVIDER_SITE_OTHER): Payer: Medicare Other | Admitting: Cardiovascular Disease

## 2014-02-10 VITALS — BP 140/70 | HR 56 | Ht 66.0 in | Wt 147.0 lb

## 2014-02-10 DIAGNOSIS — I359 Nonrheumatic aortic valve disorder, unspecified: Secondary | ICD-10-CM

## 2014-02-10 DIAGNOSIS — I2510 Atherosclerotic heart disease of native coronary artery without angina pectoris: Secondary | ICD-10-CM

## 2014-02-10 MED ORDER — SIMVASTATIN 40 MG PO TABS: 40.0000 mg | ORAL_TABLET | Freq: Every day | ORAL | Status: DC

## 2014-02-10 NOTE — Progress Notes (Signed)
HPI:  80 year old gentleman presenting for followup evaluation.  The patient has aortic valve disease status post aortic valve replacement. He has been noted to have severe left ventricular hypertrophy.   The patient underwent multivessel CABG in 1995. He then went on to develop severe symptomatic aortic stenosis. He underwent aortic valve replacement in 2011 with a 23 mm mitroflow aortic pericardial heart valve. His last echocardiogram in January 2015 demonstrated a mildly increased transaortic valve gradient and normal LV systolic function with severe LVH.  From a cardiac perspective he is stable. No change in mild DOE. No chest pain or pressure. He is still active with yardwork. He was having problems with leg cramps but these have resolved after he started drinking [employer].   Outpatient Encounter Prescriptions as of 02/10/2014  Medication Sig  . aspirin 325 MG EC tablet Take 325 mg by mouth daily.    . cholecalciferol (VITAMIN D) 1000 UNITS tablet Take 1 tablet (1,000 Units total) by mouth daily.  . diphenhydrAMINE (BENADRYL) 25 MG tablet Take 0.5-1 tablets (12.5-25 mg total) by mouth at bedtime as needed for sleep (cramps).  . fish oil-omega-3 fatty acids 1000 MG capsule Take 1 g by mouth 2 (two) times daily.   Marland Kitchen lisinopril (PRINIVIL,ZESTRIL) 20 MG tablet TAKE 1 TABLET (20 MG TOTAL) BY MOUTH DAILY.  . metoprolol (LOPRESSOR) 50 MG tablet Take 1 tablet (50 mg total) by mouth 2 (two) times daily.  . multivitamin (THERAGRAN) tablet Take 1 tablet by mouth daily.    . tamsulosin (FLOMAX) 0.4 MG CAPS capsule   . tiZANidine (ZANAFLEX) 4 MG tablet Take 1 tablet (4 mg total) by mouth at bedtime as needed for muscle spasms (cramps).  . VYTORIN 10-40 MG per tablet TAKE 1 TABLET BY MOUTH AT BEDTIME.  . nitroGLYCERIN (NITROSTAT) 0.4 MG SL tablet Place 1 tablet (0.4 mg total) under the tongue every 5 (five) minutes as needed for chest pain.  . [DISCONTINUED] clindamycin (CLEOCIN) 300 MG capsule  Take 1 capsule (300 mg total) by mouth 3 (three) times daily.  . [DISCONTINUED] lisinopril (PRINIVIL,ZESTRIL) 20 MG tablet TAKE 1 TABLET (20 MG TOTAL) BY MOUTH DAILY.    Allergies  Allergen Reactions  . Sulfonamide Derivatives     Past Medical History  Diagnosis Date  . Aortic valve disorders   . Postsurgical aortocoronary bypass status   . Old myocardial infarction   . Peripheral vascular disease, unspecified   . Coronary atherosclerosis of unspecified type of vessel, native or graft   . Occlusion and stenosis of carotid artery without mention of cerebral infarction   . Other and unspecified hyperlipidemia   . Cerebrovascular disease, unspecified   . Atherosclerosis of renal artery   . Postsurgical percutaneous transluminal coronary angioplasty status   . Scarlet fever   . Malignant neoplasm of bladder, part unspecified   . BPH (benign prostatic hypertrophy)   . Unspecified sleep apnea   . History of cystoscopy     w/ bladder biopsy  . Melanoma in situ of back 2012    area of left scapula    ROS: Negative except as per HPI  BP 140/70  Pulse 56  Ht 5\' 6"  (1.676 m)  Wt 147 lb (66.679 kg)  BMI 23.74 kg/m2  PHYSICAL EXAM: Pt is alert and oriented, pleasant elderly male in NAD HEENT: normal Neck: JVP - normal, carotids 2+= with bilateral bruits Lungs: CTA bilaterally CV: RRR with grade 2/6 harsh mid-peaking systolic murmur best heard at the left  lower sternal border Abd: soft, NT, Positive BS, no hepatomegaly Ext: no C/C/E, distal pulses intact and equal Skin: warm/dry no rash  EKG:  Sinus bradycardia with first degree AV block 56 beats per minute, right bundle branch block, left axis deviation.  2-D echocardiogram August 14 2013: Study Conclusions  - Left ventricle: The cavity size was normal. Wall thickness was increased in a pattern of mild LVH. There was mild focal basal hypertrophy of the septum. Systolic function was normal. The estimated ejection fraction  was in the range of 60% to 65%. Wall motion was normal; there were no regional wall motion abnormalities. Features are consistent with a pseudonormal left ventricular filling pattern, with concomitant abnormal relaxation and increased filling pressure (grade 2 diastolic dysfunction). Doppler parameters are consistent with high ventricular filling pressure. - Aortic valve: A bioprosthesis was present. There was moderate stenosis. - Mitral valve: Calcified annulus. Moderate regurgitation. - Left atrium: The atrium was mildly dilated. Impressions:  - Normal LV function; bioprosthetic aortic valve with mean gradient higher than expected (28 mmHg). Compared to study of 10/13, mean aortic valve gradient has increased mildly.   ASSESSMENT AND PLAN: 1. Aortic valve disorder, status post bioprosthetic aortic valve replacement. Elevated gradients noted at last echocardiogram. Will repeat an echocardiogram in January 2016. I will plan on seeing him back in one year.  2. Coronary artery disease status post CABG. No symptoms of angina. The patient is on appropriate medical therapy with aspirin, beta blocker, and ACE inhibitor, and a statin drug.  3. Hypertension. Blood pressure is controlled on his current medical program.  4. Hyperlipidemia. Vytorin cost has become prohibitive. His lipids are excellent with a total cholesterol of 101. I recommended that he switch to generic simvastatin. He has upcoming labs with Dr. Alain Marion in  about one month.  5. Asymptomatic carotid stenosis. 60-79% bilateral ICA stenosis followed by serial duplex scans.  Sherren Mocha 02/10/2014 8:51 AM

## 2014-02-10 NOTE — Patient Instructions (Signed)
Your physician has recommended you make the following change in your medication: STOP Vytorin, START Simvastatin 40mg  take one by mouth every evening  Please have follow-up labs with PCP during physical.   Your physician has requested that you have an echocardiogram in January 2016. Echocardiography is a painless test that uses sound waves to create images of your heart. It provides your doctor with information about the size and shape of your heart and how well your heart's chambers and valves are working. This procedure takes approximately one hour. There are no restrictions for this procedure.  Your physician wants you to follow-up in: 1 YEAR with Dr Burt Knack.  You will receive a reminder letter in the mail two months in advance. If you don't receive a letter, please call our office to schedule the follow-up appointment.

## 2014-02-25 ENCOUNTER — Telehealth: Payer: Self-pay | Admitting: Internal Medicine

## 2014-02-25 NOTE — Telephone Encounter (Signed)
Patient Information:  Caller Name: Molli Posey  Phone: [phone]  Patient: [name], [name]  Gender: Male  DOB: 1929/01/19  Age: 80 Years  PCP: Plotnikov, Alex (Adults only)  Office Follow Up:  Does the office need to follow up with this patient?: No  Instructions For The Office: N/A  RN Note:  Bled around scab edges 02/25/14; not currently bleeding.  Edema in right hand resolved. No purulent drainage or spreading redness.  Concerned slow healing wound  right forearm present 3 weeks. No appointments remain in [place] office for 02/25/14 or 02/26/14.  Offered and declined UC.  Requested appointment for 02/26/14.  Scheduled to see Dr. Yong Channel 02/26/14 at 0900.    Symptoms  Reason For Call & Symptoms: Bleeding intermittently from under "tight scab" on right dorsal forearm s/p avulsion/laceration 01/31/14 while cleaning blade of lawnmower.  Reviewed Health History In EMR: Yes  Reviewed Medications In EMR: Yes  Reviewed Allergies In EMR: Yes  Reviewed Surgeries / Procedures: Yes  Date of Onset of Symptoms: 02/25/2014  Treatments Tried: ED visit 01/31/14 and UC visit 02/02/14, kept wound dry, finished Cleocin, wrist splints, applies dressing to forearm after showers with non stick pad and gauze.  Treatments Tried Worked: No  Guideline(s) Used:  Wound Infection  Skin Injury  Disposition Per Guideline:   See Today in Office  Reason For Disposition Reached:   Patient wants to be seen  Advice Given:  Call Back If:  You become worse.  Patient Will Follow Care Advice:  YES  Appointment Scheduled:  02/26/2014 09:00:00 Appointment Scheduled Provider:  Other

## 2014-02-26 ENCOUNTER — Encounter: Payer: Self-pay | Admitting: Family Medicine

## 2014-02-26 ENCOUNTER — Ambulatory Visit (INDEPENDENT_AMBULATORY_CARE_PROVIDER_SITE_OTHER): Payer: Medicare Other | Admitting: Family Medicine

## 2014-02-26 VITALS — BP 130/62 | HR 52 | Temp 97.9°F | Ht 66.0 in | Wt 147.0 lb

## 2014-02-26 DIAGNOSIS — S51801D Unspecified open wound of right forearm, subsequent encounter: Secondary | ICD-10-CM

## 2014-02-26 DIAGNOSIS — Z5189 Encounter for other specified aftercare: Secondary | ICD-10-CM

## 2014-02-26 DIAGNOSIS — S51809A Unspecified open wound of unspecified forearm, initial encounter: Secondary | ICD-10-CM

## 2014-02-26 NOTE — Progress Notes (Signed)
Garret Reddish, MD Phone: [phone]  Subjective:   [name] is a 80 y.o. year old very pleasant male patient who presents with the following:  Concern for nonhealing wound Laceration to right forearm about a month ago on 7/18 and seen in ED as well as urgent care a few days later on 7/20. Patient used to have some swelling in his hand but this resolved with cleocin prescribed by urgent care. Admits to easy bruising and bleeding. Is on an aspirin. He states he had a large scab grow over the area and over last week or so his bleeding and oozing had resolved. He had a few episodes of bruising/oozing in each of last 2 days so sought care ROS- no fevers/chills/expanding redness. No paresthesias.   Past Medical History- HTN, bradycardia, aortic stenosis, history MI, CAD, renal artery stenosis, history CABG  Medications- reviewed and updated Current Outpatient Prescriptions  Medication Sig Dispense Refill  . aspirin 325 MG EC tablet Take 325 mg by mouth daily.        . cholecalciferol (VITAMIN D) 1000 UNITS tablet Take 1 tablet (1,000 Units total) by mouth daily.  100 tablet  3  . diphenhydrAMINE (BENADRYL) 25 MG tablet Take 0.5-1 tablets (12.5-25 mg total) by mouth at bedtime as needed for sleep (cramps).  100 tablet  3  . fish oil-omega-3 fatty acids 1000 MG capsule Take 1 g by mouth 2 (two) times daily.       Marland Kitchen lisinopril (PRINIVIL,ZESTRIL) 20 MG tablet TAKE 1 TABLET (20 MG TOTAL) BY MOUTH DAILY.  90 tablet  0  . metoprolol (LOPRESSOR) 50 MG tablet Take 1 tablet (50 mg total) by mouth 2 (two) times daily.  180 tablet  3  . multivitamin (THERAGRAN) tablet Take 1 tablet by mouth daily.        . simvastatin (ZOCOR) 40 MG tablet Take 1 tablet (40 mg total) by mouth at bedtime.  90 tablet  3  . tamsulosin (FLOMAX) 0.4 MG CAPS capsule       . tiZANidine (ZANAFLEX) 4 MG tablet Take 1 tablet (4 mg total) by mouth at bedtime as needed for muscle spasms (cramps).  30 tablet  3  . nitroGLYCERIN  (NITROSTAT) 0.4 MG SL tablet Place 1 tablet (0.4 mg total) under the tongue every 5 (five) minutes as needed for chest pain.  25 tablet  6   No current facility-administered medications for this visit.    Objective: BP 130/62  Pulse 52  Temp(Src) 97.9 F (36.6 C)  Ht 5\' 6"  (1.676 m)  Wt 147 lb (66.679 kg)  BMI 23.74 kg/m2 Gen: NAD, resting comfortably in chair CV: 2+ dp pulses Ext: no edema in hand. Large at least 4-5 cm by 4-5 cm loose scab noted. This was removed and underneath a shallow 1.5 x 1.5 cm wound with good granulation tissue with healing scar border out 1.5 cm on each edge (essentially appears to be about 33% as small as original wound) Skin: warm, dry Neuro: grossly normal, moves all extremities  Assessment/Plan:  Wound, right forearm, concern for not healing Concern was due to some bruising and bleeding. Review of records and history shows easy bruising/bleeding in past as well as thromboyctopenia with platelets ranging from 80-150 on checks. Advised patient to follow up with PCP about thrombocytopenia. Overall, wound appears to be improving significantly to me but patient continues with easy bleeding or bruising. I advised him to trial out of the wrist splint he has as the shearing  force may make things worse. Advised 2 week follow up with PCP and gave warning signs for sooner return.

## 2014-02-26 NOTE — Patient Instructions (Signed)
Wound is healing well. It is about 33% of original size. It may continue to ooze and bleed considering your aspirin and history of low platelets. Please follow up with Dr. Alain Marion in 2-3 weeks or sooner if area worsens (or fever/chills/expanding redness). I would try to keep area open when able. Otherwise, cover with light amount of neosporin as well as a nonadhesive dressing. Trial to see how things go with and without the brace. Wonderful to meet you!

## 2014-03-11 ENCOUNTER — Encounter: Payer: Self-pay | Admitting: Internal Medicine

## 2014-03-11 ENCOUNTER — Ambulatory Visit (INDEPENDENT_AMBULATORY_CARE_PROVIDER_SITE_OTHER): Payer: Medicare Other | Admitting: Internal Medicine

## 2014-03-11 ENCOUNTER — Other Ambulatory Visit (INDEPENDENT_AMBULATORY_CARE_PROVIDER_SITE_OTHER): Payer: Medicare Other

## 2014-03-11 VITALS — BP 130/72 | HR 49 | Temp 97.8°F | Ht 66.0 in | Wt 148.0 lb

## 2014-03-11 DIAGNOSIS — R252 Cramp and spasm: Secondary | ICD-10-CM

## 2014-03-11 DIAGNOSIS — Z0000 Encounter for general adult medical examination without abnormal findings: Secondary | ICD-10-CM

## 2014-03-11 DIAGNOSIS — C679 Malignant neoplasm of bladder, unspecified: Secondary | ICD-10-CM

## 2014-03-11 DIAGNOSIS — E039 Hypothyroidism, unspecified: Secondary | ICD-10-CM

## 2014-03-11 DIAGNOSIS — E785 Hyperlipidemia, unspecified: Secondary | ICD-10-CM

## 2014-03-11 DIAGNOSIS — R7309 Other abnormal glucose: Secondary | ICD-10-CM

## 2014-03-11 DIAGNOSIS — I498 Other specified cardiac arrhythmias: Secondary | ICD-10-CM

## 2014-03-11 DIAGNOSIS — R001 Bradycardia, unspecified: Secondary | ICD-10-CM

## 2014-03-11 DIAGNOSIS — I10 Essential (primary) hypertension: Secondary | ICD-10-CM

## 2014-03-11 DIAGNOSIS — I739 Peripheral vascular disease, unspecified: Secondary | ICD-10-CM

## 2014-03-11 DIAGNOSIS — I359 Nonrheumatic aortic valve disorder, unspecified: Secondary | ICD-10-CM

## 2014-03-11 DIAGNOSIS — I2510 Atherosclerotic heart disease of native coronary artery without angina pectoris: Secondary | ICD-10-CM

## 2014-03-11 DIAGNOSIS — E038 Other specified hypothyroidism: Secondary | ICD-10-CM

## 2014-03-11 DIAGNOSIS — R739 Hyperglycemia, unspecified: Secondary | ICD-10-CM

## 2014-03-11 DIAGNOSIS — Z23 Encounter for immunization: Secondary | ICD-10-CM

## 2014-03-11 LAB — HEPATIC FUNCTION PANEL
ALT: 26 U/L (ref 0–53)
AST: 31 U/L (ref 0–37)
Albumin: 3.6 g/dL (ref 3.5–5.2)
Alkaline Phosphatase: 40 U/L (ref 39–117)
BILIRUBIN DIRECT: 0.1 mg/dL (ref 0.0–0.3)
TOTAL PROTEIN: 6.4 g/dL (ref 6.0–8.3)
Total Bilirubin: 0.5 mg/dL (ref 0.2–1.2)

## 2014-03-11 LAB — BASIC METABOLIC PANEL
BUN: 27 mg/dL — ABNORMAL HIGH (ref 6–23)
CALCIUM: 9 mg/dL (ref 8.4–10.5)
CO2: 25 mEq/L (ref 19–32)
Chloride: 108 mEq/L (ref 96–112)
Creatinine, Ser: 1.4 mg/dL (ref 0.4–1.5)
GFR: 50.61 mL/min — AB (ref >60.00)
Glucose, Bld: 103 mg/dL — ABNORMAL HIGH (ref 70–99)
Potassium: 4.6 mEq/L (ref 3.5–5.1)
Sodium: 139 mEq/L (ref 135–145)

## 2014-03-11 LAB — CK: Total CK: 94 U/L (ref 7–232)

## 2014-03-11 LAB — HEMOGLOBIN A1C: HEMOGLOBIN A1C: 5.7 % (ref 4.6–6.5)

## 2014-03-11 LAB — TSH: TSH: 2.8 u[IU]/mL (ref 0.35–4.50)

## 2014-03-11 MED ORDER — NITROGLYCERIN 0.4 MG SL SUBL: 0.4000 mg | SUBLINGUAL_TABLET | SUBLINGUAL | Status: DC | PRN

## 2014-03-11 NOTE — Assessment & Plan Note (Signed)
Continue with current prescription therapy as reflected on the Med list.  

## 2014-03-11 NOTE — Assessment & Plan Note (Signed)
Chronic  Dr Burt Knack Continue with current prescription therapy as reflected on the Med list.

## 2014-03-11 NOTE — Progress Notes (Signed)
Pre visit review using our clinic review tool, if applicable. No additional management support is needed unless otherwise documented below in the visit note. 

## 2014-03-11 NOTE — Assessment & Plan Note (Addendum)
Continue with current prescription therapy as reflected on the Med list.  

## 2014-03-11 NOTE — Progress Notes (Signed)
Subjective:    Patient ID: [name], male    DOB: February 04, 1929, 80 y.o.   MRN: [id number]  HPI  Former Dr MEN pt New pt  The patient is here for a wellness exam. The patient has been doing well overall without major physical or psychological issues going on lately.  The patient needs to address  chronic hypertension that has been well controlled with medicines; to address chronic  hyperlipidemia controlled with medicines as well; and to address CAD, PVD, controlled with medical treatment and diet, bladder cancer  Wt Readings from Last 3 Encounters:  03/11/14 148 lb (67.132 kg)  02/26/14 147 lb (66.679 kg)  02/10/14 147 lb (66.679 kg)   BP Readings from Last 3 Encounters:  03/11/14 130/72  02/26/14 130/62  02/10/14 140/70      Review of Systems  Constitutional: Negative for appetite change, fatigue and unexpected weight change.  HENT: Negative for congestion, nosebleeds, sneezing, sore throat and trouble swallowing.   Eyes: Negative for itching and visual disturbance.  Respiratory: Negative for cough.   Cardiovascular: Negative for chest pain, palpitations and leg swelling.  Gastrointestinal: Negative for nausea, diarrhea, blood in stool and abdominal distention.  Genitourinary: Negative for dysuria, frequency and hematuria.  Musculoskeletal: Negative for back pain, gait problem, joint swelling and neck pain.  Skin: Negative for rash.  Neurological: Negative for dizziness, tremors, speech difficulty and weakness.  Psychiatric/Behavioral: Negative for suicidal ideas, hallucinations, sleep disturbance, dysphoric mood and agitation. The patient is not nervous/anxious.        Objective:   Physical Exam  Constitutional: He is oriented to person, place, and time. He appears well-developed and well-nourished. No distress.  HENT:  Head: Normocephalic and atraumatic.  Right Ear: External ear normal.  Left Ear: External ear normal.  Nose: Nose normal.  Mouth/Throat:  Oropharynx is clear and moist. No oropharyngeal exudate.  Eyes: Conjunctivae and EOM are normal. Pupils are equal, round, and reactive to light. Right eye exhibits no discharge. Left eye exhibits no discharge. No scleral icterus.  Neck: Normal range of motion. Neck supple. No JVD present. No tracheal deviation present. No thyromegaly present.  Cardiovascular: Normal rate, regular rhythm and intact distal pulses.  Exam reveals no gallop and no friction rub.   Murmur heard. Brady 2/6 murmur  Pulmonary/Chest: Effort normal and breath sounds normal. No stridor. No respiratory distress. He has no wheezes. He has no rales. He exhibits no tenderness.  Abdominal: Soft. Bowel sounds are normal. He exhibits no distension and no mass. There is no tenderness. There is no rebound and no guarding.  Genitourinary:  Per Urology  Musculoskeletal: Normal range of motion. He exhibits no edema and no tenderness.  Lymphadenopathy:    He has no cervical adenopathy.  Neurological: He is alert and oriented to person, place, and time. He has normal reflexes. No cranial nerve deficit. He exhibits normal muscle tone. Coordination normal.  Skin: Skin is warm and dry. No rash noted. He is not diaphoretic. No erythema. No pallor.  Psychiatric: He has a normal mood and affect. His behavior is normal. Judgment and thought content normal.    Lab Results  Component Value Date   WBC 5.0 01/09/2011   HGB 12.6* 03/10/2013   HCT 37.3* 03/10/2013   PLT 125.0* 01/09/2011   GLUCOSE 103* 03/10/2013   CHOL 101 03/10/2013   TRIG 44.0 03/10/2013   HDL 41.90 03/10/2013   LDLCALC 50 03/10/2013   ALT 19 03/10/2013   ALT 19 03/10/2013  AST 25 03/10/2013   AST 25 03/10/2013   NA 140 03/10/2013   K 4.1 03/10/2013   CL 109 03/10/2013   CREATININE 1.5 03/10/2013   BUN 29* 03/10/2013   CO2 24 03/10/2013   TSH 1.90 03/10/2013   PSA 1.65 01/08/2009   INR 1.49 04/18/2010   HGBA1C  Value: 5.5 (NOTE)                                                                        According to the ADA Clinical Practice Recommendations for 2011, when HbA1c is used as a screening test:   >=6.5%   Diagnostic of Diabetes Mellitus           (if abnormal result  is confirmed)  5.7-6.4%   Increased risk of developing Diabetes Mellitus  References:Diagnosis and Classification of Diabetes Mellitus,Diabetes CHEN,2778,24(MPNTI 1):S62-S69 and Standards of Medical Care in         Diabetes - 2011,Diabetes RWER,1540,08  (Suppl 1):S11-S61. 04/14/2010         Assessment & Plan:

## 2014-03-11 NOTE — Assessment & Plan Note (Signed)
HR is 49: cut back on Metoprolol (1/2) if low HR

## 2014-03-11 NOTE — Assessment & Plan Note (Signed)
Dr Junious Silk  BCG treatment '02 - tumor free since

## 2014-03-11 NOTE — Assessment & Plan Note (Addendum)
Here for medicare wellness/physical  Diet: heart healthy  Physical activity: not sedentary  Depression/mood screen: negative  Hearing: intact to whispered voice  Visual acuity: grossly normal, performs annual eye exam  ADLs: capable  Fall risk: none  Home safety: good  Cognitive evaluation: intact to orientation, naming, recall and repetition  EOL planning: adv directives, full code/ I agree  I have personally reviewed and have noted  1. The patient's medical and social history  2. Their use of alcohol, tobacco or illicit drugs  3. Their current medications and supplements  4. The patient's functional ability including ADL's, fall risks, home safety risks and hearing or visual impairment.  5. Diet and physical activities  6. Evidence for depression or mood disorders    Today patient counseled on age appropriate routine health concerns for screening and prevention, each reviewed and up to date or declined. Immunizations reviewed and up to date or declined. Labs ordered and reviewed. Risk factors for depression reviewed and negative. Hearing function and visual acuity are intact. ADLs screened and addressed as needed. Functional ability and level of safety reviewed and appropriate. Education, counseling and referrals performed based on assessed risks today. Patient provided with a copy of personalized plan for preventive services.  Prostate exam, PSA tests - per Urology No need for colonoscopies per Dr Wynetta Emery

## 2014-03-11 NOTE — Patient Instructions (Signed)

## 2014-03-12 ENCOUNTER — Telehealth: Payer: Self-pay | Admitting: Geriatric Medicine

## 2014-03-12 NOTE — Telephone Encounter (Signed)
Message copied by Alger Memos on Thu Mar 12, 2014  4:24 PM ------      Message from: Cassandria Anger      Created: Thu Mar 12, 2014  4:16 PM       Erline Levine, please, inform patient that all labs are OK      Thank you!       ------

## 2014-03-12 NOTE — Telephone Encounter (Signed)
Patient aware of normal labs

## 2014-04-15 ENCOUNTER — Encounter: Payer: Self-pay | Admitting: Physician Assistant

## 2014-04-15 ENCOUNTER — Ambulatory Visit (INDEPENDENT_AMBULATORY_CARE_PROVIDER_SITE_OTHER): Payer: Medicare Other | Admitting: Physician Assistant

## 2014-04-15 VITALS — BP 148/70 | HR 51 | Ht 66.0 in | Wt 148.4 lb

## 2014-04-15 DIAGNOSIS — I10 Essential (primary) hypertension: Secondary | ICD-10-CM

## 2014-04-15 DIAGNOSIS — I2510 Atherosclerotic heart disease of native coronary artery without angina pectoris: Secondary | ICD-10-CM

## 2014-04-15 DIAGNOSIS — I701 Atherosclerosis of renal artery: Secondary | ICD-10-CM

## 2014-04-15 DIAGNOSIS — Z951 Presence of aortocoronary bypass graft: Secondary | ICD-10-CM

## 2014-04-15 MED ORDER — LISINOPRIL 40 MG PO TABS
40.0000 mg | ORAL_TABLET | Freq: Every day | ORAL | Status: DC
Start: 2014-04-15 — End: 2014-04-16

## 2014-04-15 NOTE — Assessment & Plan Note (Addendum)
Patient's blood pressure has been elevated recently. He did have severe LVH on echo in January 2015. Will increase lisinopril to 40 mg once daily. I will see him acting 2 weeks for followup. He is to call if he has anymore the shaking chills sensation.

## 2014-04-15 NOTE — Patient Instructions (Addendum)
Your physician has recommended you make the following change in your medication:   1.  START TAKING  LISINOPRIL TO 40 MG ONCE A DAY  PRESCRIPTION  SENT TO PHARMACY    Your physician recommends that you schedule a follow-up appointment in:  [place] IN 1 MONTH    COME BACK FOR BMET IN 2 WEEKS 04/29/14    Your physician has requested that you have a renal artery duplex. During this test, an ultrasound is used to evaluate blood flow to the kidneys. Allow one hour for this exam. Do not eat after midnight the day before and avoid carbonated beverages. Take your medications as you usually do.     Low-Sodium Eating Plan Sodium raises blood pressure and causes water to be held in the body. Getting less sodium from food will help lower your blood pressure, reduce any swelling, and protect your heart, liver, and kidneys. We get sodium by adding salt (sodium chloride) to food. Most of our sodium comes from canned, boxed, and frozen foods. Restaurant foods, fast foods, and pizza are also very high in sodium. Even if you take medicine to lower your blood pressure or to reduce fluid in your body, getting less sodium from your food is important.    WHAT IS MY PLAN? Most people should limit their sodium intake to 2,000 mg a day.     WHAT DO I NEED TO KNOW ABOUT THIS EATING PLAN? For the low-sodium eating plan, you will follow these general guidelines:  Choose foods with a % Daily Value for sodium of less than 5% (as listed on the food label).   Use salt-free seasonings or herbs instead of table salt or sea salt.   Check with your health care provider or pharmacist before using salt substitutes.   Eat fresh foods.  Eat more vegetables and fruits.  Limit canned vegetables. If you do use them, rinse them well to decrease the sodium.   Limit cheese to 1 oz (28 g) per day.   Eat lower-sodium products, often labeled as "lower sodium" or "no salt  added."  Avoid foods that contain monosodium glutamate (MSG). MSG is sometimes added to [place] food and some canned foods.  Check food labels (Nutrition Facts labels) on foods to learn how much sodium is in one serving.  Eat more home-cooked food and less restaurant, buffet, and fast food.  When eating at a restaurant, ask that your food be prepared with less salt or none, if possible.  HOW DO I READ FOOD LABELS FOR SODIUM INFORMATION? The Nutrition Facts label lists the amount of sodium in one serving of the food. If you eat more than one serving, you must multiply the listed amount of sodium by the number of servings. Food labels may also identify foods as:  Sodium free--Less than 5 mg in a serving.  Very low sodium--35 mg or less in a serving.  Low sodium--140 mg or less in a serving.  Light in sodium--50% less sodium in a serving. For example, if a food that usually has 300 mg of sodium is changed to become light in sodium, it will have 150 mg of sodium.  Reduced sodium--25% less sodium in a serving. For example, if a food that usually has 400 mg of sodium is changed to reduced sodium, it will have 300 mg of sodium. WHAT FOODS CAN I EAT? Grains Low-sodium cereals, including oats, puffed wheat and rice, and shredded wheat cereals. Low-sodium  crackers. Unsalted rice and pasta. Lower-sodium bread.  Vegetables Frozen or fresh vegetables. Low-sodium or reduced-sodium canned vegetables. Low-sodium or reduced-sodium tomato sauce and paste. Low-sodium or reduced-sodium tomato and vegetable juices.  Fruits Fresh, frozen, and canned fruit. Fruit juice.  Meat and Other Protein Products Low-sodium canned tuna and salmon. Fresh or frozen meat, poultry, seafood, and fish. Lamb. Unsalted nuts. Dried beans, peas, and lentils without added salt. Unsalted canned beans. Homemade soups without salt. Eggs.  Dairy Milk. Soy milk. Ricotta cheese. Low-sodium or reduced-sodium cheeses. Yogurt.   Condiments Fresh and dried herbs and spices. Salt-free seasonings. Onion and garlic powders. Low-sodium varieties of mustard and ketchup. Lemon juice.  Fats and Oils Reduced-sodium salad dressings. Unsalted butter.  Other Unsalted popcorn and pretzels.  The items listed above may not be a complete list of recommended foods or beverages. Contact your dietitian for more options. WHAT FOODS ARE NOT RECOMMENDED? Grains Instant hot cereals. Bread stuffing, pancake, and biscuit mixes. Croutons. Seasoned rice or pasta mixes. Noodle soup cups. Boxed or frozen macaroni and cheese. Self-rising flour. Regular salted crackers. Vegetables Regular canned vegetables. Regular canned tomato sauce and paste. Regular tomato and vegetable juices. Frozen vegetables in sauces. Salted french fries. Olives. Angie Fava. Relishes. Sauerkraut. Salsa. Meat and Other Protein Products Salted, canned, smoked, spiced, or pickled meats, seafood, or fish. Bacon, ham, sausage, hot dogs, corned beef, chipped beef, and packaged luncheon meats. Salt pork. Jerky. Pickled herring. Anchovies, regular canned tuna, and sardines. Salted nuts. Dairy Processed cheese and cheese spreads. Cheese curds. Blue cheese and cottage cheese. Buttermilk.  Condiments Onion and garlic salt, seasoned salt, table salt, and sea salt. Canned and packaged gravies. Worcestershire sauce. Tartar sauce. Barbecue sauce. Teriyaki sauce. Soy sauce, including reduced sodium. Steak sauce. Fish sauce. Oyster sauce. Cocktail sauce. Horseradish. Regular ketchup and mustard. Meat flavorings and tenderizers. Bouillon cubes. Hot sauce. Tabasco sauce. Marinades. Taco seasonings. Relishes. Fats and Oils Regular salad dressings. Salted butter. Margarine. Ghee. Bacon fat.  Other Potato and tortilla chips. Corn chips and puffs. Salted popcorn and pretzels. Canned or dried soups. Pizza. Frozen entrees and pot pies.  The items listed above may not be a complete list  of foods and beverages to avoid. Contact your dietitian for more information. Document Released: 12/23/2001 Document Revised: 07/08/2013 Document Reviewed: 05/07/2013 [facility] Patient Information 2015 [place], [place]. This information is not intended to replace advice given to you by your health care provider. Make sure you discuss any questions you have with your health care provider.

## 2014-04-15 NOTE — Progress Notes (Signed)
[id number] is an 80 year old male patient of Dr. Burt Knack who has history of coronary artery disease status post CABG in 1995. He then developed severe symptomatic aortic stenosis and underwent aortic valve replacement in 2011 with a 23 mm mitral flow aortic pericardial heart valve. Last echo in January 2015 demonstrated a mildly increased transaortic valve gradient and normal LV systolic function with severe LVH. He last saw Dr. Burt Knack in 02/10/14 at which time has mild dyspnea on exertion had not changed.  The patient comes in today because of elevated blood pressures. He had an episode last week that awakened him from sleep. He says he developed severe chills and trembling. He felt like he was having anxiety but has never had anxiety. He checked his blood pressure and it was 202/73 pulse  64. He denied any chest pain, palpitations, dyspnea, dyspnea on exertion, dizziness, or presyncope. He said the chills and anxiety feeling occurred 4 times that day. He's been checking his blood pressure daily since then and it has been elevated. He watches his salt closely.  Allergies  Allergen Reactions  . Sulfonamide Derivatives      Current Outpatient Prescriptions  Medication Sig Dispense Refill  . aspirin 325 MG EC tablet Take 325 mg by mouth daily.        . cholecalciferol (VITAMIN D) 1000 UNITS tablet Take 1 tablet (1,000 Units total) by mouth daily.  100 tablet  3  . diphenhydrAMINE (BENADRYL) 25 MG tablet Take 0.5-1 tablets (12.5-25 mg total) by mouth at bedtime as needed for sleep (cramps).  100 tablet  3  . fish oil-omega-3 fatty acids 1000 MG capsule Take 1 g by mouth 2 (two) times daily.       Marland Kitchen lisinopril (PRINIVIL,ZESTRIL) 20 MG tablet TAKE 1 TABLET (20 MG TOTAL) BY MOUTH DAILY.  90 tablet  0  . metoprolol (LOPRESSOR) 50 MG tablet Take 1 tablet (50 mg total) by mouth 2 (two) times daily.  180 tablet  3  . multivitamin (THERAGRAN) tablet Take 1 tablet by mouth daily.        .  nitroGLYCERIN (NITROSTAT) 0.4 MG SL tablet Place 0.4 mg under the tongue every 5 (five) minutes as needed for chest pain (MAX 3 TABLETS).      Marland Kitchen simvastatin (ZOCOR) 40 MG tablet Take 1 tablet (40 mg total) by mouth at bedtime.  90 tablet  3  . tamsulosin (FLOMAX) 0.4 MG CAPS capsule       . tiZANidine (ZANAFLEX) 4 MG tablet Take 1 tablet (4 mg total) by mouth at bedtime as needed for muscle spasms (cramps).  30 tablet  3   No current facility-administered medications for this visit.    Past Medical History  Diagnosis Date  . Aortic valve disorders   . Postsurgical aortocoronary bypass status   . Old myocardial infarction   . Peripheral vascular disease, unspecified   . Coronary atherosclerosis of unspecified type of vessel, native or graft   . Occlusion and stenosis of carotid artery without mention of cerebral infarction   . Other and unspecified hyperlipidemia   . Cerebrovascular disease, unspecified   . Atherosclerosis of renal artery   . Postsurgical percutaneous transluminal coronary angioplasty status   . Scarlet fever   . Malignant neoplasm of bladder, part unspecified   . BPH (benign prostatic hypertrophy)   . Unspecified sleep apnea   . History of cystoscopy     w/ bladder biopsy  . Melanoma in situ of back  2012    area of left scapula    Past Surgical History  Procedure Laterality Date  . Cystoscopy      w/ bladder biopsy  . Percutaneous transluminal coronary angioplasty    . Coronary artery bypass graft    . Redo median sternotomy      extracorporeal circulation, aortic valve replacement using a 23-mm Mitroflow aortic pericardial heart valve.  Surgeon- Gilford Raid, MD 04-18-10  . Melanoma excision  2012    left scapular  area - clean margins.     Family History  Problem Relation Age of Onset  . Hypertension Mother   . Diabetes Mother   . Hyperlipidemia Mother   . Coronary artery disease Mother   . Heart disease Mother     CAD, cardiomyopathy  . Coronary  artery disease Father   . Sudden death Father     1 wk post MI  . Heart attack Father   . Heart disease Father     CAD/MI-sudden death  . Cancer Other     prostate  . Birth defects Neg Hx     History   Social History  . Marital Status: Married    Spouse Name: N/A    Number of Children: 3  . Years of Education: 25   Occupational History  . Chief Financial Officer   . retired    Social History Main Topics  . Smoking status: Never Smoker   . Smokeless tobacco: Never Used  . Alcohol Use: Yes     Comment: rare  . Drug Use: No  . Sexual Activity: Not Currently   Other Topics Concern  . Not on file   Social History Narrative   HSG, [place], did some graduate work. Married '52. 1 son - '61; 2 daughters -'71, '63: grandchildren 50; great-grands 47. 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 Active gardner, Designer, jewellery. good marriage.    ROS: See history of present illness otherwise negative  BP 148/70  Pulse 51  Ht 5\' 6"  (1.676 m)  Wt 148 lb 6.4 oz (67.314 kg)  BMI 23.96 kg/m2  PHYSICAL EXAM: Well-nournished, in no acute distress. Neck: No JVD, HJR, Bruit, or thyroid enlargement  Lungs: No tachypnea, clear without wheezing, rales, or rhonchi  Cardiovascular: RRR, PMI not displaced, 2/6 systolic murmur at the left sternal border, no bruit, thrill, or heave.  Abdomen: BS normal. Soft without organomegaly, masses, lesions or tenderness.  Extremities: without cyanosis, clubbing or edema. Good distal pulses bilateral  SKin: Warm, no lesions or rashes   Musculoskeletal: No deformities  Neuro: no focal signs   Wt Readings from Last 3 Encounters:  03/11/14 148 lb (67.132 kg)  02/26/14 147 lb (66.679 kg)  02/10/14 147 lb (66.679 kg)     EKG: Sinus bradycardia 51 beats per minute with right bundle branch block first degree AV block, unchanged from prior tracing   03/14/2010: Cardiac Cath Findings:  Assessment: 1. Coronary artery disease with all grafts  patent. 2. Normal Left ventricular function with a highly calcified aortic valve. 3. Moderate aortic stenosis by catherterization. Severe by echo. 4. Peripheral arterial disease with apparent high-grade right renal artery stenosis.

## 2014-04-15 NOTE — Assessment & Plan Note (Signed)
No complaints of chest pain

## 2014-04-15 NOTE — Assessment & Plan Note (Signed)
History of renal artery stenosis found incidentally in 2011. Could be contributing to Hypertension. Will check renal ultrasound.

## 2014-04-16 ENCOUNTER — Other Ambulatory Visit: Payer: Self-pay | Admitting: *Deleted

## 2014-04-16 MED ORDER — LISINOPRIL 40 MG PO TABS
40.0000 mg | ORAL_TABLET | Freq: Every day | ORAL | Status: DC
Start: 2014-04-16 — End: 2014-07-07

## 2014-04-27 ENCOUNTER — Other Ambulatory Visit (HOSPITAL_COMMUNITY): Payer: Self-pay | Admitting: Cardiology

## 2014-04-27 DIAGNOSIS — I6523 Occlusion and stenosis of bilateral carotid arteries: Secondary | ICD-10-CM

## 2014-04-29 ENCOUNTER — Ambulatory Visit (HOSPITAL_COMMUNITY): Payer: Medicare Other | Attending: Cardiovascular Disease | Admitting: Cardiology

## 2014-04-29 ENCOUNTER — Other Ambulatory Visit (INDEPENDENT_AMBULATORY_CARE_PROVIDER_SITE_OTHER): Payer: Medicare Other | Admitting: *Deleted

## 2014-04-29 DIAGNOSIS — E785 Hyperlipidemia, unspecified: Secondary | ICD-10-CM | POA: Insufficient documentation

## 2014-04-29 DIAGNOSIS — I701 Atherosclerosis of renal artery: Secondary | ICD-10-CM

## 2014-04-29 DIAGNOSIS — I739 Peripheral vascular disease, unspecified: Secondary | ICD-10-CM | POA: Diagnosis not present

## 2014-04-29 DIAGNOSIS — I2510 Atherosclerotic heart disease of native coronary artery without angina pectoris: Secondary | ICD-10-CM | POA: Diagnosis not present

## 2014-04-29 DIAGNOSIS — Z951 Presence of aortocoronary bypass graft: Secondary | ICD-10-CM | POA: Diagnosis not present

## 2014-04-29 DIAGNOSIS — I10 Essential (primary) hypertension: Secondary | ICD-10-CM

## 2014-04-29 LAB — BASIC METABOLIC PANEL
BUN: 30 mg/dL — ABNORMAL HIGH (ref 6–23)
CHLORIDE: 110 meq/L (ref 96–112)
CO2: 26 mEq/L (ref 19–32)
CREATININE: 1.4 mg/dL (ref 0.4–1.5)
Calcium: 8.8 mg/dL (ref 8.4–10.5)
GFR: 49.78 mL/min — AB (ref >60.00)
Glucose, Bld: 98 mg/dL (ref 70–99)
Potassium: 4.2 mEq/L (ref 3.5–5.1)
Sodium: 140 mEq/L (ref 135–145)

## 2014-04-29 NOTE — Progress Notes (Signed)
Renal artery duplex performed  

## 2014-05-04 ENCOUNTER — Encounter: Payer: Self-pay | Admitting: Physician Assistant

## 2014-05-04 ENCOUNTER — Ambulatory Visit: Payer: Medicare Other | Admitting: Physician Assistant

## 2014-05-04 ENCOUNTER — Ambulatory Visit (INDEPENDENT_AMBULATORY_CARE_PROVIDER_SITE_OTHER): Payer: Medicare Other | Admitting: Physician Assistant

## 2014-05-04 ENCOUNTER — Ambulatory Visit (HOSPITAL_COMMUNITY): Payer: Medicare Other | Attending: Cardiology | Admitting: Cardiology

## 2014-05-04 VITALS — BP 150/80 | HR 58 | Ht 66.0 in | Wt 148.8 lb

## 2014-05-04 DIAGNOSIS — I739 Peripheral vascular disease, unspecified: Secondary | ICD-10-CM | POA: Diagnosis not present

## 2014-05-04 DIAGNOSIS — I10 Essential (primary) hypertension: Secondary | ICD-10-CM | POA: Diagnosis not present

## 2014-05-04 DIAGNOSIS — I25799 Atherosclerosis of other coronary artery bypass graft(s) with unspecified angina pectoris: Secondary | ICD-10-CM

## 2014-05-04 DIAGNOSIS — E785 Hyperlipidemia, unspecified: Secondary | ICD-10-CM | POA: Insufficient documentation

## 2014-05-04 DIAGNOSIS — I25810 Atherosclerosis of coronary artery bypass graft(s) without angina pectoris: Secondary | ICD-10-CM

## 2014-05-04 DIAGNOSIS — I2510 Atherosclerotic heart disease of native coronary artery without angina pectoris: Secondary | ICD-10-CM | POA: Insufficient documentation

## 2014-05-04 DIAGNOSIS — Z951 Presence of aortocoronary bypass graft: Secondary | ICD-10-CM | POA: Insufficient documentation

## 2014-05-04 DIAGNOSIS — I6523 Occlusion and stenosis of bilateral carotid arteries: Secondary | ICD-10-CM

## 2014-05-04 DIAGNOSIS — I701 Atherosclerosis of renal artery: Secondary | ICD-10-CM

## 2014-05-04 MED ORDER — HYDROCHLOROTHIAZIDE 25 MG PO TABS: 25.0000 mg | ORAL_TABLET | Freq: Every day | ORAL | Status: DC

## 2014-05-04 MED ORDER — METOPROLOL SUCCINATE ER 25 MG PO TB24: 25.0000 mg | ORAL_TABLET | Freq: Every day | ORAL | Status: DC

## 2014-05-04 NOTE — Assessment & Plan Note (Signed)
Duplex showed bilateral renal artery stenosis. Reviewed with Dr. Burt Knack. He recommends possible angiogram and referral to Dr. Gwenlyn Found.

## 2014-05-04 NOTE — Assessment & Plan Note (Signed)
Blood pressure is better on the increased lisinopril. He decrease his metoprolol because of bradycardia. Blood pressure is still elevated. We'll add hydrochlorothiazide 25 mg once daily. He also was found to have renal artery stenosis on renal ultrasound. We will refer to Dr. Gwenlyn Found for possible angiogram. Continue 2 g sodium diet.

## 2014-05-04 NOTE — Assessment & Plan Note (Signed)
Patient has bilateral ICA. Dopplers just performed today and are stable. Followup in 1 year.

## 2014-05-04 NOTE — Progress Notes (Signed)
[id number]:[id number] is an 80 year old male patient of Dr. Burt Knack who has history of coronary artery disease status post CABG in 1995. He then developed severe symptomatic aortic stenosis and underwent aortic valve replacement in 2011 with a 23 mm mitral flow aortic pericardial heart valve. Last echo in January 2015 demonstrated a mildly increased transaortic valve gradient and normal LV systolic function with severe LVH. He last saw Dr. Burt Knack in 02/10/14 at which time has mild dyspnea on exertion had not changed.  I saw him 2 weeks ago with significantly elevated blood pressures. He did have severe LVH on echo in January 2015 increase his lisinopril to 40 mg once daily. I also ordered a renal ultrasound because of history of renal artery stenosis found incidentally in 2011. Duplex on 04/29/14 did show bilateral renal artery stenosis and was reviewed by Dr. Burt Knack who recommends referral to Dr. Fletcher Anon or Gwenlyn Found for possible angiography.  Patient comes in today saying that the Lisinopril has brought his blood pressure down to 384 to 665L systolic. His heart rate has been running as low as 45 so he decrease his metoprolol to 50 mg once daily on his own. He says his pulses, but the 50s since then and he feels better. He also complains of bilateral leg cramps it is working in the yard a lot but it's improved greatly since he drinks Smart water.   Allergies  Allergen Reactions  . Sulfonamide Derivatives Hives     Current Outpatient Prescriptions  Medication Sig Dispense Refill  . Ascorbic Acid (VITAMIN C PO) Take 1 capsule by mouth daily.      Marland Kitchen aspirin 325 MG EC tablet Take 325 mg by mouth daily.        . cholecalciferol (VITAMIN D) 1000 UNITS tablet Take 1 tablet (1,000 Units total) by mouth daily.  100 tablet  3  . diphenhydrAMINE (BENADRYL) 25 MG tablet Take 12.5-25 mg by mouth at bedtime as needed for sleep.      . fish oil-omega-3 fatty acids 1000 MG capsule Take 1 g by mouth 2 (two) times daily.        Marland Kitchen lisinopril (PRINIVIL,ZESTRIL) 40 MG tablet Take 1 tablet (40 mg total) by mouth daily.  90 tablet  0  . metoprolol (LOPRESSOR) 50 MG tablet Take 1 tablet (50 mg total) by mouth 2 (two) times daily.  180 tablet  3  . multivitamin (THERAGRAN) tablet Take 1 tablet by mouth daily.        . nitroGLYCERIN (NITROSTAT) 0.4 MG SL tablet Place 0.4 mg under the tongue every 5 (five) minutes as needed for chest pain (MAX 3 TABLETS).      Marland Kitchen simvastatin (ZOCOR) 40 MG tablet Take 1 tablet (40 mg total) by mouth at bedtime.  90 tablet  3  . tamsulosin (FLOMAX) 0.4 MG CAPS capsule Take 0.4 mg by mouth daily.       Marland Kitchen tiZANidine (ZANAFLEX) 4 MG tablet Take 1 tablet (4 mg total) by mouth at bedtime as needed for muscle spasms (cramps).  30 tablet  3   No current facility-administered medications for this visit.    Past Medical History  Diagnosis Date  . Aortic valve disorders   . Postsurgical aortocoronary bypass status   . Old myocardial infarction   . Peripheral vascular disease, unspecified   . Coronary atherosclerosis of unspecified type of vessel, native or graft   . Occlusion and stenosis of carotid artery without mention of cerebral infarction   .  Other and unspecified hyperlipidemia   . Cerebrovascular disease, unspecified   . Atherosclerosis of renal artery   . Postsurgical percutaneous transluminal coronary angioplasty status   . Scarlet fever   . Malignant neoplasm of bladder, part unspecified   . BPH (benign prostatic hypertrophy)   . Unspecified sleep apnea   . History of cystoscopy     w/ bladder biopsy  . Melanoma in situ of back 2012    area of left scapula    Past Surgical History  Procedure Laterality Date  . Cystoscopy      w/ bladder biopsy  . Percutaneous transluminal coronary angioplasty    . Coronary artery bypass graft    . Redo median sternotomy      extracorporeal circulation, aortic valve replacement using a 23-mm Mitroflow aortic pericardial heart valve.   Surgeon- Gilford Raid, MD 04-18-10  . Melanoma excision  2012    left scapular  area - clean margins.     Family History  Problem Relation Age of Onset  . Hypertension Mother   . Diabetes Mother   . Hyperlipidemia Mother   . Coronary artery disease Mother   . Heart disease Mother     CAD, cardiomyopathy  . Coronary artery disease Father   . Sudden death Father     1 wk post MI  . Heart attack Father   . Heart disease Father     CAD/MI-sudden death  . Cancer Other     prostate  . Birth defects Neg Hx     History   Social History  . Marital Status: Married    Spouse Name: N/A    Number of Children: 3  . 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 Smoking status: Never Smoker   . Smokeless tobacco: Never Used  . Alcohol Use: Yes     Comment: rare  . Drug Use: No  . Sexual Activity: Not Currently   Other Topics Concern  . Not on file   Social History Narrative   HSG, [place], did some graduate work. Married '52. 1 son - '61; 2 daughters -'12, '63: grandchildren 17; great-grands 88. 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 Active gardner, Designer, jewellery. good marriage.    ROS: See history of present illness otherwise negative  BP 150/80  Pulse 58  Ht 5\' 6"  (1.676 m)  Wt 148 lb 12.8 oz (67.495 kg)  BMI 24.03 kg/m2  PHYSICAL EXAM: Well-nournished, in no acute distress. Neck: Bilateral carotid bruits,  No JVD, HJR, or thyroid enlargement  Lungs: No tachypnea, clear without wheezing, rales, or rhonchi  Cardiovascular: RRR, PMI not displaced, positive S4 and 2/6 systolic murmur at the left sternal border, no bruit, thrill, or heave.  Abdomen: BS normal. Soft without organomegaly, masses, lesions or tenderness.  Extremities: without cyanosis, clubbing or edema. Good distal pulses bilateral  SKin: Warm, no lesions or rashes   Musculoskeletal: No deformities  Neuro: no focal  signs   Wt Readings from Last 3 Encounters:  04/15/14 148 lb 6.4 oz (67.314 kg)  03/11/14 148 lb (67.132 kg)  02/26/14 147 lb (66.679 kg)    Lab Results  Component Value Date   WBC 5.0 01/09/2011   HGB 12.6* 03/10/2013   HCT 37.3* 03/10/2013   PLT 125.0* 01/09/2011   GLUCOSE 98 04/29/2014   CHOL 101 03/10/2013   TRIG 44.0 03/10/2013   HDL 41.90  03/10/2013   LDLCALC 50 03/10/2013   ALT 26 03/11/2014   AST 31 03/11/2014   NA 140 04/29/2014   K 4.2 04/29/2014   CL 110 04/29/2014   CREATININE 1.4 04/29/2014   BUN 30* 04/29/2014   CO2 26 04/29/2014   TSH 2.80 03/11/2014   PSA 1.65 01/08/2009   INR 1.49 04/18/2010   HGBA1C 5.7 03/11/2014    Carotid Dopplers 05/04/14 stable 60-79% R. ICA stenosis LICA has decreased from prior exam and now in the 40-59% range. Followup in 1 year  Renal arteriogram duplex 04/29/14 Impressions Normal caliber abdominal aorta. Aorto-iliac atherosclerosis. >50% distal aorta stenosis. >70% proximal SMA stenosis. 1-59% bilateral renal artery stenosis by RAR criteria, velocities suggest >60% stenosis.  03/14/2010: Cardiac Cath Findings:  Assessment: 1. Coronary artery disease with all grafts patent. 2. Normal Left ventricular function with a highly calcified aortic valve. 3. Moderate aortic stenosis by catherterization. Severe by echo. 4. Peripheral arterial disease with apparent high-grade right renal artery stenosis.     2-D echo 02/10/14   Study Conclusions  - Left ventricle: The cavity size was normal. Wall thickness   was increased in a pattern of severe LVH. Systolic   function was normal. The estimated ejection fraction was   in the range of 55% to 60%. - Aortic valve: Tissue AVR appears normal with mildly   elevated gradients and no perivalvular leaks - Mitral valve: Moderate regurgitation. - Left atrium: The atrium was moderately dilated. - Atrial septum: No defect or patent foramen ovale was   identified.

## 2014-05-04 NOTE — Patient Instructions (Signed)
Your physician has recommended you make the following change in your medication:    START TAKING METOPROLOL 25 MG ONCE A DAY     START TAKING  HCTZ 25 MG ONCE A  DAY  REFERRAL TO DR BERRY FOR RENAL ARTERY STENOSIS    BMET IN 2 WEEKS ON November 2ND   Your physician has requested that you have a carotid duplex.IN ONE YEAR   This test is an ultrasound of the carotid arteries in your neck. It looks at blood flow through these arteries that supply the brain with blood. Allow one hour for this exam. There are no restrictions or special instructions.   Your physician recommends that you schedule a follow-up appointment in:  WITH DR Burt Knack IN 3 MONTHS

## 2014-05-04 NOTE — Progress Notes (Signed)
Carotid duplex performed 

## 2014-05-04 NOTE — Assessment & Plan Note (Signed)
Stable without chest pain 

## 2014-05-11 ENCOUNTER — Ambulatory Visit: Payer: Medicare Other | Admitting: Physician Assistant

## 2014-05-18 ENCOUNTER — Telehealth: Payer: Self-pay

## 2014-05-18 ENCOUNTER — Other Ambulatory Visit (INDEPENDENT_AMBULATORY_CARE_PROVIDER_SITE_OTHER): Payer: Medicare Other | Admitting: *Deleted

## 2014-05-18 DIAGNOSIS — R7989 Other specified abnormal findings of blood chemistry: Secondary | ICD-10-CM

## 2014-05-18 DIAGNOSIS — I701 Atherosclerosis of renal artery: Secondary | ICD-10-CM

## 2014-05-18 LAB — BASIC METABOLIC PANEL
BUN: 44 mg/dL — ABNORMAL HIGH (ref 6–23)
CHLORIDE: 109 meq/L (ref 96–112)
CO2: 26 meq/L (ref 19–32)
Calcium: 9 mg/dL (ref 8.4–10.5)
Creatinine, Ser: 1.8 mg/dL — ABNORMAL HIGH (ref 0.4–1.5)
GFR: 37.75 mL/min — ABNORMAL LOW (ref >60.00)
Glucose, Bld: 107 mg/dL — ABNORMAL HIGH (ref 70–99)
Potassium: 4.5 mEq/L (ref 3.5–5.1)
SODIUM: 140 meq/L (ref 135–145)

## 2014-05-18 NOTE — Telephone Encounter (Signed)
Patient scheduled for a Bmet and BP check on 05/27/2014.

## 2014-05-27 ENCOUNTER — Ambulatory Visit (INDEPENDENT_AMBULATORY_CARE_PROVIDER_SITE_OTHER): Payer: Medicare Other | Admitting: *Deleted

## 2014-05-27 ENCOUNTER — Other Ambulatory Visit (INDEPENDENT_AMBULATORY_CARE_PROVIDER_SITE_OTHER): Payer: Medicare Other | Admitting: *Deleted

## 2014-05-27 VITALS — BP 164/72 | HR 54 | Wt 147.0 lb

## 2014-05-27 DIAGNOSIS — I10 Essential (primary) hypertension: Secondary | ICD-10-CM

## 2014-05-27 DIAGNOSIS — I25799 Atherosclerosis of other coronary artery bypass graft(s) with unspecified angina pectoris: Secondary | ICD-10-CM

## 2014-05-27 DIAGNOSIS — I359 Nonrheumatic aortic valve disorder, unspecified: Secondary | ICD-10-CM

## 2014-05-27 DIAGNOSIS — I25709 Atherosclerosis of coronary artery bypass graft(s), unspecified, with unspecified angina pectoris: Secondary | ICD-10-CM

## 2014-05-27 LAB — BASIC METABOLIC PANEL
BUN: 31 mg/dL — AB (ref 6–23)
CO2: 26 mEq/L (ref 19–32)
Calcium: 9 mg/dL (ref 8.4–10.5)
Chloride: 110 mEq/L (ref 96–112)
Creatinine, Ser: 1.5 mg/dL (ref 0.4–1.5)
GFR: 46.06 mL/min — ABNORMAL LOW (ref >60.00)
GLUCOSE: 113 mg/dL — AB (ref 70–99)
POTASSIUM: 4.2 meq/L (ref 3.5–5.1)
Sodium: 142 mEq/L (ref 135–145)

## 2014-05-27 MED ORDER — AMLODIPINE BESYLATE 5 MG PO TABS: 5.0000 mg | ORAL_TABLET | Freq: Every day | ORAL | Status: DC

## 2014-05-27 MED ORDER — SIMVASTATIN 20 MG PO TABS: 20.0000 mg | ORAL_TABLET | Freq: Every day | ORAL | Status: DC

## 2014-05-27 NOTE — Patient Instructions (Signed)
Your physician has recommended you make the following change in your medication:   Start amlodipine 5 mg by mouth daily. Decrease simvastatin to 20 mg by mouth daily.

## 2014-05-27 NOTE — Progress Notes (Signed)
Pt here for blood pressure check. Also had lab work done today.  HCTZ stopped on 05/19/14 due to kidney function.  Pt reports blood pressure of 130-140/50's and pulse low 50's at home. Today blood pressure here is 164/72 and pulse 54.  Reviewed with Ignacia Bayley, PA and pt should start amlodipine 5 mg by mouth daily and decrease simvastatin to 20 mg by mouth daily.  Pt notified of instruction and given copy of updated after visit summary.  Prescription sent to [facility] on [address].

## 2014-06-10 ENCOUNTER — Ambulatory Visit: Payer: Medicare Other | Admitting: Cardiovascular Disease

## 2014-06-10 ENCOUNTER — Ambulatory Visit (INDEPENDENT_AMBULATORY_CARE_PROVIDER_SITE_OTHER): Payer: Medicare Other | Admitting: Cardiovascular Disease

## 2014-06-10 ENCOUNTER — Encounter: Payer: Self-pay | Admitting: Cardiovascular Disease

## 2014-06-10 VITALS — BP 136/70 | HR 62 | Ht 66.0 in | Wt 149.0 lb

## 2014-06-10 DIAGNOSIS — I739 Peripheral vascular disease, unspecified: Secondary | ICD-10-CM | POA: Diagnosis not present

## 2014-06-10 DIAGNOSIS — I701 Atherosclerosis of renal artery: Secondary | ICD-10-CM | POA: Diagnosis not present

## 2014-06-10 NOTE — Assessment & Plan Note (Signed)
History of renal artery stenosis documented by angiography performed by Dr. Albertine Patricia remotely at the time of stenting his left lower extremity. He has had significant hypertension and is currently on amlodipine 5 mg a day and lisinopril 40 mg a day as well as metoprolol 25 mg. His blood pressure today in the office is 136/70 and his last documented blood pressures over the last 2 visits have been in this range. He had renal Doppler studies performed 04/29/14 that suggested moderate renal artery stenosis with renal aortic ratios in the 3 range and kidneys that measured in the 10 cm range. His creatinine runs in the mid 1 range. At this point, given the fact that his blood pressure is under adequate control and that his Dopplers do not set suggest high-grade disease I prefer to watch him noninvasively.

## 2014-06-10 NOTE — Progress Notes (Signed)
06/10/2014 [name]   01-17-1929  [id number]  Primary Physician Walker Kehr, MD Primary Cardiologist: Lorretta Harp MD Renae Gloss   HPI:  Mr. Granlund is an 80 year old married Caucasian male, father of 23, grandfather and 7 grandchildren. He advised wife Molli Posey today who is a patient of mine. His primary care physician is Dr. Cristie Hem Plotnick cough. He was referred by Margarite Gouge, PA-C for evaluation of renal vascular hypertension. He has a history of coronary artery bypass grafting in 1995 as well as aortic valve replacement in 2011 with a pericardial valve. His other problems include hypertension and hyperlipidemia as well as peripheral arterial disease. He had angiography performed by Dr. Albertine Patricia 17 years ago with demonstration of renal artery stenosis and stenting of his left lower extremity. He had recent renal Doppler studies that suggested moderate renal artery stenosis. He is on 3 antihypertensive medications with blood pressures that run in the 140/70 range and creatinines around in the mid 1 range. He said carotid Dopplers that show moderate bilateral ICA stenosis. He denies chest pain or shortness of breath but does have claudication which she's noticed over the last 6 months.   Current Outpatient Prescriptions  Medication Sig Dispense Refill  . amLODipine (NORVASC) 5 MG tablet Take 1 tablet (5 mg total) by mouth daily. 90 tablet 3  . Ascorbic Acid (VITAMIN C PO) Take 1 capsule by mouth daily.    Marland Kitchen aspirin 325 MG EC tablet Take 325 mg by mouth daily.      . cholecalciferol (VITAMIN D) 1000 UNITS tablet Take 1 tablet (1,000 Units total) by mouth daily. 100 tablet 3  . diphenhydrAMINE (BENADRYL) 25 MG tablet Take 12.5-25 mg by mouth at bedtime as needed for sleep.    . fish oil-omega-3 fatty acids 1000 MG capsule Take 1 g by mouth 2 (two) times daily.     Marland Kitchen lisinopril (PRINIVIL,ZESTRIL) 40 MG tablet Take 1 tablet (40 mg total) by mouth daily. 90 tablet 0  .  metoprolol succinate (TOPROL XL) 25 MG 24 hr tablet Take 1 tablet (25 mg total) by mouth daily. 30 tablet 5  . multivitamin (THERAGRAN) tablet Take 1 tablet by mouth daily.      . nitroGLYCERIN (NITROSTAT) 0.4 MG SL tablet Place 0.4 mg under the tongue every 5 (five) minutes as needed for chest pain (MAX 3 TABLETS).    Marland Kitchen simvastatin (ZOCOR) 20 MG tablet Take 1 tablet (20 mg total) by mouth at bedtime. 90 tablet 3  . tamsulosin (FLOMAX) 0.4 MG CAPS capsule Take 0.4 mg by mouth daily.     Marland Kitchen tiZANidine (ZANAFLEX) 4 MG tablet Take 1 tablet (4 mg total) by mouth at bedtime as needed for muscle spasms (cramps). 30 tablet 3   No current facility-administered medications for this visit.    Allergies  Allergen Reactions  . Sulfonamide Derivatives Hives    History   Social History  . Marital Status: Married    Spouse Name: N/A    Number of Children: 3  . 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 Smoking status: Never Smoker   . Smokeless tobacco: Never Used  . Alcohol Use: Yes     Comment: rare  . Drug Use: No  . Sexual Activity: Not Currently   Other Topics Concern  . Not on file   Social History Narrative   HSG, [employer],  did some graduate work. Married '52. 1 son - '61; 2 daughters -'33, '63: grandchildren 69; great-grands 87. 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 Active gardner, Designer, jewellery. good marriage.     Review of Systems: General: negative for chills, fever, night sweats or weight changes.  Cardiovascular: negative for chest pain, dyspnea on exertion, edema, orthopnea, palpitations, paroxysmal nocturnal dyspnea or shortness of breath Dermatological: negative for rash Respiratory: negative for cough or wheezing Urologic: negative for hematuria Abdominal: negative for nausea, vomiting, diarrhea, bright red blood per rectum, melena, or hematemesis Neurologic: negative for visual changes,  syncope, or dizziness All other systems reviewed and are otherwise negative except as noted above.    Blood pressure 136/70, pulse 62, height 5\' 6"  (1.676 m), weight 149 lb (67.586 kg).  General appearance: alert and no distress Neck: no adenopathy, no JVD, supple, symmetrical, trachea midline, thyroid not enlarged, symmetric, no tenderness/mass/nodules and soft bilateral carotid bruits Lungs: clear to auscultation bilaterally Heart: 2/6 outflow tract murmur consistent with aortic stenosis Extremities: extremities normal, atraumatic, no cyanosis or edema and 2+ femoral pulses with soft bruits bilaterally, 2+ pedal pulses  EKG not performed today  ASSESSMENT AND PLAN:   RENAL ARTERY STENOSIS History of renal artery stenosis documented by angiography performed by Dr. Albertine Patricia remotely at the time of stenting his left lower extremity. He has had significant hypertension and is currently on amlodipine 5 mg a day and lisinopril 40 mg a day as well as metoprolol 25 mg. His blood pressure today in the office is 136/70 and his last documented blood pressures over the last 2 visits have been in this range. He had renal Doppler studies performed 04/29/14 that suggested moderate renal artery stenosis with renal aortic ratios in the 3 range and kidneys that measured in the 10 cm range. His creatinine runs in the mid 1 range. At this point, given the fact that his blood pressure is under adequate control and that his Dopplers do not set suggest high-grade disease I prefer to watch him noninvasively.  Peripheral vascular disease History of peripheral arterial disease status post left lower extremity stenting by Dr. Albertine Patricia in 7-10 years ago. He enjoyed relief from claudication for some time after that but has noticed increasing claudication over the last 6 months. I'm going to get lower extremity arterial Doppler studies to demonstrate whether or not he developed progressive arterial  insufficiency.      Lorretta Harp MD FACP,FACC,FAHA, [facility] 06/10/2014 12:23 PM

## 2014-06-10 NOTE — Assessment & Plan Note (Signed)
History of peripheral arterial disease status post left lower extremity stenting by Dr. Albertine Patricia in 7-10 years ago. He enjoyed relief from claudication for some time after that but has noticed increasing claudication over the last 6 months. I'm going to get lower extremity arterial Doppler studies to demonstrate whether or not he developed progressive arterial insufficiency.

## 2014-06-10 NOTE — Patient Instructions (Signed)
  We will see you back in follow up with Dr Gwenlyn Found only as needed.   Dr Gwenlyn Found has ordered: lower extremity arterial doppler- During this test, ultrasound is used to evaluate arterial blood flow in the legs. Allow approximately one hour for this exam.   In 6 months from now: renal artery duplex- During this test, an ultrasound is used to evaluate blood flow to the kidneys. Allow one hour for this exam. Do not eat after midnight the day before and avoid carbonated beverages. Take your medications as you usually do.

## 2014-06-15 ENCOUNTER — Ambulatory Visit: Payer: Medicare Other | Admitting: Physician Assistant

## 2014-06-26 ENCOUNTER — Ambulatory Visit (HOSPITAL_COMMUNITY)
Admission: RE | Admit: 2014-06-26 | Discharge: 2014-06-26 | Disposition: A | Discharge status: Home / Self Care | Payer: Medicare Other | Source: Ambulatory Visit | Attending: Cardiology | Admitting: Cardiology

## 2014-06-26 DIAGNOSIS — I739 Peripheral vascular disease, unspecified: Secondary | ICD-10-CM | POA: Insufficient documentation

## 2014-06-26 NOTE — Progress Notes (Signed)
Arterial Lower Ext. Duplex Completed. Dellis Voght, BS, RDMS, RVT  

## 2014-07-04 ENCOUNTER — Encounter: Payer: Self-pay | Admitting: Cardiovascular Disease

## 2014-07-07 ENCOUNTER — Other Ambulatory Visit: Payer: Self-pay | Admitting: Cardiovascular Disease

## 2014-07-27 ENCOUNTER — Ambulatory Visit (HOSPITAL_COMMUNITY): Payer: Medicare Other | Attending: Cardiovascular Disease | Admitting: Radiology

## 2014-07-27 DIAGNOSIS — E039 Hypothyroidism, unspecified: Secondary | ICD-10-CM | POA: Diagnosis not present

## 2014-07-27 DIAGNOSIS — I340 Nonrheumatic mitral (valve) insufficiency: Secondary | ICD-10-CM | POA: Diagnosis not present

## 2014-07-27 DIAGNOSIS — I350 Nonrheumatic aortic (valve) stenosis: Secondary | ICD-10-CM | POA: Insufficient documentation

## 2014-07-27 DIAGNOSIS — G4730 Sleep apnea, unspecified: Secondary | ICD-10-CM | POA: Diagnosis not present

## 2014-07-27 DIAGNOSIS — E785 Hyperlipidemia, unspecified: Secondary | ICD-10-CM | POA: Diagnosis not present

## 2014-07-27 DIAGNOSIS — R001 Bradycardia, unspecified: Secondary | ICD-10-CM | POA: Diagnosis not present

## 2014-07-27 DIAGNOSIS — I701 Atherosclerosis of renal artery: Secondary | ICD-10-CM | POA: Insufficient documentation

## 2014-07-27 DIAGNOSIS — I359 Nonrheumatic aortic valve disorder, unspecified: Secondary | ICD-10-CM

## 2014-07-27 DIAGNOSIS — I10 Essential (primary) hypertension: Secondary | ICD-10-CM | POA: Insufficient documentation

## 2014-07-27 DIAGNOSIS — I252 Old myocardial infarction: Secondary | ICD-10-CM | POA: Diagnosis not present

## 2014-07-27 DIAGNOSIS — Z952 Presence of prosthetic heart valve: Secondary | ICD-10-CM | POA: Diagnosis not present

## 2014-07-27 DIAGNOSIS — I2510 Atherosclerotic heart disease of native coronary artery without angina pectoris: Secondary | ICD-10-CM

## 2014-07-27 DIAGNOSIS — I4510 Unspecified right bundle-branch block: Secondary | ICD-10-CM | POA: Diagnosis not present

## 2014-07-27 NOTE — Progress Notes (Signed)
Echocardiogram performed.  

## 2014-08-24 ENCOUNTER — Encounter: Payer: Self-pay | Admitting: Internal Medicine

## 2014-08-24 ENCOUNTER — Ambulatory Visit (INDEPENDENT_AMBULATORY_CARE_PROVIDER_SITE_OTHER): Payer: Medicare Other | Admitting: Internal Medicine

## 2014-08-24 VITALS — BP 140/72 | HR 69 | Temp 98.1°F | Ht 66.0 in | Wt 151.0 lb

## 2014-08-24 DIAGNOSIS — S8012XA Contusion of left lower leg, initial encounter: Secondary | ICD-10-CM

## 2014-08-24 MED ORDER — NITROGLYCERIN 0.4 MG SL SUBL: 0.4000 mg | SUBLINGUAL_TABLET | SUBLINGUAL | Status: DC | PRN

## 2014-08-24 NOTE — Progress Notes (Signed)
Pre visit review using our clinic review tool, if applicable. No additional management support is needed unless otherwise documented below in the visit note. 

## 2014-08-24 NOTE — Patient Instructions (Signed)
Use warm moist compresses @ least 4 times a day to the affected area. Please report warning signs as we discussed. Worrisome would be red streaks up the extremity, increased pain, fever, or pus production.

## 2014-08-24 NOTE — Progress Notes (Signed)
   Subjective:    Patient ID: [name], male    DOB: [birth year], 81 y.o.   MRN: [id number]  HPI  Approximately 2 weeks ago he struck his L shin on the tongue of a trailer. Initially there was dramatic ecchymosis present but this discoloration has essentially resolved. It now appears to be more diffuse and is slightly tender. He describes a dull discomfort up to level IV. He has been using ice on this.  Significantly he had a stent in the left upper extremity 7 years ago.  He came in because he was concerned of some swelling and redness in this area.. There has been no associated purulence, fever, chills, or sweats.  He denies any active cardiopulmonary symptoms.  Review of Systems Chest pain, palpitations, tachycardia, exertional dyspnea, paroxysmal nocturnal dyspnea, claudication or edema are absent. One episode of epistaxis recently which resolved w/o complication. Hemoptysis, hematuria, melena, or rectal bleeding denied. No unexplained weight loss, significant dyspepsia,dysphagia, or abdominal pain.  There is no abnormal bruising , bleeding, or difficulty stopping bleeding with injury.     Objective:   Physical Exam  Pertinent or positive findings include: There is splitting the first heart sound noted in the right carotid He has a grade 1. 5-2 systolic murmur. An 8.5 x 4.5 cm slightly fluctuant hematoma is found over the left mid shin. Scattered chronic ecchymotic changes over the forearms. Homans sign is negative.  General appearance :adequately nourished; in no distress. Eyes: No conjunctival inflammation or scleral icterus is present. Heart:  Normal rate and regular rhythm. S1 and S2 normal without gallop,click,or  rub  Lungs:Chest clear to auscultation; no wheezes, rhonchi,rales ,or rubs present.No increased work of breathing.  Abdomen: bowel sounds normal, soft and non-tender without masses, organomegaly or hernias noted.  No guarding or rebound.  Vascular : all  pulses equal ; no bruits present. Skin:Warm & dry.  Intact without suspicious lesions or rashes ; no jaundice or tenting Lymphatic: No lymphadenopathy is noted about the head, neck, axilla Neuro: Strength, tone normal.         Assessment & Plan:  #1 post traumatic hematoma left shin; no evidence clinically of cellulitis  Plan: See after visit summary

## 2014-09-01 ENCOUNTER — Ambulatory Visit (INDEPENDENT_AMBULATORY_CARE_PROVIDER_SITE_OTHER): Payer: Medicare Other | Admitting: Internal Medicine

## 2014-09-01 ENCOUNTER — Encounter: Payer: Self-pay | Admitting: Internal Medicine

## 2014-09-01 ENCOUNTER — Telehealth: Payer: Self-pay | Admitting: Internal Medicine

## 2014-09-01 ENCOUNTER — Other Ambulatory Visit: Payer: Medicare Other

## 2014-09-01 VITALS — BP 140/70 | HR 61 | Temp 98.3°F | Ht 66.0 in | Wt 154.0 lb

## 2014-09-01 DIAGNOSIS — R600 Localized edema: Secondary | ICD-10-CM

## 2014-09-01 DIAGNOSIS — S8012XS Contusion of left lower leg, sequela: Secondary | ICD-10-CM

## 2014-09-01 NOTE — Progress Notes (Signed)
   Subjective:    Patient ID: [name], male    DOB: March 31, 1929, 81 y.o.   MRN: [id number]  HPI He noticed edema of the left ankle as of today. This is in the  context of a posttraumatic hematoma over the left shin when he struck his left leg against a trailer approximately 4 weeks ago. He does not believe that the hematoma is resolving. He did not appreciate edema until today. His also noted some faint ecchymosis over the dorsum of left foot as of today.  Other than the edema he says no other cardiovascular symptoms. He's had no fever, chills, or sweats. This been no purulence or tenderness  associated with the hematoma.  No PMH of DVT or PTE. PMH of bladder cancer and significant CAD & valvular disease as per Problem List   Review of Systems Chest pain, palpitations, tachycardia, exertional dyspnea, paroxysmal nocturnal dyspnea,or  claudication  are absent.      Objective:   Physical Exam  Positive or pertinent findings include: He has rumbling carotid bruits. He has a grade 8-2.9 systolic murmur at the base. There is equivocal Homans on the left which he describes a soreness with compression of the calf and flexion of the ankle. He is 1+ pedal edema on the left. He has a 4.5 x 4.75 cm slightly fluctuant, slightly erythematous mass over the left mid shin. It blanches minimally with pressure. There is no evidence of pustule formation or associated cellulitis. There is faint ecchymosis of the dorsum of the left foot.  All toenails are markedly deformed.  General appearance :Thin but adequately nourished; in no distress.Pattern alopecia Eyes: No conjunctival inflammation or scleral icterus is present. Heart:  Normal rate and regular rhythm. S1 and S2 normal without gallop,  click, rub or other extra sounds   Lungs:Chest clear to auscultation; no wheezes, rhonchi,rales ,or rubs present.No increased work of breathing.  Abdomen: bowel sounds normal, soft and non-tender without  masses, organomegaly or hernias noted.  No guarding or rebound.  Vascular : all pulses equal  Skin:Warm & dry.  Intact without suspicious  rashes ; no jaundice or tenting Lymphatic: No lymphadenopathy is noted about the head, neck, axilla Neuro: Strength, tone & DTRs normal.        Assessment & Plan:  #1 edema left lower extremity  #2 equivocally +  Homans sign LLE  #3 non-resolving hematoma left shin  Plan: D-dimer; initiation of Xarelto and discontinuation of aspirin if his d-dimer is positive until a venous Doppler can be completed  If the d-dimer is negative;he'll be referred to general surgeon to incise and drain the hematoma.

## 2014-09-01 NOTE — Progress Notes (Signed)
Pre visit review using our clinic review tool, if applicable. No additional management support is needed unless otherwise documented below in the visit note. 

## 2014-09-01 NOTE — Telephone Encounter (Signed)
Patient Name: HULEN MANDLER DOB: [birth year] Initial Comment Caller States husband hurt leg few weeks ago and it's not better , foot and ankle swelling Nurse Assessment Nurse: Marcelline Deist, RN, Lynda Date/Time (Eastern Time): 09/01/2014 12:19:38 PM Confirm and document reason for call. If symptomatic, describe symptoms. ---Caller states he hurt his left leg few weeks ago and it's not better ,foot and ankle swelling. Seen last week for hematoma, told to use warm compresses. The area below the wound is swelling. Has PAD, hx of bypass surgery. Has soreness at wound. No fever. Has the patient traveled out of the country within the last 30 days? ---Not Applicable Does the patient require triage? ---Yes Related visit to physician within the last 2 weeks? ---Yes Does the PT have any chronic conditions? (i.e. diabetes, asthma, etc.) ---Yes List chronic conditions. ---valve replacement, bypass surgery, stent in leg, early PAD, BP rx Guidelines Guideline Title Affirmed Question Affirmed Notes Leg Injury [1] Large swelling or bruise AND [2] size > palm of person's hand Final Disposition User See Physician within [place], RN, [employer] has already scheduled an appt. today with his Dr. to have this looked at. Advised to try to limit activity involving that leg & to elevate as much as possible until he can be seen.

## 2014-09-01 NOTE — Patient Instructions (Addendum)
If the screening test test for clots is positive; the Xarelto will be started twice a day & the aspirin discontinued. As you have not taken aspirin today;take one of the sample pills tonight but no more until we get the results back on the D-dimer  Additionally a venous Doppler will be pursued if D dimer positive.   If this test is negative; referral be made to the general surgeon to evacuate the slowly resolving hematoma of the left shin.  Xarelto 15 mg  ,Lot [id number];IO 9/17;#14

## 2014-09-02 ENCOUNTER — Telehealth: Payer: Self-pay

## 2014-09-02 ENCOUNTER — Other Ambulatory Visit: Payer: Self-pay | Admitting: Internal Medicine

## 2014-09-02 ENCOUNTER — Ambulatory Visit (HOSPITAL_COMMUNITY): Payer: Medicare Other | Attending: Internal Medicine | Admitting: Cardiology

## 2014-09-02 ENCOUNTER — Telehealth: Payer: Self-pay | Admitting: *Deleted

## 2014-09-02 DIAGNOSIS — R609 Edema, unspecified: Secondary | ICD-10-CM | POA: Insufficient documentation

## 2014-09-02 DIAGNOSIS — R7989 Other specified abnormal findings of blood chemistry: Secondary | ICD-10-CM

## 2014-09-02 DIAGNOSIS — M7989 Other specified soft tissue disorders: Secondary | ICD-10-CM

## 2014-09-02 DIAGNOSIS — S8012XD Contusion of left lower leg, subsequent encounter: Secondary | ICD-10-CM

## 2014-09-02 LAB — D-DIMER, QUANTITATIVE: D-Dimer, Quant: 0.66 ug/mL-FEU — ABNORMAL HIGH (ref 0.00–0.48)

## 2014-09-02 NOTE — Telephone Encounter (Signed)
Patient has been advised of Dr [employer] [facility] message.

## 2014-09-02 NOTE — Progress Notes (Signed)
Left lower venous duplex performed  

## 2014-09-02 NOTE — Telephone Encounter (Signed)
-----   Message from Hendricks Limes, MD sent at 09/02/2014  2:58 PM EST ----- Excellent ; the Venous Doppler reveals no clots in legs.This test was necessary due to the elevated D dimer , a screening but NOT diagnostic test for leg clots. STOP Xarelto; resume aspirin tomorrow @ least 24 hours after Xarelto. The Surgery referral to drain the hematoma will be scheduled and you'll be notified of the time.Please call the Referral Co-Ordinator @ [phone] if you have not been notified of appointment time within 7-10 days.

## 2014-09-02 NOTE — Telephone Encounter (Signed)
[facility] - Client [facility] Patient Name: [name] Gender: Male DOB: 1928/09/30 Age: 81 Y 67 M 10 D Return Phone Number: [phone] (Primary) Address: [place]/State/Zip: [place] [address] Client [facility] - Client Client Site [place] - Day Physician Unice Cobble Contact Type Call Call Type Triage / [place] Name Molli Posey Wainer Relationship To Patient Spouse Appointment Disposition EMR Patient Reports Appointment Already Scheduled Return Phone Number [phone] (Primary) Chief Complaint Leg Injury Initial Comment Caller States husband hurt leg few weeks ago and it's not better , foot and ankle swelling PreDisposition Call Doctor Info pasted into Epic Yes Nurse Assessment Nurse: Marcelline Deist, RN, Lynda Date/Time (Eastern Time): 09/01/2014 12:19:38 PM Confirm and document reason for call. If symptomatic, describe symptoms. ---Caller states he hurt his left leg few weeks ago and it's not better ,foot and ankle swelling. Seen last week for hematoma, told to use warm compresses. The area below the wound is swelling. Has PAD, hx of bypass surgery. Has soreness at wound. No fever. Has the patient traveled out of the country within the last 30 days? ---Not Applicable Does the patient require triage? ---Yes Related visit to physician within the last 2 weeks? ---Yes Does the PT have any chronic conditions? (i.e. diabetes, asthma, etc.) ---Yes List chronic conditions. ---valve replacement, bypass surgery, stent in leg, early PAD, BP rx Guidelines Guideline Title Affirmed Question Affirmed Notes Nurse Date/Time (Eastern Time) Leg Injury [1] Large swelling or bruise AND [2] size > palm of person's hand Marcelline Deist, RN, [name] 09/01/2014 12:24:10 PM Disp. Time Eilene Ghazi Time) Disposition Final User 09/01/2014 12:28:32 PM See Physician within 24  Hours Yes Marcelline Deist, RN, [name] PLEASE NOTE: All timestamps contained within this report are represented as [place] Standard Time. CONFIDENTIALTY NOTICE: This fax transmission is intended only for the addressee. It contains information that is legally privileged, confidential or otherwise protected from use or disclosure. If you are not the intended recipient, you are strictly prohibited from reviewing, disclosing, copying using or disseminating any of this information or taking any action in reliance on or regarding this information. If you have received this fax in error, please notify us immediately by telephone so that we can arrange for its return to [place]. Phone: [phone], Toll-Free: [phone], Fax: [phone] Page: 2 of 2 Call Id: [id number] Caller Understands: Yes Disagree/Comply: Comply Care Advice Given Per Guideline SEE PHYSICIAN WITHIN 24 HOURS: * IF OFFICE WILL BE OPEN: You need to be examined within the next 24 hours. Call your doctor when the office opens, and make an appointment. CALL BACK IF: * Pain becomes severe * You become worse. CARE ADVICE given per Leg Injury (Adult) guideline. After Care Instructions Given Call Event Type User Date / Time Description Comments User: Donald Siva, RN Date/Time Eilene Ghazi Time): 09/01/2014 12:29:57 PM Caller has already scheduled an appt. today with his Dr. to have this looked at. Advised to try to limit activity involving that leg & to elevate as much as possible until he can be seen. Referrals REFERRED TO PCP OFFICE

## 2014-09-28 ENCOUNTER — Other Ambulatory Visit: Payer: Self-pay | Admitting: Internal Medicine

## 2014-09-28 DIAGNOSIS — S8010XD Contusion of unspecified lower leg, subsequent encounter: Secondary | ICD-10-CM

## 2014-09-28 NOTE — Progress Notes (Signed)
Pt states his issue has resolved itself and no longer needs referral. Appt @ [facility] has been canceled.

## 2014-10-17 ENCOUNTER — Other Ambulatory Visit: Payer: Self-pay | Admitting: Cardiovascular Disease

## 2014-10-23 ENCOUNTER — Telehealth: Payer: Self-pay

## 2014-10-23 MED ORDER — ZOLPIDEM TARTRATE 5 MG PO TABS: 5.0000 mg | ORAL_TABLET | Freq: Every evening | ORAL | Status: DC | PRN

## 2014-10-23 NOTE — Telephone Encounter (Signed)
OK Zolpidem 5 mg qhs prn #30 and 3 ref. Sch OV w/me Thx

## 2014-10-23 NOTE — Telephone Encounter (Signed)
Zolpidem has been called to [facility]

## 2014-10-23 NOTE — Telephone Encounter (Signed)
Received a refill request from Kristopher Oppenheim at [place] [facility] requesting a refill on Zolpidem 10 mg.  I do not see this listed on patient's chart. You are his pcp. Please advise. If okay what directions and qty?

## 2014-11-30 ENCOUNTER — Other Ambulatory Visit: Payer: Self-pay | Admitting: Physician Assistant

## 2015-02-18 ENCOUNTER — Other Ambulatory Visit: Payer: Self-pay | Admitting: Physician Assistant

## 2015-03-25 ENCOUNTER — Ambulatory Visit (INDEPENDENT_AMBULATORY_CARE_PROVIDER_SITE_OTHER): Payer: Medicare Other

## 2015-03-25 DIAGNOSIS — Z23 Encounter for immunization: Secondary | ICD-10-CM

## 2015-03-28 ENCOUNTER — Other Ambulatory Visit: Payer: Self-pay | Admitting: Cardiovascular Disease

## 2015-04-12 ENCOUNTER — Other Ambulatory Visit: Payer: Self-pay | Admitting: Cardiovascular Disease

## 2015-05-05 ENCOUNTER — Other Ambulatory Visit: Payer: Self-pay | Admitting: Cardiovascular Disease

## 2015-05-05 DIAGNOSIS — I6523 Occlusion and stenosis of bilateral carotid arteries: Secondary | ICD-10-CM

## 2015-05-12 ENCOUNTER — Ambulatory Visit (HOSPITAL_COMMUNITY)
Admission: RE | Admit: 2015-05-12 | Discharge: 2015-05-12 | Disposition: A | Discharge status: Home / Self Care | Payer: Medicare Other | Source: Ambulatory Visit | Attending: Cardiovascular Disease | Admitting: Cardiovascular Disease

## 2015-05-12 DIAGNOSIS — I6523 Occlusion and stenosis of bilateral carotid arteries: Secondary | ICD-10-CM

## 2015-05-12 DIAGNOSIS — I739 Peripheral vascular disease, unspecified: Secondary | ICD-10-CM | POA: Diagnosis not present

## 2015-05-17 ENCOUNTER — Other Ambulatory Visit: Payer: Self-pay | Admitting: Nurse Practitioner

## 2015-05-21 ENCOUNTER — Encounter: Payer: Self-pay | Admitting: Cardiovascular Disease

## 2015-05-21 ENCOUNTER — Ambulatory Visit (INDEPENDENT_AMBULATORY_CARE_PROVIDER_SITE_OTHER): Payer: Medicare Other | Admitting: Cardiovascular Disease

## 2015-05-21 VITALS — BP 160/60 | HR 59 | Ht 66.0 in | Wt 147.0 lb

## 2015-05-21 DIAGNOSIS — I25709 Atherosclerosis of coronary artery bypass graft(s), unspecified, with unspecified angina pectoris: Secondary | ICD-10-CM

## 2015-05-21 DIAGNOSIS — I10 Essential (primary) hypertension: Secondary | ICD-10-CM

## 2015-05-21 DIAGNOSIS — I359 Nonrheumatic aortic valve disorder, unspecified: Secondary | ICD-10-CM | POA: Diagnosis not present

## 2015-05-21 DIAGNOSIS — I25799 Atherosclerosis of other coronary artery bypass graft(s) with unspecified angina pectoris: Secondary | ICD-10-CM | POA: Diagnosis not present

## 2015-05-21 MED ORDER — LISINOPRIL 40 MG PO TABS: ORAL_TABLET | ORAL | Status: DC

## 2015-05-21 MED ORDER — SIMVASTATIN 20 MG PO TABS: 20.0000 mg | ORAL_TABLET | Freq: Every day | ORAL | Status: DC

## 2015-05-21 MED ORDER — METOPROLOL SUCCINATE ER 25 MG PO TB24: ORAL_TABLET | ORAL | Status: DC

## 2015-05-21 MED ORDER — AMLODIPINE BESYLATE 10 MG PO TABS: 10.0000 mg | ORAL_TABLET | Freq: Every day | ORAL | Status: DC

## 2015-05-21 NOTE — Patient Instructions (Signed)
Medication Instructions:  Your physician has recommended you make the following change in your medication:  1. INCREASE Amlodipine to 10mg  take one tablet by mouth daily  Labwork: No new orders.   Testing/Procedures: Your physician has requested that you have an echocardiogram in 6 MONTHS. Echocardiography is a painless test that uses sound waves to create images of your heart. It provides your doctor with information about the size and shape of your heart and how well your heart's chambers and valves are working. This procedure takes approximately one hour. There are no restrictions for this procedure.  Follow-Up: Your physician wants you to follow-up in: 6 MONTHS with Dr Burt Knack.  You will receive a reminder letter in the mail two months in advance. If you don't receive a letter, please call our office to schedule the follow-up appointment.  Any Other Special Instructions Will Be Listed Below (If Applicable).     If you need a refill on your cardiac medications before your next appointment, please call your pharmacy.

## 2015-05-21 NOTE — Progress Notes (Signed)
Cardiology Office Note Date:  05/23/2015   ID:  [name], DOB 02-22-1929, MRN [id number]  PCP:  Walker Kehr, MD  Cardiologist:  Sherren Mocha, MD    Chief Complaint  Patient presents with  . aortic valve disease    History of Present Illness: [name] is a 81 y.o. male who presents for follow-up evaluation. The patient has aortic valve disease status post aortic valve replacement. He has been noted to have severe left ventricular hypertrophy. The patient underwent multivessel CABG in 1995. He then went on to develop severe symptomatic aortic stenosis. He underwent aortic valve replacement in 2011 with a 23 mm mitroflow aortic pericardial heart valve. His most recent echocardiogram in January 2016 demonstrated moderate bioprosthetic valve stenosis with a mean transvalvular gradient of 34 mmHg.  The patient complains of shortness of breath when he is bending over to do work. He has developed some balance problems. He is still able to work in his yard. 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 The patient denies chest pain or pressure. He does have some dizziness. He denies orthopnea, PND, or leg swelling. He has about 2 episodes a year where he develops shortness of breath and some anxiety. These tend to last for 1-2 days and then resolve on their own.   Past Medical History  Diagnosis Date  . Aortic valve disorders   . Postsurgical aortocoronary bypass status   . Old myocardial infarction   . Peripheral vascular disease, unspecified ([place])     history of stent left lower extremity placed by Dr. Albertine Patricia  . Coronary atherosclerosis of unspecified type of vessel, native or graft   . Occlusion and stenosis of carotid artery without mention of cerebral infarction   . Other and unspecified hyperlipidemia   . Cerebrovascular disease, unspecified   . Atherosclerosis of renal artery ([place])   . Postsurgical percutaneous transluminal coronary angioplasty status    . Scarlet fever   . Malignant neoplasm of bladder, part unspecified   . BPH (benign prostatic hypertrophy)   . Unspecified sleep apnea   . History of cystoscopy     w/ bladder biopsy  . Melanoma in situ of back ([place]) 2012    area of left scapula  . Bilateral renal artery stenosis ([facility])     documented at the time of angiography by Dr. Albertine Patricia    Past Surgical History  Procedure Laterality Date  . Cystoscopy      w/ bladder biopsy  . Percutaneous transluminal coronary angioplasty    . Coronary artery bypass graft    . Redo median sternotomy      extracorporeal circulation, aortic valve replacement using a 23-mm Mitroflow aortic pericardial heart valve.  Surgeon- Gilford Raid, MD 04-18-10  . Melanoma excision  2012    left scapular  area - clean margins.     Current Outpatient Prescriptions  Medication Sig Dispense Refill  . amLODipine (NORVASC) 10 MG tablet Take 1 tablet (10 mg total) by mouth daily. 90 tablet 3  . Ascorbic Acid (VITAMIN C PO) Take 1 capsule by mouth daily.    Marland Kitchen aspirin 325 MG EC tablet Take 325 mg by mouth daily.      . B Complex-C (SUPER B COMPLEX PO) Take 1 tablet by mouth daily.    . cholecalciferol (VITAMIN D) 1000 UNITS tablet Take 1,000 Units by mouth daily.    . diphenhydrAMINE (BENADRYL) 25 MG tablet Take 12.5-25 mg by mouth at bedtime as  needed for sleep.    . fish oil-omega-3 fatty acids 1000 MG capsule Take 1 g by mouth 2 (two) times daily.     Marland Kitchen lisinopril (PRINIVIL,ZESTRIL) 40 MG tablet TAKE 1 TABLET (40 MG TOTAL) BY MOUTH DAILY. 90 tablet 3  . Magnesium 500 MG CAPS Take 1 capsule by mouth daily.    . metoprolol succinate (TOPROL-XL) 25 MG 24 hr tablet TAKE 1 TABLET (25 MG TOTAL) BY MOUTH DAILY. 90 tablet 3  . multivitamin (THERAGRAN) tablet Take 1 tablet by mouth daily.      . nitroGLYCERIN (NITROSTAT) 0.4 MG SL tablet Place 1 tablet (0.4 mg total) under the tongue every 5 (five) minutes as needed for chest pain (MAX 3 TABLETS). 25 tablet 0  .  RAPAFLO 8 MG CAPS capsule   1  . simvastatin (ZOCOR) 20 MG tablet Take 1 tablet (20 mg total) by mouth at bedtime. 90 tablet 3  . tiZANidine (ZANAFLEX) 4 MG tablet Take 1 tablet (4 mg total) by mouth at bedtime as needed for muscle spasms (cramps). 30 tablet 3  . zolpidem (AMBIEN) 5 MG tablet Take 1 tablet (5 mg total) by mouth at bedtime as needed for sleep. 30 tablet 3   No current facility-administered medications for this visit.    Allergies:   Sulfonamide derivatives   Social History:  The patient  reports that he has never smoked. He has never used smokeless tobacco. He reports that he drinks alcohol. He reports that he does not use illicit drugs.   Family History:  The patient's  family history includes Cancer in his other; Coronary artery disease in his father and mother; Diabetes in his mother; Heart attack in his father; Heart disease in his father and mother; Hyperlipidemia in his mother; Hypertension in his mother; Stroke in his mother; Sudden death in his father. There is no history of Birth defects.    ROS:  Please see the history of present illness.  Otherwise, review of systems is positive for hearing loss, easy bruising, leg pain, anxiety, benefits problems, bleeding.  All other systems are reviewed and negative.    PHYSICAL EXAM: VS:  BP 160/60 mmHg  Pulse 59  Ht 5\' 6"  (1.676 m)  Wt 147 lb (66.679 kg)  BMI 23.74 kg/m2 , BMI Body mass index is 23.74 kg/(m^2). GEN: Well nourished, well developed, in no acute distress HEENT: normal Neck: no JVD, no masses. Bilateral carotid bruits Cardiac: RRR grade 2/6 systolic ejection murmur heard at the left lower sternal border and right upper sternal border, A2 is easily audible              Respiratory:  clear to auscultation bilaterally, normal work of breathing GI: soft, nontender, nondistended, + BS MS: no deformity or atrophy Ext: no pretibial edema, pedal pulses 2+= bilaterally Skin: warm and dry, no rash Neuro:  Strength  and sensation are intact Psych: euthymic mood, full affect  EKG:  EKG is ordered today. The ekg ordered today shows sinus rhythm 59 bpm, right bundle branch block, anteroseptal infarct age undetermined, PVCs  Recent Labs: 05/27/2014: BUN 31*; Creatinine, Ser 1.5; Potassium 4.2; Sodium 142   Lipid Panel     Component Value Date/Time   CHOL 101 03/10/2013 1430   TRIG 44.0 03/10/2013 1430   HDL 41.90 03/10/2013 1430   CHOLHDL 2 03/10/2013 1430   VLDL 8.8 03/10/2013 1430   LDLCALC 50 03/10/2013 1430      Wt Readings from Last 3 Encounters:  05/21/15  147 lb (66.679 kg)  09/01/14 154 lb (69.854 kg)  08/24/14 151 lb (68.493 kg)     Cardiac Studies Reviewed: 2D Echo 07-27-2014: Study Conclusions  - Left ventricle: The cavity size was normal. Wall thickness was increased in a pattern of mild LVH. Systolic function was normal. The estimated ejection fraction was in the range of 60% to 65%. Wall motion was normal; there were no regional wall motion abnormalities. Doppler parameters are consistent with abnormal left ventricular relaxation (grade 1 diastolic dysfunction). - Aortic valve: There was a bioprosthetic aortic valve. There was no regurgitation. Moderately increased gradient across the bioprosthetic aortic valve. Valve opening appeared restricted. Mean gradient (S): 34 mm Hg. - Mitral valve: Mildly calcified annulus. There was mild regurgitation. - Left atrium: The atrium was mildly dilated. - Right ventricle: The cavity size was normal. Systolic function was normal. - Pulmonary arteries: No complete TR doppler jet so unable to estimate PA systolic pressure. - Inferior vena cava: The vessel was normal in size. The respirophasic diameter changes were in the normal range (>= 50%), consistent with normal central venous pressure.  Impressions:  - Normal LV size with mild LV hypertrophy. EF 60-65%. Normal RV size and systolic function.  Bioprosthetic aortic valve with leaflet restriction noted. Moderately increased mean gradient of 34 mmHg. Mild MR.   ASSESSMENT AND PLAN: 1.  Aortic valve disease status post bioprosthetic aortic valve replacement with moderate aortic stenosis: New [place] Heart Association functional class II symptoms. Most recent echo reviewed in detail. Mitroflow valves have been associated with earlier valve degeneration compared with other bioprosthetic valves. Will repeat an echocardiogram in 6 months prior to his return office visit.  2. Essential hypertension, suboptimal control: Recommend increase amlodipine to 10 mg daily. Continue metoprolol succinate.  3. Coronary artery disease, native vessel, without angina: The patient is stable on his current medications which include aspirin, a statin drug, and a beta blocker.  Current medicines are reviewed with the patient today.  The patient has concerns regarding medicines.  Labs/ tests ordered today include:   Orders Placed This Encounter  Procedures  . EKG 12-Lead  . Echocardiogram    Disposition:   FU 6 months with an echo  Signed, Sherren Mocha, MD  05/23/2015 9:28 PM    [facility], [place], [place]  [address] Phone: [phone]; Fax: [phone]

## 2015-07-15 ENCOUNTER — Other Ambulatory Visit: Payer: Self-pay | Admitting: Family

## 2015-07-15 ENCOUNTER — Other Ambulatory Visit: Payer: Self-pay

## 2015-07-15 MED ORDER — ZOLPIDEM TARTRATE 5 MG PO TABS: 5.0000 mg | ORAL_TABLET | Freq: Every evening | ORAL | Status: DC | PRN

## 2015-07-21 ENCOUNTER — Ambulatory Visit (INDEPENDENT_AMBULATORY_CARE_PROVIDER_SITE_OTHER): Payer: Medicare Other | Admitting: Internal Medicine

## 2015-07-21 ENCOUNTER — Other Ambulatory Visit (INDEPENDENT_AMBULATORY_CARE_PROVIDER_SITE_OTHER): Payer: Medicare Other

## 2015-07-21 ENCOUNTER — Encounter: Payer: Self-pay | Admitting: Internal Medicine

## 2015-07-21 VITALS — BP 148/60 | HR 71 | Ht 66.0 in | Wt 146.0 lb

## 2015-07-21 DIAGNOSIS — Z951 Presence of aortocoronary bypass graft: Secondary | ICD-10-CM

## 2015-07-21 DIAGNOSIS — N390 Urinary tract infection, site not specified: Secondary | ICD-10-CM

## 2015-07-21 DIAGNOSIS — E785 Hyperlipidemia, unspecified: Secondary | ICD-10-CM

## 2015-07-21 DIAGNOSIS — I10 Essential (primary) hypertension: Secondary | ICD-10-CM

## 2015-07-21 DIAGNOSIS — R972 Elevated prostate specific antigen [PSA]: Secondary | ICD-10-CM

## 2015-07-21 DIAGNOSIS — N320 Bladder-neck obstruction: Secondary | ICD-10-CM | POA: Diagnosis not present

## 2015-07-21 DIAGNOSIS — Z23 Encounter for immunization: Secondary | ICD-10-CM | POA: Diagnosis not present

## 2015-07-21 DIAGNOSIS — I359 Nonrheumatic aortic valve disorder, unspecified: Secondary | ICD-10-CM

## 2015-07-21 DIAGNOSIS — I25810 Atherosclerosis of coronary artery bypass graft(s) without angina pectoris: Secondary | ICD-10-CM

## 2015-07-21 DIAGNOSIS — E039 Hypothyroidism, unspecified: Secondary | ICD-10-CM

## 2015-07-21 DIAGNOSIS — C679 Malignant neoplasm of bladder, unspecified: Secondary | ICD-10-CM

## 2015-07-21 DIAGNOSIS — N3000 Acute cystitis without hematuria: Secondary | ICD-10-CM

## 2015-07-21 DIAGNOSIS — Z0000 Encounter for general adult medical examination without abnormal findings: Secondary | ICD-10-CM

## 2015-07-21 LAB — HEPATIC FUNCTION PANEL
ALT: 14 U/L (ref 0–53)
AST: 19 U/L (ref 0–37)
Albumin: 4 g/dL (ref 3.5–5.2)
Alkaline Phosphatase: 54 U/L (ref 39–117)
BILIRUBIN TOTAL: 0.4 mg/dL (ref 0.2–1.2)
Bilirubin, Direct: 0.1 mg/dL (ref 0.0–0.3)
Total Protein: 6.8 g/dL (ref 6.0–8.3)

## 2015-07-21 LAB — URINALYSIS, ROUTINE W REFLEX MICROSCOPIC
BILIRUBIN URINE: NEGATIVE
Ketones, ur: NEGATIVE
Nitrite: POSITIVE — AB
PH: 5.5 (ref 5.0–8.0)
SPECIFIC GRAVITY, URINE: 1.02 (ref 1.000–1.030)
TOTAL PROTEIN, URINE-UPE24: 30 — AB
UROBILINOGEN UA: 0.2 (ref 0.0–1.0)
Urine Glucose: NEGATIVE

## 2015-07-21 LAB — CBC WITH DIFFERENTIAL/PLATELET
Basophils Absolute: 0 10*3/uL (ref 0.0–0.1)
Basophils Relative: 0.2 % (ref 0.0–3.0)
EOS PCT: 0.5 % (ref 0.0–5.0)
Eosinophils Absolute: 0 10*3/uL (ref 0.0–0.7)
HCT: 44.1 % (ref 39.0–52.0)
Hemoglobin: 14.8 g/dL (ref 13.0–17.0)
LYMPHS ABS: 0.9 10*3/uL (ref 0.7–4.0)
Lymphocytes Relative: 13.7 % (ref 12.0–46.0)
MCHC: 33.5 g/dL (ref 30.0–36.0)
MCV: 90.5 fl (ref 78.0–100.0)
MONO ABS: 1 10*3/uL (ref 0.1–1.0)
Monocytes Relative: 14.3 % — ABNORMAL HIGH (ref 3.0–12.0)
NEUTROS PCT: 71.3 % (ref 43.0–77.0)
Neutro Abs: 4.8 10*3/uL (ref 1.4–7.7)
Platelets: 190 10*3/uL (ref 150.0–400.0)
RBC: 4.88 Mil/uL (ref 4.22–5.81)
RDW: 13.9 % (ref 11.5–15.5)
WBC: 6.7 10*3/uL (ref 4.0–10.5)

## 2015-07-21 LAB — BASIC METABOLIC PANEL
BUN: 27 mg/dL — AB (ref 6–23)
CALCIUM: 9.5 mg/dL (ref 8.4–10.5)
CO2: 25 mEq/L (ref 19–32)
Chloride: 109 mEq/L (ref 96–112)
Creatinine, Ser: 1.56 mg/dL — ABNORMAL HIGH (ref 0.40–1.50)
GFR: 45.26 mL/min — AB (ref >60.00)
GLUCOSE: 113 mg/dL — AB (ref 70–99)
POTASSIUM: 4.4 meq/L (ref 3.5–5.1)
Sodium: 143 mEq/L (ref 135–145)

## 2015-07-21 LAB — LIPID PANEL
CHOL/HDL RATIO: 5
CHOLESTEROL: 192 mg/dL (ref 0–200)
HDL: 41.5 mg/dL (ref >39.00)
LDL Cholesterol: 131 mg/dL — ABNORMAL HIGH (ref 0–99)
NONHDL: 150.42
Triglycerides: 98 mg/dL (ref 0.0–149.0)
VLDL: 19.6 mg/dL (ref 0.0–40.0)

## 2015-07-21 LAB — PSA: PSA: 27.11 ng/mL — ABNORMAL HIGH (ref 0.10–4.00)

## 2015-07-21 LAB — TSH: TSH: 2.29 u[IU]/mL (ref 0.35–4.50)

## 2015-07-21 MED ORDER — CIPROFLOXACIN HCL 250 MG PO TABS: 250.0000 mg | ORAL_TABLET | Freq: Two times a day (BID) | ORAL | Status: DC

## 2015-07-21 NOTE — Assessment & Plan Note (Signed)
TSH 

## 2015-07-21 NOTE — Progress Notes (Signed)
Subjective:  Patient ID: [name], male    DOB: 1929/06/19  Age: 82 y.o. MRN: [id number]  CC: No chief complaint on file.   HPI NORLAN KAPRAL presents for a well exam. C/o fever, chills and urinary burning over the weekend. F/u CAD, HTN and AVR  Outpatient Prescriptions Prior to Visit  Medication Sig Dispense Refill  . amLODipine (NORVASC) 10 MG tablet Take 1 tablet (10 mg total) by mouth daily. 90 tablet 3  . Ascorbic Acid (VITAMIN C PO) Take 1 capsule by mouth daily.    Marland Kitchen aspirin 325 MG EC tablet Take 325 mg by mouth daily.      . B Complex-C (SUPER B COMPLEX PO) Take 1 tablet by mouth daily.    . cholecalciferol (VITAMIN D) 1000 UNITS tablet Take 1,000 Units by mouth daily.    . diphenhydrAMINE (BENADRYL) 25 MG tablet Take 12.5-25 mg by mouth at bedtime as needed for sleep.    . fish oil-omega-3 fatty acids 1000 MG capsule Take 1 g by mouth 2 (two) times daily.     Marland Kitchen lisinopril (PRINIVIL,ZESTRIL) 40 MG tablet TAKE 1 TABLET (40 MG TOTAL) BY MOUTH DAILY. 90 tablet 3  . Magnesium 500 MG CAPS Take 1 capsule by mouth daily.    . metoprolol succinate (TOPROL-XL) 25 MG 24 hr tablet TAKE 1 TABLET (25 MG TOTAL) BY MOUTH DAILY. 90 tablet 3  . multivitamin (THERAGRAN) tablet Take 1 tablet by mouth daily.      . nitroGLYCERIN (NITROSTAT) 0.4 MG SL tablet Place 1 tablet (0.4 mg total) under the tongue every 5 (five) minutes as needed for chest pain (MAX 3 TABLETS). 25 tablet 0  . RAPAFLO 8 MG CAPS capsule   1  . simvastatin (ZOCOR) 20 MG tablet Take 1 tablet (20 mg total) by mouth at bedtime. 90 tablet 3  . tiZANidine (ZANAFLEX) 4 MG tablet Take 1 tablet (4 mg total) by mouth at bedtime as needed for muscle spasms (cramps). 30 tablet 3  . zolpidem (AMBIEN) 5 MG tablet Take 1 tablet (5 mg total) by mouth at bedtime as needed for sleep. 15 tablet 0   No facility-administered medications prior to visit.    ROS Review of Systems  Constitutional: Positive for fever. Negative for appetite  change, fatigue and unexpected weight change.  HENT: Negative for congestion, nosebleeds, sneezing, sore throat and trouble swallowing.   Eyes: Negative for itching and visual disturbance.  Respiratory: Negative for cough.   Cardiovascular: Negative for chest pain, palpitations and leg swelling.  Gastrointestinal: Negative for nausea, diarrhea, blood in stool and abdominal distention.  Genitourinary: Positive for dysuria and urgency. Negative for frequency and hematuria.  Musculoskeletal: Negative for back pain, joint swelling, gait problem and neck pain.  Skin: Negative for rash.  Neurological: Negative for dizziness, tremors, speech difficulty and weakness.  Psychiatric/Behavioral: Negative for sleep disturbance, dysphoric mood and agitation. The patient is not nervous/anxious.     Objective:  BP 148/60 mmHg  Pulse 71  Ht 5\' 6"  (1.676 m)  Wt 146 lb (66.225 kg)  BMI 23.58 kg/m2  SpO2 96%  BP Readings from Last 3 Encounters:  07/21/15 148/60  05/21/15 160/60  09/01/14 140/70    Wt Readings from Last 3 Encounters:  07/21/15 146 lb (66.225 kg)  05/21/15 147 lb (66.679 kg)  09/01/14 154 lb (69.854 kg)    Physical Exam  Constitutional: He is oriented to person, place, and time. He appears well-developed and well-nourished. No distress.  HENT:  Head: Normocephalic and atraumatic.  Right Ear: External ear normal.  Left Ear: External ear normal.  Nose: Nose normal.  Mouth/Throat: Oropharynx is clear and moist. No oropharyngeal exudate.  Eyes: Conjunctivae and EOM are normal. Pupils are equal, round, and reactive to light. Right eye exhibits no discharge. Left eye exhibits no discharge. No scleral icterus.  Neck: Normal range of motion. Neck supple. No JVD present. No tracheal deviation present. No thyromegaly present.  Cardiovascular: Normal rate, regular rhythm, normal heart sounds and intact distal pulses.  Exam reveals no gallop and no friction rub.   No murmur  heard. Pulmonary/Chest: Effort normal and breath sounds normal. No stridor. No respiratory distress. He has no wheezes. He has no rales. He exhibits no tenderness.  Abdominal: Soft. Bowel sounds are normal. He exhibits no distension and no mass. There is no tenderness. There is no rebound and no guarding.  Musculoskeletal: Normal range of motion. He exhibits no edema or tenderness.  Lymphadenopathy:    He has no cervical adenopathy.  Neurological: He is alert and oriented to person, place, and time. He has normal reflexes. No cranial nerve deficit. He exhibits normal muscle tone. Coordination normal.  Skin: Skin is warm and dry. No rash noted. He is not diaphoretic. No erythema. No pallor.  Psychiatric: He has a normal mood and affect. His behavior is normal. Judgment and thought content normal.  rectal deferred  Lab Results  Component Value Date   WBC 5.0 01/09/2011   HGB 12.6* 03/10/2013   HCT 37.3* 03/10/2013   PLT 125.0* 01/09/2011   GLUCOSE 113* 05/27/2014   CHOL 101 03/10/2013   TRIG 44.0 03/10/2013   HDL 41.90 03/10/2013   LDLCALC 50 03/10/2013   ALT 26 03/11/2014   AST 31 03/11/2014   NA 142 05/27/2014   K 4.2 05/27/2014   CL 110 05/27/2014   CREATININE 1.5 05/27/2014   BUN 31* 05/27/2014   CO2 26 05/27/2014   TSH 2.80 03/11/2014   PSA 1.65 01/08/2009   INR 1.49 04/18/2010   HGBA1C 5.7 03/11/2014    No results found.  Assessment & Plan:   Diagnoses and all orders for this visit:  Well adult exam -     Basic metabolic panel; Future -     CBC with Differential/Platelet; Future -     Hepatic function panel; Future -     Lipid panel; Future -     PSA; Future -     TSH; Future -     Urinalysis; Future  Essential hypertension  Atherosclerosis of other coronary artery bypass graft without angina pectoris ([place])  CORONARY ARTERY BYPASS GRAFT, HX OF -     CBC with Differential/Platelet; Future -     Hepatic function panel; Future -     Lipid panel; Future -      TSH; Future  Aortic valve disorder  Hypothyroidism, unspecified hypothyroidism type -     Lipid panel; Future -     TSH; Future  Urinary tract infection, site not specified -     CULTURE, URINE COMPREHENSIVE; Future  Malignant neoplasm of urinary bladder, unspecified site ([facility])  Dyslipidemia -     Lipid panel; Future  Bladder neck obstruction -     PSA; Future  Other orders -     ciprofloxacin (CIPRO) 250 MG tablet; Take 1 tablet (250 mg total) by mouth 2 (two) times daily.  I am having Mr. Garnett start on ciprofloxacin. I am also having him maintain  his aspirin, fish oil-omega-3 fatty acids, multivitamin, tiZANidine, Ascorbic Acid (VITAMIN C PO), diphenhydrAMINE, nitroGLYCERIN, RAPAFLO, Magnesium, B Complex-C (SUPER B COMPLEX PO), cholecalciferol, amLODipine, lisinopril, metoprolol succinate, simvastatin, and zolpidem.  Meds ordered this encounter  Medications  . ciprofloxacin (CIPRO) 250 MG tablet    Sig: Take 1 tablet (250 mg total) by mouth 2 (two) times daily.    Dispense:  20 tablet    Refill:  0     Follow-up: No Follow-up on file.  Walker Kehr, MD

## 2015-07-21 NOTE — Progress Notes (Signed)
Pre visit review using our clinic review tool, if applicable. No additional management support is needed unless otherwise documented below in the visit note. 

## 2015-07-21 NOTE — Assessment & Plan Note (Signed)
Doing well 

## 2015-07-21 NOTE — Assessment & Plan Note (Signed)

## 2015-07-21 NOTE — Assessment & Plan Note (Signed)
Dr Burt Knack ASA, Amlodipine, Toprol, Lisinopril, Zocor

## 2015-07-21 NOTE — Assessment & Plan Note (Signed)
Dr Burt Knack CABG 1995 AVR ASA, Amlodipine, Toprol, Lisinopril, Zocor

## 2015-07-21 NOTE — Assessment & Plan Note (Signed)
Cipro po °

## 2015-07-21 NOTE — Assessment & Plan Note (Signed)
On Zocor Labs 

## 2015-07-21 NOTE — Patient Instructions (Signed)

## 2015-07-22 DIAGNOSIS — R972 Elevated prostate specific antigen [PSA]: Secondary | ICD-10-CM | POA: Insufficient documentation

## 2015-07-22 NOTE — Assessment & Plan Note (Signed)
1/17 ?prostatitis Cipro

## 2015-07-23 LAB — CULTURE, URINE COMPREHENSIVE: Colony Count: >=100000

## 2015-10-18 DIAGNOSIS — G4733 Obstructive sleep apnea (adult) (pediatric): Secondary | ICD-10-CM | POA: Diagnosis not present

## 2015-10-28 DIAGNOSIS — D225 Melanocytic nevi of trunk: Secondary | ICD-10-CM | POA: Diagnosis not present

## 2015-10-28 DIAGNOSIS — L821 Other seborrheic keratosis: Secondary | ICD-10-CM | POA: Diagnosis not present

## 2015-10-28 DIAGNOSIS — D1801 Hemangioma of skin and subcutaneous tissue: Secondary | ICD-10-CM | POA: Diagnosis not present

## 2015-10-28 DIAGNOSIS — Z85820 Personal history of malignant melanoma of skin: Secondary | ICD-10-CM | POA: Diagnosis not present

## 2015-10-28 DIAGNOSIS — D692 Other nonthrombocytopenic purpura: Secondary | ICD-10-CM | POA: Diagnosis not present

## 2015-10-28 DIAGNOSIS — Z85828 Personal history of other malignant neoplasm of skin: Secondary | ICD-10-CM | POA: Diagnosis not present

## 2015-10-28 DIAGNOSIS — L814 Other melanin hyperpigmentation: Secondary | ICD-10-CM | POA: Diagnosis not present

## 2016-01-21 ENCOUNTER — Ambulatory Visit: Payer: Medicare Other | Admitting: Internal Medicine

## 2016-01-24 ENCOUNTER — Other Ambulatory Visit (INDEPENDENT_AMBULATORY_CARE_PROVIDER_SITE_OTHER): Payer: Medicare Other

## 2016-01-24 ENCOUNTER — Encounter: Payer: Self-pay | Admitting: Internal Medicine

## 2016-01-24 ENCOUNTER — Ambulatory Visit (INDEPENDENT_AMBULATORY_CARE_PROVIDER_SITE_OTHER): Payer: Medicare Other | Admitting: Internal Medicine

## 2016-01-24 VITALS — BP 140/80 | HR 59 | Wt 142.0 lb

## 2016-01-24 DIAGNOSIS — E039 Hypothyroidism, unspecified: Secondary | ICD-10-CM | POA: Diagnosis not present

## 2016-01-24 DIAGNOSIS — H833X3 Noise effects on inner ear, bilateral: Secondary | ICD-10-CM

## 2016-01-24 DIAGNOSIS — E785 Hyperlipidemia, unspecified: Secondary | ICD-10-CM

## 2016-01-24 DIAGNOSIS — I25810 Atherosclerosis of coronary artery bypass graft(s) without angina pectoris: Secondary | ICD-10-CM

## 2016-01-24 DIAGNOSIS — H9190 Unspecified hearing loss, unspecified ear: Secondary | ICD-10-CM | POA: Insufficient documentation

## 2016-01-24 DIAGNOSIS — I10 Essential (primary) hypertension: Secondary | ICD-10-CM | POA: Diagnosis not present

## 2016-01-24 DIAGNOSIS — H833X9 Noise effects on inner ear, unspecified ear: Secondary | ICD-10-CM | POA: Insufficient documentation

## 2016-01-24 LAB — BASIC METABOLIC PANEL
BUN: 28 mg/dL — ABNORMAL HIGH (ref 6–23)
CALCIUM: 9.2 mg/dL (ref 8.4–10.5)
CO2: 25 meq/L (ref 19–32)
Chloride: 112 mEq/L (ref 96–112)
Creatinine, Ser: 1.55 mg/dL — ABNORMAL HIGH (ref 0.40–1.50)
GFR: 45.54 mL/min — AB (ref >60.00)
Glucose, Bld: 123 mg/dL — ABNORMAL HIGH (ref 70–99)
POTASSIUM: 4.2 meq/L (ref 3.5–5.1)
SODIUM: 143 meq/L (ref 135–145)

## 2016-01-24 LAB — TSH: TSH: 2.26 u[IU]/mL (ref 0.35–4.50)

## 2016-01-24 NOTE — Assessment & Plan Note (Signed)
  Cont w/Zocor 20 mg/d

## 2016-01-24 NOTE — Assessment & Plan Note (Signed)
Cont w/ASA, Amlodipine, Toprol, Lisinopril, Zocor

## 2016-01-24 NOTE — Assessment & Plan Note (Signed)
[facility] give [facility] aids

## 2016-01-24 NOTE — Assessment & Plan Note (Signed)
On ASA, Amlodipine, Toprol, Lisinopril

## 2016-01-24 NOTE — Progress Notes (Signed)
Subjective:  Patient ID: [name], male    DOB: [birth year]  Age: 82 y.o. MRN: [id number]  CC: No chief complaint on file.   HPI [name] presents for HTN, BPH, insomnia f/u  Outpatient Prescriptions Prior to Visit  Medication Sig Dispense Refill  . amLODipine (NORVASC) 10 MG tablet Take 1 tablet (10 mg total) by mouth daily. 90 tablet 3  . Ascorbic Acid (VITAMIN C PO) Take 1 capsule by mouth daily.    Marland Kitchen aspirin 325 MG EC tablet Take 325 mg by mouth daily.      . B Complex-C (SUPER B COMPLEX PO) Take 1 tablet by mouth daily.    . cholecalciferol (VITAMIN D) 1000 UNITS tablet Take 1,000 Units by mouth daily.    . ciprofloxacin (CIPRO) 250 MG tablet Take 1 tablet (250 mg total) by mouth 2 (two) times daily. 20 tablet 0  . diphenhydrAMINE (BENADRYL) 25 MG tablet Take 12.5-25 mg by mouth at bedtime as needed for sleep.    . fish oil-omega-3 fatty acids 1000 MG capsule Take 1 g by mouth 2 (two) times daily.     Marland Kitchen lisinopril (PRINIVIL,ZESTRIL) 40 MG tablet TAKE 1 TABLET (40 MG TOTAL) BY MOUTH DAILY. 90 tablet 3  . Magnesium 500 MG CAPS Take 1 capsule by mouth daily.    . metoprolol succinate (TOPROL-XL) 25 MG 24 hr tablet TAKE 1 TABLET (25 MG TOTAL) BY MOUTH DAILY. 90 tablet 3  . multivitamin (THERAGRAN) tablet Take 1 tablet by mouth daily.      . nitroGLYCERIN (NITROSTAT) 0.4 MG SL tablet Place 1 tablet (0.4 mg total) under the tongue every 5 (five) minutes as needed for chest pain (MAX 3 TABLETS). 25 tablet 0  . RAPAFLO 8 MG CAPS capsule   1  . simvastatin (ZOCOR) 20 MG tablet Take 1 tablet (20 mg total) by mouth at bedtime. 90 tablet 3  . tiZANidine (ZANAFLEX) 4 MG tablet Take 1 tablet (4 mg total) by mouth at bedtime as needed for muscle spasms (cramps). 30 tablet 3  . zolpidem (AMBIEN) 5 MG tablet Take 1 tablet (5 mg total) by mouth at bedtime as needed for sleep. 15 tablet 0   No facility-administered medications prior to visit.    ROS Review of Systems  Constitutional:  Negative for appetite change, fatigue and unexpected weight change.  HENT: Negative for congestion, nosebleeds, sneezing, sore throat and trouble swallowing.   Eyes: Negative for itching and visual disturbance.  Respiratory: Negative for cough.   Cardiovascular: Negative for chest pain, palpitations and leg swelling.  Gastrointestinal: Negative for nausea, diarrhea, blood in stool and abdominal distention.  Genitourinary: Positive for urgency. Negative for frequency and hematuria.  Musculoskeletal: Negative for back pain, joint swelling, gait problem and neck pain.  Skin: Negative for rash.  Neurological: Negative for dizziness, tremors, speech difficulty and weakness.  Psychiatric/Behavioral: Negative for sleep disturbance, dysphoric mood and agitation. The patient is not nervous/anxious.     Objective:  BP 140/80 mmHg  Pulse 59  Wt 142 lb (64.411 kg)  SpO2 95%  BP Readings from Last 3 Encounters:  01/24/16 140/80  07/21/15 148/60  05/21/15 160/60    Wt Readings from Last 3 Encounters:  01/24/16 142 lb (64.411 kg)  07/21/15 146 lb (66.225 kg)  05/21/15 147 lb (66.679 kg)    Physical Exam  Constitutional: He is oriented to person, place, and time. He appears well-developed. No distress.  NAD  HENT:  Mouth/Throat: Oropharynx is clear  and moist.  Eyes: Conjunctivae are normal. Pupils are equal, round, and reactive to light.  Neck: Normal range of motion. No JVD present. No thyromegaly present.  Cardiovascular: Normal rate, regular rhythm, normal heart sounds and intact distal pulses.  Exam reveals no gallop and no friction rub.   No murmur heard. Pulmonary/Chest: Effort normal and breath sounds normal. No respiratory distress. He has no wheezes. He has no rales. He exhibits no tenderness.  Abdominal: Soft. Bowel sounds are normal. He exhibits no distension and no mass. There is no tenderness. There is no rebound and no guarding.  Musculoskeletal: Normal range of motion. He  exhibits no edema or tenderness.  Lymphadenopathy:    He has no cervical adenopathy.  Neurological: He is alert and oriented to person, place, and time. He has normal reflexes. No cranial nerve deficit. He exhibits normal muscle tone. He displays a negative Romberg sign. Coordination and gait normal.  Skin: Skin is warm and dry. No rash noted.  Psychiatric: He has a normal mood and affect. His behavior is normal. Judgment and thought content normal.  hard hearing  Lab Results  Component Value Date   WBC 6.7 07/21/2015   HGB 14.8 07/21/2015   HCT 44.1 07/21/2015   PLT 190.0 07/21/2015   GLUCOSE 113* 07/21/2015   CHOL 192 07/21/2015   TRIG 98.0 07/21/2015   HDL 41.50 07/21/2015   LDLCALC 131* 07/21/2015   ALT 14 07/21/2015   AST 19 07/21/2015   NA 143 07/21/2015   K 4.4 07/21/2015   CL 109 07/21/2015   CREATININE 1.56* 07/21/2015   BUN 27* 07/21/2015   CO2 25 07/21/2015   TSH 2.29 07/21/2015   PSA 27.11* 07/21/2015   INR 1.49 04/18/2010   HGBA1C 5.7 03/11/2014    No results found.  Assessment & Plan:   There are no diagnoses linked to this encounter. I am having Mr. Melnyk maintain his aspirin, fish oil-omega-3 fatty acids, multivitamin, tiZANidine, Ascorbic Acid (VITAMIN C PO), diphenhydrAMINE, nitroGLYCERIN, RAPAFLO, Magnesium, B Complex-C (SUPER B COMPLEX PO), cholecalciferol, amLODipine, lisinopril, metoprolol succinate, simvastatin, zolpidem, and ciprofloxacin.  No orders of the defined types were placed in this encounter.     Follow-up: No Follow-up on file.  Walker Kehr, MD

## 2016-01-24 NOTE — Assessment & Plan Note (Signed)
Not on Meds TSH

## 2016-01-24 NOTE — Progress Notes (Signed)
Pre visit review using our clinic review tool, if applicable. No additional management support is needed unless otherwise documented below in the visit note. 

## 2016-02-04 DIAGNOSIS — Z8551 Personal history of malignant neoplasm of bladder: Secondary | ICD-10-CM | POA: Diagnosis not present

## 2016-02-04 DIAGNOSIS — R350 Frequency of micturition: Secondary | ICD-10-CM | POA: Diagnosis not present

## 2016-02-04 DIAGNOSIS — N401 Enlarged prostate with lower urinary tract symptoms: Secondary | ICD-10-CM | POA: Diagnosis not present

## 2016-03-22 DIAGNOSIS — G4733 Obstructive sleep apnea (adult) (pediatric): Secondary | ICD-10-CM | POA: Diagnosis not present

## 2016-03-23 DIAGNOSIS — H25813 Combined forms of age-related cataract, bilateral: Secondary | ICD-10-CM | POA: Diagnosis not present

## 2016-03-27 DIAGNOSIS — G4733 Obstructive sleep apnea (adult) (pediatric): Secondary | ICD-10-CM | POA: Diagnosis not present

## 2016-04-06 ENCOUNTER — Ambulatory Visit (INDEPENDENT_AMBULATORY_CARE_PROVIDER_SITE_OTHER): Payer: Medicare Other

## 2016-04-06 DIAGNOSIS — Z23 Encounter for immunization: Secondary | ICD-10-CM | POA: Diagnosis not present

## 2016-04-17 DIAGNOSIS — H9113 Presbycusis, bilateral: Secondary | ICD-10-CM | POA: Insufficient documentation

## 2016-04-17 DIAGNOSIS — H833X3 Noise effects on inner ear, bilateral: Secondary | ICD-10-CM | POA: Diagnosis not present

## 2016-06-27 ENCOUNTER — Other Ambulatory Visit: Payer: Self-pay | Admitting: Cardiovascular Disease

## 2016-06-27 DIAGNOSIS — I25709 Atherosclerosis of coronary artery bypass graft(s), unspecified, with unspecified angina pectoris: Secondary | ICD-10-CM

## 2016-06-27 DIAGNOSIS — I359 Nonrheumatic aortic valve disorder, unspecified: Secondary | ICD-10-CM

## 2016-06-27 DIAGNOSIS — I10 Essential (primary) hypertension: Secondary | ICD-10-CM

## 2016-06-27 MED ORDER — AMLODIPINE BESYLATE 10 MG PO TABS: 10.0000 mg | ORAL_TABLET | Freq: Every day | ORAL | 0 refills | Status: DC

## 2016-07-03 ENCOUNTER — Other Ambulatory Visit: Payer: Self-pay | Admitting: *Deleted

## 2016-07-03 DIAGNOSIS — I10 Essential (primary) hypertension: Secondary | ICD-10-CM

## 2016-07-03 DIAGNOSIS — I359 Nonrheumatic aortic valve disorder, unspecified: Secondary | ICD-10-CM

## 2016-07-03 DIAGNOSIS — I25709 Atherosclerosis of coronary artery bypass graft(s), unspecified, with unspecified angina pectoris: Secondary | ICD-10-CM

## 2016-07-03 MED ORDER — METOPROLOL SUCCINATE ER 25 MG PO TB24: ORAL_TABLET | ORAL | 0 refills | Status: DC

## 2016-07-18 ENCOUNTER — Other Ambulatory Visit: Payer: Self-pay

## 2016-07-18 DIAGNOSIS — I359 Nonrheumatic aortic valve disorder, unspecified: Secondary | ICD-10-CM

## 2016-07-18 DIAGNOSIS — I25709 Atherosclerosis of coronary artery bypass graft(s), unspecified, with unspecified angina pectoris: Secondary | ICD-10-CM

## 2016-07-18 DIAGNOSIS — I10 Essential (primary) hypertension: Secondary | ICD-10-CM

## 2016-07-18 MED ORDER — SIMVASTATIN 20 MG PO TABS: 20.0000 mg | ORAL_TABLET | Freq: Every day | ORAL | 0 refills | Status: DC

## 2016-07-18 NOTE — Progress Notes (Signed)
Cardiology Office Note    Date:  07/20/2016   ID:  [name], DOB 1928/12/15, MRN [id number]  PCP:  Walker Kehr, MD  Cardiologist:  Dr. Burt Knack  Chief Complaint: 12 months follow up  History of Present Illness:   [name] is a 83 y.o. male with hx of AVR,  CAD s/p CABG, HTN, HLD, PVD, carotid artery disease and renal artery stenosis presents for follow up.   The patient has aortic valve disease status post aortic valve replacement. He has been noted to have severe left ventricular hypertrophy. The patient underwent multivessel CABG in 1995. He then went on to develop severe symptomatic aortic stenosis. He underwent aortic valve replacement in 2011 with a 23 mm mitroflow aortic pericardial heart valve. His most recent echocardiogram in January 2016 demonstrated moderate bioprosthetic valve stenosis with a mean transvalvular gradient of 34 mmHg.  History of peripheral arterial disease status post left lower extremity stenting by Dr. Albertine Patricia. Also hx of Renal artery stenosis. Both last followed by Dr. Gwenlyn Found in 05/2014.  Last LE arterial doppler: 06/26/14 - Normal ABI bilaterally. Distal right SFA 50-60%. Patent left SFA stent. Last echo 07/27/2014 showed EF of 60-65% with moderate increased aortic gradient.  Last carotid doppler 05/12/15 - Stable 40-59% bilateral ICA stenosis.   Last seen by Dr. Altha Harm 11/16. Plan was to f/u with 6 months with echo.   Here today for follow up. His denies any exertional chest pain or shortness of breath. Plan with medications. No orthopnea, PND, syncope, lower extremity edema, blood in his stool or urine, palpitation or dizziness. He complains of leg pain after walking about 100 yards. This resolved with rest. He has been getting worse. Also complains of leg cramping at night only. This  has been stable.  Past Medical History:  Diagnosis Date  . Aortic valve disorders   . Atherosclerosis of renal artery ([place])   . Bilateral renal artery stenosis  ([facility])    documented at the time of angiography by Dr. Albertine Patricia  . BPH (benign prostatic hypertrophy)   . Cerebrovascular disease, unspecified   . Coronary atherosclerosis of unspecified type of vessel, native or graft   . History of cystoscopy    w/ bladder biopsy  . Malignant neoplasm of bladder, part unspecified   . Melanoma in situ of back ([place]) 2012   area of left scapula  . Occlusion and stenosis of carotid artery without mention of cerebral infarction   . Old myocardial infarction   . Other and unspecified hyperlipidemia   . Peripheral vascular disease, unspecified    history of stent left lower extremity placed by Dr. Albertine Patricia  . Postsurgical aortocoronary bypass status   . Postsurgical percutaneous transluminal coronary angioplasty status   . Scarlet fever   . Unspecified sleep apnea     Past Surgical History:  Procedure Laterality Date  . CORONARY ARTERY BYPASS GRAFT    . CYSTOSCOPY     w/ bladder biopsy  . MELANOMA EXCISION  2012   left scapular  area - clean margins.   . Percutaneous transluminal coronary angioplasty    . redo median sternotomy     extracorporeal circulation, aortic valve replacement using a 23-mm Mitroflow aortic pericardial heart valve.  Surgeon- Gilford Raid, MD 04-18-10    Current Medications:  Prior to Admission medications   Medication Sig Start Date End Date Taking? Authorizing Provider  amLODipine (NORVASC) 10 MG tablet Take 1 tablet (10 mg total) by mouth daily. 06/27/16  Yes Sherren Mocha, MD  Ascorbic Acid (VITAMIN C PO) Take 1 capsule by mouth daily.   Yes Historical Provider, MD  aspirin 325 MG EC tablet Take 325 mg by mouth daily.     Yes Historical Provider, MD  B Complex-C (SUPER B COMPLEX PO) Take 1 tablet by mouth daily.   Yes Historical Provider, MD  cholecalciferol (VITAMIN D) 1000 UNITS tablet Take 1,000 Units by mouth daily.   Yes Historical Provider, MD  fish oil-omega-3 fatty acids 1000 MG capsule Take 1 g by mouth 2 (two)  times daily.    Yes Historical Provider, MD  lisinopril (PRINIVIL,ZESTRIL) 40 MG tablet TAKE 1 TABLET (40 MG TOTAL) BY MOUTH DAILY. 05/21/15  Yes Sherren Mocha, MD  metoprolol succinate (TOPROL-XL) 25 MG 24 hr tablet TAKE 1 TABLET (25 MG TOTAL) BY MOUTH DAILY. 07/03/16  Yes Sherren Mocha, MD  multivitamin [facility]) tablet Take 1 tablet by mouth daily.     Yes Historical Provider, MD  nitroGLYCERIN (NITROSTAT) 0.4 MG SL tablet Place 0.4 mg under the tongue every 5 (five) minutes as needed for chest pain (Max 3 doses within 15 minutes call 911).   Yes Historical Provider, MD  simvastatin (ZOCOR) 20 MG tablet Take 1 tablet (20 mg total) by mouth at bedtime. 07/18/16  Yes Sherren Mocha, MD  tamsulosin (FLOMAX) 0.4 MG CAPS capsule Take 0.8 mg by mouth daily. 07/15/16  Yes Historical Provider, MD  tiZANidine (ZANAFLEX) 4 MG tablet Take 1 tablet (4 mg total) by mouth at bedtime as needed for muscle spasms (cramps). 11/10/13  Yes Cassandria Anger, MD    Allergies:   Sulfonamide derivatives   Social History   Social History  . Marital status: Married    Spouse name: N/A  . Number of children: 3  . Years of education: 58   Occupational History  . Chief Financial Officer Retired  . retired    Social History Main Topics  . Smoking status: Never Smoker  . Smokeless tobacco: Never Used  . Alcohol use Yes     Comment: rare  . Drug use: No  . Sexual activity: Not Currently   Other Topics Concern  . None   Social History Narrative   HSG, [employer], did some graduate work. Married '52. 1 son - '61; 2 daughters -'1, '63: grandchildren 34; great-grands 9. 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 Active gardner, Designer, jewellery. good marriage.     Family History:  The patient's family history includes Cancer in his other; Coronary artery disease in his father and mother; Diabetes in his mother; Heart attack in his father; Heart disease in his father and mother; Hyperlipidemia in his mother;  Hypertension in his mother; Stroke in his mother; Sudden death in his father.   ROS:   Please see the history of present illness.    ROS All other systems reviewed and are negative.   PHYSICAL EXAM:   VS:  BP 122/68 (BP Location: Right Arm)   Pulse (!) 55   Ht 5\' 6"  (1.676 m)   Wt 149 lb 1.9 oz (67.6 kg)   BMI 24.07 kg/m    GEN: Well nourished, well developed, in no acute distress  HEENT: normal  Neck: no JVD, or masses.  carotid bruit bilaterally  Cardiac: RRR; SEM, rubs, or gallops,no edema  Respiratory:  clear to auscultation bilaterally, normal work of breathing GI: soft, nontender, nondistended, + BS MS: no deformity or atrophy  Skin: warm and dry, no rash Neuro:  Alert  and Oriented x 3, Strength and sensation are intact Psych: euthymic mood, full affect  Wt Readings from Last 3 Encounters:  07/20/16 149 lb 1.9 oz (67.6 kg)  01/24/16 142 lb (64.4 kg)  07/21/15 146 lb (66.2 kg)      Studies/Labs Reviewed:   EKG:  EKG is not ordered today.    Recent Labs: 07/21/2015: ALT 14; Hemoglobin 14.8; Platelets 190.0 01/24/2016: BUN 28; Creatinine, Ser 1.55; Potassium 4.2; Sodium 143; TSH 2.26   Lipid Panel    Component Value Date/Time   CHOL 192 07/21/2015 1049   TRIG 98.0 07/21/2015 1049   HDL 41.50 07/21/2015 1049   CHOLHDL 5 07/21/2015 1049   VLDL 19.6 07/21/2015 1049   LDLCALC 131 (H) 07/21/2015 1049    Additional studies/ records that were reviewed today include:   Echocardiogram: 07/27/14  Study Conclusions  - Left ventricle: The cavity size was normal. Wall thickness was increased in a pattern of mild LVH. Systolic function was normal. The estimated ejection fraction was in the range of 60% to 65%. Wall motion was normal; there were no regional wall motion abnormalities. Doppler parameters are consistent with abnormal left ventricular relaxation (grade 1 diastolic dysfunction). - Aortic valve: There was a bioprosthetic aortic valve. There  was no regurgitation. Moderately increased gradient across the bioprosthetic aortic valve. Valve opening appeared restricted. Mean gradient (S): 34 mm Hg. - Mitral valve: Mildly calcified annulus. There was mild regurgitation. - Left atrium: The atrium was mildly dilated. - Right ventricle: The cavity size was normal. Systolic function was normal. - Pulmonary arteries: No complete TR doppler jet so unable to estimate PA systolic pressure. - Inferior vena cava: The vessel was normal in size. The respirophasic diameter changes were in the normal range (>= 50%), consistent with normal central venous pressure.  Impressions:  - Normal LV size with mild LV hypertrophy. EF 60-65%. Normal RV size and systolic function. Bioprosthetic aortic valve with leaflet restriction noted. Moderately increased mean gradient of 34 mmHg. Mild MR.   LE arterial doppler: 06/26/14 Normal ABI bilaterally. Distal right SFA 50-60%. Patent left SFA stent. F/u study in 6 months.   Carotid doppler: 05/12/15 Stable 40-59% bilateral ICA stenosis   ASSESSMENT & PLAN:    1. CAD s/p CABG - No angina. Continue ASA, statin and BB.   2. Essential HTN - Stable and well controlled on current regimen.  3. Aortic valve disease status post bioprosthetic aortic valve replacement with moderate aortic stenosis - Will update echo. Denies dyspnea or syncope.  4. PVD - Last LE arterial doppler: 06/26/14 - Normal ABI bilaterally. Distal right SFA 50-60%. Patent left SFA stent. - Complains of bilateral leg pain L > R. This has been getting worse. Will update study. Will defer evaluation of hip pain to his primary care provider.  5. Bilateral carotid artery disease  - Last carotid doppler 05/12/15 - Stable 40-59% bilateral ICA stenosis. Will update study.   6. HLD - Followed by PCP. Has appointment next week.   7. Leg cramp - Could be due to statin. This has been tolerable and stable. States this  improved with eating "pickles". Consider magnesium check during office visit with PCP next week. He is also due for lipid panel and LFT. He is aggrable to continue to take Zocor at current dose. If symptoms worsen, consider another statin regimen. He will require statin regimen due to his medical condition.    Medication Adjustments/Labs and Tests Ordered: Current medicines are reviewed at length with  the patient today.  Concerns regarding medicines are outlined above.  Medication changes, Labs and Tests ordered today are listed in the Patient Instructions below. There are no Patient Instructions on file for this visit.   Jarrett Soho, [place]  07/20/2016 10:21 AM    [facility] [address], [place], [place]  [address] Phone: [phone]; Fax: [phone]

## 2016-07-20 ENCOUNTER — Encounter: Payer: Self-pay | Admitting: Physician Assistant

## 2016-07-20 ENCOUNTER — Ambulatory Visit (INDEPENDENT_AMBULATORY_CARE_PROVIDER_SITE_OTHER): Payer: Medicare Other | Admitting: Physician Assistant

## 2016-07-20 VITALS — BP 122/68 | HR 55 | Ht 66.0 in | Wt 149.1 lb

## 2016-07-20 DIAGNOSIS — I739 Peripheral vascular disease, unspecified: Secondary | ICD-10-CM

## 2016-07-20 DIAGNOSIS — I25708 Atherosclerosis of coronary artery bypass graft(s), unspecified, with other forms of angina pectoris: Secondary | ICD-10-CM

## 2016-07-20 DIAGNOSIS — I6523 Occlusion and stenosis of bilateral carotid arteries: Secondary | ICD-10-CM

## 2016-07-20 DIAGNOSIS — I10 Essential (primary) hypertension: Secondary | ICD-10-CM | POA: Diagnosis not present

## 2016-07-20 DIAGNOSIS — E785 Hyperlipidemia, unspecified: Secondary | ICD-10-CM | POA: Diagnosis not present

## 2016-07-20 DIAGNOSIS — R252 Cramp and spasm: Secondary | ICD-10-CM | POA: Diagnosis not present

## 2016-07-20 DIAGNOSIS — I359 Nonrheumatic aortic valve disorder, unspecified: Secondary | ICD-10-CM | POA: Diagnosis not present

## 2016-07-20 NOTE — Patient Instructions (Signed)
Medication Instructions:  Your physician recommends that you continue on your current medications as directed. Please refer to the Current Medication list given to you today.   Labwork: -None  Testing/Procedures: Your physician has requested that you have a carotid duplex. This test is an ultrasound of the carotid arteries in your neck. It looks at blood flow through these arteries that supply the brain with blood. Allow one hour for this exam. There are no restrictions or special instructions.  Your physician has requested that you have a lower extremity arterial exercise duplex. During this test, exercise and ultrasound are used to evaluate arterial blood flow in the legs. Allow one hour for this exam. There are no restrictions or special instructions.  Your physician has requested that you have an echocardiogram. Echocardiography is a painless test that uses sound waves to create images of your heart. It provides your doctor with information about the size and shape of your heart and how well your heart's chambers and valves are working. This procedure takes approximately one hour. There are no restrictions for this procedure.    Follow-Up: Your physician wants you to follow-up in: 4 months with Dr. Burt Knack.  You will receive a reminder letter in the mail two months in advance. If you don't receive a letter, please call our office to schedule the follow-up appointment.   Any Other Special Instructions Will Be Listed Below (If Applicable).     If you need a refill on your cardiac medications before your next appointment, please call your pharmacy.

## 2016-07-22 ENCOUNTER — Other Ambulatory Visit: Payer: Self-pay | Admitting: Cardiovascular Disease

## 2016-07-22 DIAGNOSIS — I10 Essential (primary) hypertension: Secondary | ICD-10-CM

## 2016-07-22 DIAGNOSIS — I359 Nonrheumatic aortic valve disorder, unspecified: Secondary | ICD-10-CM

## 2016-07-22 DIAGNOSIS — I25709 Atherosclerosis of coronary artery bypass graft(s), unspecified, with unspecified angina pectoris: Secondary | ICD-10-CM

## 2016-07-26 ENCOUNTER — Ambulatory Visit (INDEPENDENT_AMBULATORY_CARE_PROVIDER_SITE_OTHER): Payer: Medicare Other | Admitting: Internal Medicine

## 2016-07-26 ENCOUNTER — Other Ambulatory Visit (INDEPENDENT_AMBULATORY_CARE_PROVIDER_SITE_OTHER): Payer: Medicare Other

## 2016-07-26 ENCOUNTER — Encounter: Payer: Self-pay | Admitting: Internal Medicine

## 2016-07-26 VITALS — BP 110/52 | HR 71 | Ht 66.0 in | Wt 146.0 lb

## 2016-07-26 DIAGNOSIS — E785 Hyperlipidemia, unspecified: Secondary | ICD-10-CM | POA: Diagnosis not present

## 2016-07-26 DIAGNOSIS — I679 Cerebrovascular disease, unspecified: Secondary | ICD-10-CM

## 2016-07-26 DIAGNOSIS — I10 Essential (primary) hypertension: Secondary | ICD-10-CM

## 2016-07-26 DIAGNOSIS — Z0000 Encounter for general adult medical examination without abnormal findings: Secondary | ICD-10-CM

## 2016-07-26 DIAGNOSIS — R972 Elevated prostate specific antigen [PSA]: Secondary | ICD-10-CM | POA: Diagnosis not present

## 2016-07-26 DIAGNOSIS — C679 Malignant neoplasm of bladder, unspecified: Secondary | ICD-10-CM

## 2016-07-26 DIAGNOSIS — E039 Hypothyroidism, unspecified: Secondary | ICD-10-CM

## 2016-07-26 LAB — LIPID PANEL
CHOLESTEROL: 176 mg/dL (ref 0–200)
HDL: 48.7 mg/dL (ref >39.00)
LDL Cholesterol: 105 mg/dL — ABNORMAL HIGH (ref 0–99)
NONHDL: 127.68
Total CHOL/HDL Ratio: 4
Triglycerides: 112 mg/dL (ref 0.0–149.0)
VLDL: 22.4 mg/dL (ref 0.0–40.0)

## 2016-07-26 LAB — BASIC METABOLIC PANEL
BUN: 32 mg/dL — AB (ref 6–23)
CALCIUM: 9.5 mg/dL (ref 8.4–10.5)
CHLORIDE: 111 meq/L (ref 96–112)
CO2: 25 meq/L (ref 19–32)
CREATININE: 1.4 mg/dL (ref 0.40–1.50)
GFR: 51.15 mL/min — ABNORMAL LOW (ref >60.00)
GLUCOSE: 102 mg/dL — AB (ref 70–99)
Potassium: 5 mEq/L (ref 3.5–5.1)
Sodium: 145 mEq/L (ref 135–145)

## 2016-07-26 LAB — CBC WITH DIFFERENTIAL/PLATELET
BASOS PCT: 0.3 % (ref 0.0–3.0)
Basophils Absolute: 0 10*3/uL (ref 0.0–0.1)
Eosinophils Absolute: 0.1 10*3/uL (ref 0.0–0.7)
Eosinophils Relative: 1 % (ref 0.0–5.0)
HEMATOCRIT: 40.8 % (ref 39.0–52.0)
Hemoglobin: 13.9 g/dL (ref 13.0–17.0)
LYMPHS ABS: 1.6 10*3/uL (ref 0.7–4.0)
Lymphocytes Relative: 21 % (ref 12.0–46.0)
MCHC: 33.9 g/dL (ref 30.0–36.0)
MCV: 90.1 fl (ref 78.0–100.0)
Monocytes Absolute: 0.7 10*3/uL (ref 0.1–1.0)
Monocytes Relative: 9.5 % (ref 3.0–12.0)
NEUTROS ABS: 5.2 10*3/uL (ref 1.4–7.7)
NEUTROS PCT: 68.2 % (ref 43.0–77.0)
PLATELETS: 163 10*3/uL (ref 150.0–400.0)
RBC: 4.53 Mil/uL (ref 4.22–5.81)
RDW: 13.4 % (ref 11.5–15.5)
WBC: 7.6 10*3/uL (ref 4.0–10.5)

## 2016-07-26 LAB — URINALYSIS
BILIRUBIN URINE: NEGATIVE
HGB URINE DIPSTICK: NEGATIVE
Ketones, ur: NEGATIVE
Leukocytes, UA: NEGATIVE
NITRITE: NEGATIVE
PH: 5.5 (ref 5.0–8.0)
Specific Gravity, Urine: 1.025 (ref 1.000–1.030)
URINE GLUCOSE: NEGATIVE
Urobilinogen, UA: 0.2 (ref 0.0–1.0)

## 2016-07-26 LAB — HEPATIC FUNCTION PANEL
ALBUMIN: 4 g/dL (ref 3.5–5.2)
ALK PHOS: 43 U/L (ref 39–117)
ALT: 12 U/L (ref 0–53)
AST: 18 U/L (ref 0–37)
Bilirubin, Direct: 0.1 mg/dL (ref 0.0–0.3)
TOTAL PROTEIN: 6.2 g/dL (ref 6.0–8.3)
Total Bilirubin: 0.5 mg/dL (ref 0.2–1.2)

## 2016-07-26 LAB — PSA: PSA: 2.56 ng/mL (ref 0.10–4.00)

## 2016-07-26 LAB — TSH: TSH: 3.09 u[IU]/mL (ref 0.35–4.50)

## 2016-07-26 NOTE — Assessment & Plan Note (Signed)
Zocor Labs

## 2016-07-26 NOTE — Progress Notes (Signed)
Subjective:  Patient ID: [name], male    DOB: 1929-06-22  Age: 83 y.o. MRN: [id number]  CC: No chief complaint on file.   HPI [name] presents for well exam  Outpatient Medications Prior to Visit  Medication Sig Dispense Refill  . amLODipine (NORVASC) 10 MG tablet Take 1 tablet (10 mg total) by mouth daily. 30 tablet 11  . Ascorbic Acid (VITAMIN C PO) Take 1 capsule by mouth daily.    Marland Kitchen aspirin 325 MG EC tablet Take 325 mg by mouth daily.      . B Complex-C (SUPER B COMPLEX PO) Take 1 tablet by mouth daily.    . cholecalciferol (VITAMIN D) 1000 UNITS tablet Take 1,000 Units by mouth daily.    . fish oil-omega-3 fatty acids 1000 MG capsule Take 1 g by mouth 2 (two) times daily.     Marland Kitchen lisinopril (PRINIVIL,ZESTRIL) 40 MG tablet TAKE 1 TABLET (40 MG TOTAL) BY MOUTH DAILY. 90 tablet 3  . metoprolol succinate (TOPROL-XL) 25 MG 24 hr tablet TAKE 1 TABLET (25 MG TOTAL) BY MOUTH DAILY. 30 tablet 0  . multivitamin (THERAGRAN) tablet Take 1 tablet by mouth daily.      . nitroGLYCERIN (NITROSTAT) 0.4 MG SL tablet Place 0.4 mg under the tongue every 5 (five) minutes as needed for chest pain (Max 3 doses within 15 minutes call 911).    . simvastatin (ZOCOR) 20 MG tablet Take 1 tablet (20 mg total) by mouth at bedtime. 30 tablet 0  . tamsulosin (FLOMAX) 0.4 MG CAPS capsule Take 0.8 mg by mouth daily.  10  . tiZANidine (ZANAFLEX) 4 MG tablet Take 1 tablet (4 mg total) by mouth at bedtime as needed for muscle spasms (cramps). 30 tablet 3   No facility-administered medications prior to visit.     ROS Review of Systems  Constitutional: Negative for appetite change, fatigue and unexpected weight change.  HENT: Negative for congestion, nosebleeds, sneezing, sore throat and trouble swallowing.   Eyes: Negative for itching and visual disturbance.  Respiratory: Negative for cough.   Cardiovascular: Negative for chest pain, palpitations and leg swelling.  Gastrointestinal: Negative for  abdominal distention, blood in stool, diarrhea and nausea.  Genitourinary: Negative for frequency and hematuria.  Musculoskeletal: Negative for back pain, gait problem, joint swelling and neck pain.  Skin: Negative for rash.  Neurological: Negative for dizziness, tremors, speech difficulty and weakness.  Psychiatric/Behavioral: Positive for sleep disturbance. Negative for agitation and dysphoric mood. The patient is not nervous/anxious.     Objective:  BP (!) 110/52   Pulse 71   Ht 5\' 6"  (1.676 m)   Wt 146 lb (66.2 kg)   SpO2 96%   BMI 23.57 kg/m   BP Readings from Last 3 Encounters:  07/26/16 (!) 110/52  07/20/16 122/68  01/24/16 140/80    Wt Readings from Last 3 Encounters:  07/26/16 146 lb (66.2 kg)  07/20/16 149 lb 1.9 oz (67.6 kg)  01/24/16 142 lb (64.4 kg)    Physical Exam  Constitutional: He is oriented to person, place, and time. He appears well-developed. No distress.  NAD  HENT:  Mouth/Throat: Oropharynx is clear and moist.  Eyes: Conjunctivae are normal. Pupils are equal, round, and reactive to light.  Neck: Normal range of motion. No JVD present. No thyromegaly present.  Cardiovascular: Normal rate, regular rhythm and intact distal pulses.  Exam reveals no gallop and no friction rub.   Murmur heard. Pulmonary/Chest: Effort normal and breath sounds  normal. No respiratory distress. He has no wheezes. He has no rales. He exhibits no tenderness.  Abdominal: Soft. Bowel sounds are normal. He exhibits no distension and no mass. There is no tenderness. There is no rebound and no guarding.  Musculoskeletal: Normal range of motion. He exhibits no edema or tenderness.  Lymphadenopathy:    He has no cervical adenopathy.  Neurological: He is alert and oriented to person, place, and time. He has normal reflexes. No cranial nerve deficit. He exhibits normal muscle tone. He displays a negative Romberg sign. Coordination and gait normal.  Skin: Skin is warm and dry. No rash  noted.  Psychiatric: He has a normal mood and affect. His behavior is normal. Judgment and thought content normal.  Rectal - per Dr Dr Junious Silk  Lab Results  Component Value Date   WBC 6.7 07/21/2015   HGB 14.8 07/21/2015   HCT 44.1 07/21/2015   PLT 190.0 07/21/2015   GLUCOSE 123 (H) 01/24/2016   CHOL 192 07/21/2015   TRIG 98.0 07/21/2015   HDL 41.50 07/21/2015   LDLCALC 131 (H) 07/21/2015   ALT 14 07/21/2015   AST 19 07/21/2015   NA 143 01/24/2016   K 4.2 01/24/2016   CL 112 01/24/2016   CREATININE 1.55 (H) 01/24/2016   BUN 28 (H) 01/24/2016   CO2 25 01/24/2016   TSH 2.26 01/24/2016   PSA 27.11 (H) 07/21/2015   INR 1.49 04/18/2010   HGBA1C 5.7 03/11/2014    No results found.  Assessment & Plan:   There are no diagnoses linked to this encounter. I am having Mr. Denison maintain his aspirin, fish oil-omega-3 fatty acids, multivitamin, tiZANidine, Ascorbic Acid (VITAMIN C PO), B Complex-C (SUPER B COMPLEX PO), cholecalciferol, lisinopril, metoprolol succinate, simvastatin, tamsulosin, nitroGLYCERIN, and amLODipine.  No orders of the defined types were placed in this encounter.    Follow-up: No Follow-up on file.  Walker Kehr, MD

## 2016-07-26 NOTE — Assessment & Plan Note (Signed)
Exam q 12 mo

## 2016-07-26 NOTE — Assessment & Plan Note (Signed)
ASA, Zocor, BP meds

## 2016-07-26 NOTE — Assessment & Plan Note (Signed)
PSA

## 2016-07-26 NOTE — Progress Notes (Signed)
Pre visit review using our clinic review tool, if applicable. No additional management support is needed unless otherwise documented below in the visit note. 

## 2016-07-26 NOTE — Assessment & Plan Note (Signed)
ASA, Amlodipine, Toprol, Lisinopril 

## 2016-07-26 NOTE — Assessment & Plan Note (Signed)
Labs

## 2016-07-26 NOTE — Patient Instructions (Signed)

## 2016-07-31 ENCOUNTER — Other Ambulatory Visit: Payer: Self-pay | Admitting: *Deleted

## 2016-07-31 DIAGNOSIS — I10 Essential (primary) hypertension: Secondary | ICD-10-CM

## 2016-07-31 DIAGNOSIS — I359 Nonrheumatic aortic valve disorder, unspecified: Secondary | ICD-10-CM

## 2016-07-31 DIAGNOSIS — I25709 Atherosclerosis of coronary artery bypass graft(s), unspecified, with unspecified angina pectoris: Secondary | ICD-10-CM

## 2016-07-31 MED ORDER — LISINOPRIL 40 MG PO TABS: ORAL_TABLET | ORAL | 3 refills | Status: DC

## 2016-07-31 MED ORDER — METOPROLOL SUCCINATE ER 25 MG PO TB24: ORAL_TABLET | ORAL | 3 refills | Status: DC

## 2016-07-31 NOTE — Telephone Encounter (Signed)
Per recent office visit  6. HLD - Followed by PCP. Has appointment next week.   7. Leg cramp - Could be due to statin. This has been tolerable and stable. States this improved with eating "pickles". Consider magnesium check during office visit with PCP next week. He is also due for lipid panel and LFT. He is aggrable to continue to take Zocor at current dose. If symptoms worsen, consider another statin regimen. He will require statin regimen due to his medical condition.   Patient had a lipid panel drawn. Just wanted to clarify okay to refill and if patient should remain on same dose based on lab results. Please advise. Thanks, MI

## 2016-08-01 ENCOUNTER — Other Ambulatory Visit: Payer: Self-pay | Admitting: Cardiovascular Disease

## 2016-08-01 ENCOUNTER — Encounter (HOSPITAL_COMMUNITY): Payer: Medicare Other

## 2016-08-01 DIAGNOSIS — I25709 Atherosclerosis of coronary artery bypass graft(s), unspecified, with unspecified angina pectoris: Secondary | ICD-10-CM

## 2016-08-01 DIAGNOSIS — I359 Nonrheumatic aortic valve disorder, unspecified: Secondary | ICD-10-CM

## 2016-08-01 DIAGNOSIS — I10 Essential (primary) hypertension: Secondary | ICD-10-CM

## 2016-08-01 NOTE — Telephone Encounter (Signed)
I would send refill request to Dr Plotnikov's office to address.  Pt just had labs 07/26/16 by PCP and per note no changes were recommended.

## 2016-08-03 ENCOUNTER — Other Ambulatory Visit (HOSPITAL_COMMUNITY): Payer: Medicare Other

## 2016-08-04 ENCOUNTER — Inpatient Hospital Stay (HOSPITAL_COMMUNITY): Admission: RE | Admit: 2016-08-04 | Payer: Medicare Other | Source: Ambulatory Visit

## 2016-08-07 ENCOUNTER — Encounter (HOSPITAL_COMMUNITY): Payer: Medicare Other

## 2016-08-08 ENCOUNTER — Ambulatory Visit (HOSPITAL_COMMUNITY)
Admission: RE | Admit: 2016-08-08 | Discharge: 2016-08-08 | Disposition: A | Discharge status: Home / Self Care | Payer: Medicare Other | Source: Ambulatory Visit | Attending: Cardiovascular Disease | Admitting: Cardiovascular Disease

## 2016-08-08 ENCOUNTER — Other Ambulatory Visit: Payer: Self-pay | Admitting: Cardiovascular Disease

## 2016-08-08 ENCOUNTER — Other Ambulatory Visit: Payer: Self-pay | Admitting: Physician Assistant

## 2016-08-08 DIAGNOSIS — I739 Peripheral vascular disease, unspecified: Secondary | ICD-10-CM

## 2016-08-08 DIAGNOSIS — I6523 Occlusion and stenosis of bilateral carotid arteries: Secondary | ICD-10-CM

## 2016-08-09 ENCOUNTER — Encounter (HOSPITAL_COMMUNITY): Payer: Medicare Other

## 2016-08-11 ENCOUNTER — Other Ambulatory Visit: Payer: Self-pay | Admitting: Cardiovascular Disease

## 2016-08-11 DIAGNOSIS — I739 Peripheral vascular disease, unspecified: Secondary | ICD-10-CM

## 2016-08-14 ENCOUNTER — Encounter: Payer: Self-pay | Admitting: Family Medicine

## 2016-08-14 ENCOUNTER — Ambulatory Visit (INDEPENDENT_AMBULATORY_CARE_PROVIDER_SITE_OTHER): Payer: Medicare Other | Admitting: Family Medicine

## 2016-08-14 VITALS — BP 122/64 | HR 82 | Temp 99.7°F | Ht 66.0 in | Wt 145.8 lb

## 2016-08-14 DIAGNOSIS — I25810 Atherosclerosis of coronary artery bypass graft(s) without angina pectoris: Secondary | ICD-10-CM | POA: Diagnosis not present

## 2016-08-14 DIAGNOSIS — J111 Influenza due to unidentified influenza virus with other respiratory manifestations: Secondary | ICD-10-CM

## 2016-08-14 DIAGNOSIS — R509 Fever, unspecified: Secondary | ICD-10-CM

## 2016-08-14 DIAGNOSIS — N183 Chronic kidney disease, stage 3 unspecified: Secondary | ICD-10-CM

## 2016-08-14 LAB — POCT INFLUENZA A: Rapid Influenza A Ag: NEGATIVE

## 2016-08-14 MED ORDER — OSELTAMIVIR PHOSPHATE 30 MG PO CAPS: 30.0000 mg | ORAL_CAPSULE | Freq: Two times a day (BID) | ORAL | 0 refills | Status: DC

## 2016-08-14 NOTE — Progress Notes (Signed)
Pre visit review using our clinic review tool, if applicable. No additional management support is needed unless otherwise documented below in the visit note. 

## 2016-08-14 NOTE — Patient Instructions (Signed)
You have the flu/influenza  History and exam today are suggestive of viral process. Patients influenza test was negative.  Pretest probability of influenza was high.   Patient will be treated with Tamiflu. 30mg  twice a day (due to kidney function) Prophylaxis for other [place] patients: no, wife recovering from illness Symptomatic treatment with: rest, staying well hydrated, robitussin DM  Finally, we reviewed reasons to return to care including if symptoms worsen or persist or new concerns arise (particularly chest pain, shortness of breath)  Meds ordered this encounter  Medications  . oseltamivir (TAMIFLU) 30 MG capsule    Sig: Take 1 capsule (30 mg total) by mouth 2 (two) times daily.    Dispense:  10 capsule    Refill:  0

## 2016-08-14 NOTE — Progress Notes (Addendum)
PCP: Walker Kehr, MD  Subjective:  [name] is a 83 y.o. year old very pleasant male patient who presents with flu like symptoms including fever, chills, cough, congestion, fatigue -other symptoms: some sinus drainage, no body aches yet -started: last night - patient did receive flu shot this year -inside 48 hour treatment window if needed for tamiflu: yes -high risk condition (children <5, adults >65, chronic pulmonary or cardiac condition, immunosuppression, pregnancy, nursing home resident, morbid obesity) : yes, history of open heart surgery -symptoms are worsening -previous treatments: robitussen dm - endorses sick contact; specifically influenza: likely as wife with fever 102, body aches though was flu test negative and untreated  ROS-denies SOB, NVD, sinus or dental pain  Pertinent Past Medical History-  Patient Active Problem List   Diagnosis Date Noted  . Hearing loss d/t noise 01/24/2016  . Elevated PSA 07/22/2015  . Urinary tract infection, site not specified 07/21/2015  . Edema 09/02/2014  . Well adult exam 03/11/2014  . Cramps of lower extremity 11/10/2013  . Cervical disc disease 09/25/2012  . Hypertension 06/11/2012  . Bradycardia 04/17/2012  . Need for prophylactic vaccination with tetanus-diphtheria (TD) 02/08/2012  . Hypothyroidism 01/10/2011  . TMJ SYNDROME 06/08/2010  . RBBB 10/15/2009  . Aortic valve disorder 12/11/2008  . Malignant neoplasm of bladder ([place]) 08/27/2007  . Dyslipidemia 08/27/2007  . MYOCARDIAL INFARCTION, HX OF 08/27/2007  . Coronary atherosclerosis 08/27/2007  . Carotid stenosis 08/27/2007  . Cerebrovascular disease 08/27/2007  . RENAL ARTERY STENOSIS 08/27/2007  . Peripheral vascular disease ([place]) 08/27/2007  . SLEEP APNEA 08/27/2007  . BENIGN PROSTATIC HYPERTROPHY, HX OF 08/27/2007  . CORONARY ARTERY BYPASS GRAFT, HX OF 08/27/2007  . PERCUTANEOUS TRANSLUMINAL CORONARY ANGIOPLASTY, HX OF 08/27/2007    Medications- reviewed   Current Outpatient Prescriptions  Medication Sig Dispense Refill  . amLODipine (NORVASC) 10 MG tablet Take 1 tablet (10 mg total) by mouth daily. 30 tablet 11  . Ascorbic Acid (VITAMIN C PO) Take 1 capsule by mouth daily.    Marland Kitchen aspirin 325 MG EC tablet Take 325 mg by mouth daily.      . B Complex-C (SUPER B COMPLEX PO) Take 1 tablet by mouth daily.    . cholecalciferol (VITAMIN D) 1000 UNITS tablet Take 1,000 Units by mouth daily.    . fish oil-omega-3 fatty acids 1000 MG capsule Take 1 g by mouth 2 (two) times daily.     Marland Kitchen lisinopril (PRINIVIL,ZESTRIL) 40 MG tablet TAKE 1 TABLET (40 MG TOTAL) BY MOUTH DAILY. 90 tablet 3  . metoprolol succinate (TOPROL-XL) 25 MG 24 hr tablet TAKE 1 TABLET (25 MG TOTAL) BY MOUTH DAILY. 90 tablet 3  . multivitamin (THERAGRAN) tablet Take 1 tablet by mouth daily.      . nitroGLYCERIN (NITROSTAT) 0.4 MG SL tablet Place 0.4 mg under the tongue every 5 (five) minutes as needed for chest pain (Max 3 doses within 15 minutes call 911).    . simvastatin (ZOCOR) 20 MG tablet TAKE ONE TABLET BY MOUTH EVERY NIGHT AT BEDTIME 30 tablet 3  . tamsulosin (FLOMAX) 0.4 MG CAPS capsule Take 0.8 mg by mouth daily.  10  . tiZANidine (ZANAFLEX) 4 MG tablet Take 1 tablet (4 mg total) by mouth at bedtime as needed for muscle spasms (cramps). 30 tablet 3  . oseltamivir (TAMIFLU) 30 MG capsule Take 1 capsule (30 mg total) by mouth 2 (two) times daily. 10 capsule 0   No current facility-administered medications for this visit.  Objective: BP 122/64 (BP Location: Left Arm, Patient Position: Sitting, Cuff Size: Large)   Pulse 82   Temp 99.7 F (37.6 C) (Oral)   Ht 5\' 6"  (1.676 m)   Wt 145 lb 12.8 oz (66.1 kg)   SpO2 95%   BMI 23.53 kg/m  Gen: NAD, appears fatigued HEENT: Turbinates erythematous, TM normal, pharynx mildly erythematous with no tonsilar exudate or edema, no sinus tenderness CV: RRR systolic murmur per his baseline Lungs: CTAB no crackles, wheeze,  rhonchi Abdomen: soft/nontender/nondistended/normal bowel sounds. Ext: no edema Skin: warm, dry, no rash  Results for orders placed or performed in visit on 08/14/16 (from the past 24 hour(s))  POCT Influenza A     Status: None   Collection Time: 08/14/16  5:02 PM  Result Value Ref Range   Rapid Influenza A Ag Neg     Assessment/Plan:  Influenza History and exam today are suggestive of viral process. Patients influenza test was negative.  Pretest probability of influenza was high.   Patient will be treated with Tamiflu. 30mg  twice a day (due CKD III with GFR in 50s) Prophylaxis for other  patients: no, wife recovering from illness Symptomatic treatment with: rest, staying well hydrated, robitussin DM  High risk patient with known CAD with history CABG and age 64.   Finally, we reviewed reasons to return to care including if symptoms worsen or persist or new concerns arise (particularly chest pain, shortness of breath)  Meds ordered this encounter  Medications  . oseltamivir (TAMIFLU) 30 MG capsule    Sig: Take 1 capsule (30 mg total) by mouth 2 (two) times daily.    Dispense:  10 capsule    Refill:  0    Garret Reddish, MD

## 2016-08-17 ENCOUNTER — Other Ambulatory Visit (HOSPITAL_COMMUNITY): Payer: Medicare Other

## 2016-08-29 ENCOUNTER — Ambulatory Visit (HOSPITAL_COMMUNITY): Payer: Medicare Other | Attending: Cardiology

## 2016-08-29 ENCOUNTER — Other Ambulatory Visit: Payer: Self-pay

## 2016-08-29 DIAGNOSIS — I359 Nonrheumatic aortic valve disorder, unspecified: Secondary | ICD-10-CM | POA: Insufficient documentation

## 2016-08-29 DIAGNOSIS — I340 Nonrheumatic mitral (valve) insufficiency: Secondary | ICD-10-CM | POA: Insufficient documentation

## 2016-10-04 DIAGNOSIS — G4733 Obstructive sleep apnea (adult) (pediatric): Secondary | ICD-10-CM | POA: Diagnosis not present

## 2016-11-07 DIAGNOSIS — H903 Sensorineural hearing loss, bilateral: Secondary | ICD-10-CM | POA: Diagnosis not present

## 2016-11-13 ENCOUNTER — Other Ambulatory Visit: Payer: Self-pay | Admitting: Cardiovascular Disease

## 2016-11-13 DIAGNOSIS — I25709 Atherosclerosis of coronary artery bypass graft(s), unspecified, with unspecified angina pectoris: Secondary | ICD-10-CM

## 2016-11-13 DIAGNOSIS — I10 Essential (primary) hypertension: Secondary | ICD-10-CM

## 2016-11-13 DIAGNOSIS — I359 Nonrheumatic aortic valve disorder, unspecified: Secondary | ICD-10-CM

## 2016-11-21 DIAGNOSIS — L821 Other seborrheic keratosis: Secondary | ICD-10-CM | POA: Diagnosis not present

## 2016-11-21 DIAGNOSIS — D4989 Neoplasm of unspecified behavior of other specified sites: Secondary | ICD-10-CM | POA: Diagnosis not present

## 2016-11-21 DIAGNOSIS — L905 Scar conditions and fibrosis of skin: Secondary | ICD-10-CM | POA: Diagnosis not present

## 2016-11-21 DIAGNOSIS — Z85820 Personal history of malignant melanoma of skin: Secondary | ICD-10-CM | POA: Diagnosis not present

## 2016-11-21 DIAGNOSIS — Z85828 Personal history of other malignant neoplasm of skin: Secondary | ICD-10-CM | POA: Diagnosis not present

## 2016-12-19 DIAGNOSIS — Z85828 Personal history of other malignant neoplasm of skin: Secondary | ICD-10-CM | POA: Diagnosis not present

## 2016-12-19 DIAGNOSIS — D487 Neoplasm of uncertain behavior of other specified sites: Secondary | ICD-10-CM | POA: Diagnosis not present

## 2016-12-19 DIAGNOSIS — D485 Neoplasm of uncertain behavior of skin: Secondary | ICD-10-CM | POA: Diagnosis not present

## 2016-12-19 DIAGNOSIS — Z85820 Personal history of malignant melanoma of skin: Secondary | ICD-10-CM | POA: Diagnosis not present

## 2017-03-23 DIAGNOSIS — H2181 Floppy iris syndrome: Secondary | ICD-10-CM | POA: Diagnosis not present

## 2017-03-23 DIAGNOSIS — H25813 Combined forms of age-related cataract, bilateral: Secondary | ICD-10-CM | POA: Diagnosis not present

## 2017-04-02 DIAGNOSIS — G4733 Obstructive sleep apnea (adult) (pediatric): Secondary | ICD-10-CM | POA: Diagnosis not present

## 2017-04-23 ENCOUNTER — Telehealth: Payer: Self-pay | Admitting: Cardiovascular Disease

## 2017-04-23 NOTE — Telephone Encounter (Signed)
Walk In pt Form-pt has questions about medications please call. Placed in [employer].

## 2017-04-25 ENCOUNTER — Ambulatory Visit: Payer: Medicare Other

## 2017-04-27 ENCOUNTER — Telehealth: Payer: Self-pay

## 2017-04-27 NOTE — Telephone Encounter (Signed)
Received walk-in form stating: "Has started having 'anxiety' episodes - when he takes Lisinopril, they seem to go away. Could the dosage be split or increased. Please call"  Attempted to call patient, but hung up after several rings as no one or VM picked up.  Will try again later.

## 2017-05-01 ENCOUNTER — Other Ambulatory Visit: Payer: Self-pay | Admitting: Cardiovascular Disease

## 2017-05-01 DIAGNOSIS — I25709 Atherosclerosis of coronary artery bypass graft(s), unspecified, with unspecified angina pectoris: Secondary | ICD-10-CM

## 2017-05-01 DIAGNOSIS — I359 Nonrheumatic aortic valve disorder, unspecified: Secondary | ICD-10-CM

## 2017-05-01 DIAGNOSIS — I10 Essential (primary) hypertension: Secondary | ICD-10-CM

## 2017-05-01 NOTE — Telephone Encounter (Signed)
Medication Detail    Disp Refills Start End   metoprolol succinate (TOPROL-XL) 25 MG 24 hr tablet 90 tablet 3 07/31/2016    Sig: TAKE 1 TABLET (25 MG TOTAL) BY MOUTH DAILY.   Sent to pharmacy as: metoprolol succinate (TOPROL-XL) 25 MG 24 hr tablet   E-Prescribing Status: Receipt confirmed by pharmacy (07/31/2016 10:05 AM EST)   Associated Diagnoses   Aortic valve disorder     Essential hypertension     Atherosclerosis of CABG w unsp angina pectoris [facility])     [place], [place]

## 2017-05-02 ENCOUNTER — Encounter: Payer: Self-pay | Admitting: Cardiovascular Disease

## 2017-05-02 NOTE — Telephone Encounter (Signed)
This encounter was created in error - please disregard.

## 2017-05-02 NOTE — Telephone Encounter (Signed)
Received message: [name] 2 hours ago (11:03 AM)     Patient has questions about his lisinopril dosage and symptoms he's been having.           Attempted to call patient but number was busy. Will try again later.

## 2017-05-02 NOTE — Telephone Encounter (Signed)
Patient has questions about his lisinopril dosage and symptoms he's been having.

## 2017-05-02 NOTE — Telephone Encounter (Signed)
Patient reports he takes his Lisinopril at night and always has. He recently, he has noticed a "blanket coming over himself of anxiety and despair" and the afternoons that seems to dissipate when he takes his Lisinopril. He started checking his BP when he felt this way, and systolic was up in the 594L. He states his systolic BP is usually around 140.  He would like to get his BP checked and talk to the [facility] about medication options and if he should take his medications differently so he will feel better. Scheduled patient for Monday in the HTN Clinic. He was grateful for call and agrees with treatment plan.

## 2017-05-06 NOTE — Progress Notes (Signed)
Patient ID: [name]                 DOB: 09/06/1928                      MRN: [id number]     HPI: [name] is a 83 y.o. male referred by Dr. Burt Knack to HTN clinic. PMH is significant for AVR (2011), CAD s/p CABG (1995), HTN, HLD, PVD, carotid artery disease, and renal artery stenosis. Pt's BP has been well-controlled, but he endorses 'anxiety' episodes that improve when he takes his lisinopril.   Pt presents in good spirits but states that he has been having more frequent episodes of feeling anxious and cold. Pt reports that his BP is elevated during these periods which mostly occur during the afternoon. Pt states this has been going on for over a year and appears to be getting worse. Pt unable to identify any triggers and they are not relieved by rest. Pt report that he has had difficulty sleeping for years and usually only gets ~4 hours per night, but no sleep aids have worked and he gets vivid dreams. Pt denies falls and HA, and only get slightly dizzy during the episodes. Pt believes the lisinopril helps to control the symptoms and is interested in increasing the dose or taking it twice a day. One SBP reading during an episode was in the 190s, however most readings have been SBP 110-140s.  Current HTN meds: amlodipine 10mg  qAM, lisinopril 40mg  qPM, metoprolol XL 25mg  qAM  Previously tried: HCTZ 25mg  daily (worsening renal function)  BP goal: <140/90 mmHg  Family History: CAD (mother, father), DM (mother), HLD (mother), HTN (mother), CVA (mother), sudden death (father)  Social History: rare alcohol use, never smoker  Diet: Breakfast - eggs, crackers, peanut butter and jelly; supper - pasta, sandwich, lots of fruits/vegetables. Pt does not add salt to food. One cup of coffee in morning. Water 2-3 glasses/day  Exercise: limited by hip pain  Home BP readings: last three: BP 131/52 HR 81; BP 152/60 Hr 76; BP 112/44 HR 74  Wt Readings from Last 3 Encounters:  08/14/16 145 lb 12.8 oz  (66.1 kg)  07/26/16 146 lb (66.2 kg)  07/20/16 149 lb 1.9 oz (67.6 kg)   BP Readings from Last 3 Encounters:  08/14/16 122/64  07/26/16 (!) 110/52  07/20/16 122/68   Pulse Readings from Last 3 Encounters:  08/14/16 82  07/26/16 71  07/20/16 (!) 55    Renal function: CrCl cannot be calculated (Patient's most recent lab result is older than the maximum 21 days allowed.).  Past Medical History:  Diagnosis Date  . Aortic valve disorders   . Atherosclerosis of renal artery ([place])   . Bilateral renal artery stenosis ([facility])    documented at the time of angiography by Dr. Albertine Patricia  . BPH (benign prostatic hypertrophy)   . Cerebrovascular disease, unspecified   . Coronary atherosclerosis of unspecified type of vessel, native or graft   . History of cystoscopy    w/ bladder biopsy  . Malignant neoplasm of bladder, part unspecified   . Melanoma in situ of back ([place]) 2012   area of left scapula  . Occlusion and stenosis of carotid artery without mention of cerebral infarction   . Old myocardial infarction   . Other and unspecified hyperlipidemia   . Peripheral vascular disease, unspecified    history of stent left lower extremity placed by Dr. Albertine Patricia  . Postsurgical  aortocoronary bypass status   . Postsurgical percutaneous transluminal coronary angioplasty status   . Scarlet fever   . Unspecified sleep apnea     Current Outpatient Prescriptions on File Prior to Visit  Medication Sig Dispense Refill  . amLODipine (NORVASC) 10 MG tablet Take 1 tablet (10 mg total) by mouth daily. 30 tablet 11  . Ascorbic Acid (VITAMIN C PO) Take 1 capsule by mouth daily.    Marland Kitchen aspirin 325 MG EC tablet Take 325 mg by mouth daily.      . B Complex-C (SUPER B COMPLEX PO) Take 1 tablet by mouth daily.    . cholecalciferol (VITAMIN D) 1000 UNITS tablet Take 1,000 Units by mouth daily.    . fish oil-omega-3 fatty acids 1000 MG capsule Take 1 g by mouth 2 (two) times daily.     Marland Kitchen lisinopril  (PRINIVIL,ZESTRIL) 40 MG tablet TAKE 1 TABLET (40 MG TOTAL) BY MOUTH DAILY. 90 tablet 3  . metoprolol succinate (TOPROL-XL) 25 MG 24 hr tablet TAKE 1 TABLET (25 MG TOTAL) BY MOUTH DAILY. 90 tablet 3  . multivitamin (THERAGRAN) tablet Take 1 tablet by mouth daily.      . nitroGLYCERIN (NITROSTAT) 0.4 MG SL tablet Place 0.4 mg under the tongue every 5 (five) minutes as needed for chest pain (Max 3 doses within 15 minutes call 911).    Marland Kitchen oseltamivir (TAMIFLU) 30 MG capsule Take 1 capsule (30 mg total) by mouth 2 (two) times daily. 10 capsule 0  . simvastatin (ZOCOR) 20 MG tablet TAKE ONE TABLET BY MOUTH AT BEDTIME 90 tablet 2  . tamsulosin (FLOMAX) 0.4 MG CAPS capsule Take 0.8 mg by mouth daily.  10  . tiZANidine (ZANAFLEX) 4 MG tablet Take 1 tablet (4 mg total) by mouth at bedtime as needed for muscle spasms (cramps). 30 tablet 3   No current facility-administered medications on file prior to visit.     Allergies  Allergen Reactions  . Sulfonamide Derivatives Hives     Assessment/Plan:  1. Hypertension - BP currently controlled in clinic at goal < 140/84mmHg. Home BP readings have been somewhat labile with about half of readings being below goal, but given advanced age will not be aggressive and add additional antihypertensives for now. Symptoms appear to be more stress/anxiety-induced rather than related to BP, but pt believes lisinopril helps so will change from 40mg  daily to 20mg  BID as no benefit would occur with increased dose and would put pt at risk of renal AEs. Will continue amlodipine 10mg  daily and metoprolol XL 25mg  daily. Counseled pt on maintaining adequate fluid intake as he currently only drinks 2-3 glasses of water/day. Encouraged pt to discuss symptoms with PCP. Follow-up in clinic in 3 weeks to assess whether symptoms are improved. If symptoms persist, will consider initiating Clonidine 0.1mg  prn SBP >160.  Arrie Senate, PharmD PGY-2 Cardiology Pharmacy Resident Pager:  [phone] 05/07/2017

## 2017-05-07 ENCOUNTER — Ambulatory Visit (INDEPENDENT_AMBULATORY_CARE_PROVIDER_SITE_OTHER): Payer: Medicare Other | Admitting: Pharmacist

## 2017-05-07 DIAGNOSIS — I359 Nonrheumatic aortic valve disorder, unspecified: Secondary | ICD-10-CM | POA: Diagnosis not present

## 2017-05-07 DIAGNOSIS — I25709 Atherosclerosis of coronary artery bypass graft(s), unspecified, with unspecified angina pectoris: Secondary | ICD-10-CM

## 2017-05-07 DIAGNOSIS — I10 Essential (primary) hypertension: Secondary | ICD-10-CM | POA: Diagnosis not present

## 2017-05-07 MED ORDER — LISINOPRIL 20 MG PO TABS: ORAL_TABLET | ORAL | 3 refills | Status: DC

## 2017-05-07 NOTE — Patient Instructions (Signed)
It was great to meet you.  Begin taking your lisinopril 20mg  twice daily.  Continue taking amlodipine 10mg  daily and metoprolol succinate 25mg  once daily.  Follow-up to clinic in 3 weeks.

## 2017-05-28 ENCOUNTER — Ambulatory Visit: Payer: Medicare Other

## 2017-07-18 DIAGNOSIS — N400 Enlarged prostate without lower urinary tract symptoms: Secondary | ICD-10-CM | POA: Diagnosis not present

## 2017-07-18 DIAGNOSIS — Z8551 Personal history of malignant neoplasm of bladder: Secondary | ICD-10-CM | POA: Diagnosis not present

## 2017-07-22 ENCOUNTER — Other Ambulatory Visit: Payer: Self-pay | Admitting: Cardiovascular Disease

## 2017-07-22 DIAGNOSIS — I10 Essential (primary) hypertension: Secondary | ICD-10-CM

## 2017-07-22 DIAGNOSIS — I25709 Atherosclerosis of coronary artery bypass graft(s), unspecified, with unspecified angina pectoris: Secondary | ICD-10-CM

## 2017-07-22 DIAGNOSIS — I359 Nonrheumatic aortic valve disorder, unspecified: Secondary | ICD-10-CM

## 2017-07-27 ENCOUNTER — Other Ambulatory Visit (INDEPENDENT_AMBULATORY_CARE_PROVIDER_SITE_OTHER): Payer: Medicare Other

## 2017-07-27 ENCOUNTER — Ambulatory Visit (INDEPENDENT_AMBULATORY_CARE_PROVIDER_SITE_OTHER): Payer: Medicare Other | Admitting: Internal Medicine

## 2017-07-27 ENCOUNTER — Encounter: Payer: Self-pay | Admitting: Internal Medicine

## 2017-07-27 VITALS — BP 122/64 | HR 70 | Temp 98.0°F | Ht 66.0 in | Wt 134.0 lb

## 2017-07-27 DIAGNOSIS — I25709 Atherosclerosis of coronary artery bypass graft(s), unspecified, with unspecified angina pectoris: Secondary | ICD-10-CM | POA: Diagnosis not present

## 2017-07-27 DIAGNOSIS — R972 Elevated prostate specific antigen [PSA]: Secondary | ICD-10-CM

## 2017-07-27 DIAGNOSIS — E785 Hyperlipidemia, unspecified: Secondary | ICD-10-CM

## 2017-07-27 DIAGNOSIS — Z0000 Encounter for general adult medical examination without abnormal findings: Secondary | ICD-10-CM

## 2017-07-27 DIAGNOSIS — I10 Essential (primary) hypertension: Secondary | ICD-10-CM

## 2017-07-27 DIAGNOSIS — E039 Hypothyroidism, unspecified: Secondary | ICD-10-CM

## 2017-07-27 DIAGNOSIS — I359 Nonrheumatic aortic valve disorder, unspecified: Secondary | ICD-10-CM

## 2017-07-27 LAB — URINALYSIS
BILIRUBIN URINE: NEGATIVE
HGB URINE DIPSTICK: NEGATIVE
KETONES UR: NEGATIVE
Leukocytes, UA: NEGATIVE
Nitrite: NEGATIVE
Specific Gravity, Urine: 1.025 (ref 1.000–1.030)
URINE GLUCOSE: NEGATIVE
UROBILINOGEN UA: 0.2 (ref 0.0–1.0)
pH: 6 (ref 5.0–8.0)

## 2017-07-27 LAB — CBC WITH DIFFERENTIAL/PLATELET
BASOS PCT: 0.6 % (ref 0.0–3.0)
Basophils Absolute: 0 10*3/uL (ref 0.0–0.1)
EOS ABS: 0.1 10*3/uL (ref 0.0–0.7)
Eosinophils Relative: 1 % (ref 0.0–5.0)
HCT: 39.7 % (ref 39.0–52.0)
Hemoglobin: 13.2 g/dL (ref 13.0–17.0)
LYMPHS ABS: 1.6 10*3/uL (ref 0.7–4.0)
Lymphocytes Relative: 21.4 % (ref 12.0–46.0)
MCHC: 33.2 g/dL (ref 30.0–36.0)
MCV: 92 fl (ref 78.0–100.0)
MONO ABS: 0.8 10*3/uL (ref 0.1–1.0)
Monocytes Relative: 9.8 % (ref 3.0–12.0)
NEUTROS PCT: 67.2 % (ref 43.0–77.0)
Neutro Abs: 5.2 10*3/uL (ref 1.4–7.7)
PLATELETS: 218 10*3/uL (ref 150.0–400.0)
RBC: 4.31 Mil/uL (ref 4.22–5.81)
RDW: 13.3 % (ref 11.5–15.5)
WBC: 7.7 10*3/uL (ref 4.0–10.5)

## 2017-07-27 LAB — HEPATIC FUNCTION PANEL
ALBUMIN: 3.8 g/dL (ref 3.5–5.2)
ALT: 8 U/L (ref 0–53)
AST: 14 U/L (ref 0–37)
Alkaline Phosphatase: 50 U/L (ref 39–117)
Bilirubin, Direct: 0.1 mg/dL (ref 0.0–0.3)
TOTAL PROTEIN: 6.1 g/dL (ref 6.0–8.3)
Total Bilirubin: 0.3 mg/dL (ref 0.2–1.2)

## 2017-07-27 LAB — LIPID PANEL
CHOLESTEROL: 154 mg/dL (ref 0–200)
HDL: 34.3 mg/dL — AB (ref >39.00)
LDL CALC: 83 mg/dL (ref 0–99)
NonHDL: 119.85
Total CHOL/HDL Ratio: 4
Triglycerides: 185 mg/dL — ABNORMAL HIGH (ref 0.0–149.0)
VLDL: 37 mg/dL (ref 0.0–40.0)

## 2017-07-27 LAB — BASIC METABOLIC PANEL
BUN: 35 mg/dL — ABNORMAL HIGH (ref 6–23)
CO2: 27 mEq/L (ref 19–32)
CREATININE: 1.72 mg/dL — AB (ref 0.40–1.50)
Calcium: 9 mg/dL (ref 8.4–10.5)
Chloride: 106 mEq/L (ref 96–112)
GFR: 40.24 mL/min — ABNORMAL LOW (ref >60.00)
Glucose, Bld: 117 mg/dL — ABNORMAL HIGH (ref 70–99)
POTASSIUM: 4.9 meq/L (ref 3.5–5.1)
Sodium: 139 mEq/L (ref 135–145)

## 2017-07-27 LAB — TSH: TSH: 3.13 u[IU]/mL (ref 0.35–4.50)

## 2017-07-27 LAB — PSA: PSA: 3.62 ng/mL (ref 0.10–4.00)

## 2017-07-27 MED ORDER — METOPROLOL SUCCINATE ER 25 MG PO TB24: ORAL_TABLET | ORAL | 3 refills | Status: DC

## 2017-07-27 MED ORDER — NITROGLYCERIN 0.4 MG SL SUBL: 0.4000 mg | SUBLINGUAL_TABLET | SUBLINGUAL | 2 refills | Status: DC | PRN

## 2017-07-27 MED ORDER — SIMVASTATIN 20 MG PO TABS: 20.0000 mg | ORAL_TABLET | Freq: Every day | ORAL | 3 refills | Status: DC

## 2017-07-27 MED ORDER — ZOSTER VAC RECOMB ADJUVANTED 50 MCG/0.5ML IM SUSR: 0.5000 mL | Freq: Once | INTRAMUSCULAR | 1 refills | Status: AC

## 2017-07-27 MED ORDER — TAMSULOSIN HCL 0.4 MG PO CAPS: 0.8000 mg | ORAL_CAPSULE | Freq: Every day | ORAL | 3 refills | Status: DC

## 2017-07-27 MED ORDER — AMLODIPINE BESYLATE 10 MG PO TABS: 10.0000 mg | ORAL_TABLET | Freq: Every day | ORAL | 3 refills | Status: DC

## 2017-07-27 MED ORDER — LISINOPRIL 20 MG PO TABS: ORAL_TABLET | ORAL | 3 refills | Status: DC

## 2017-07-27 NOTE — Assessment & Plan Note (Signed)
- 

## 2017-07-27 NOTE — Patient Instructions (Signed)

## 2017-07-27 NOTE — Assessment & Plan Note (Signed)
PSA

## 2017-07-27 NOTE — Assessment & Plan Note (Signed)
ASA, Amlodipine, Toprol, Lisinopril

## 2017-07-27 NOTE — Assessment & Plan Note (Signed)
Labs

## 2017-07-27 NOTE — Assessment & Plan Note (Signed)
   Here for medicare wellness/physical  Diet: heart healthy  Physical activity: not sedentary  Depression/mood screen: negative  Hearing: hearing aids Visual acuity: grossly normal w/glasses, performs annual eye exam  ADLs: capable  Fall risk: low to none  Home safety: good  Cognitive evaluation: intact to orientation, naming, recall and repetition  EOL planning: adv directives, full code/ I agree  I have personally reviewed and have noted  1. The patient's medical, surgical and social history  2. Their use of alcohol, tobacco or illicit drugs  3. Their current medications and supplements  4. The patient's functional ability including ADL's, fall risks, home safety risks and hearing or visual impairment.  5. Diet and physical activities  6. Evidence for depression or mood disorders 7. The roster of all physicians providing medical care to patient - is listed in the Snapshot section of the chart and reviewed today.    Today patient counseled on age appropriate routine health concerns for screening and prevention, each reviewed and up to date or declined. Immunizations reviewed and up to date or declined. Labs ordered and reviewed. Risk factors for depression reviewed and negative. Hearing function and visual acuity are intact. ADLs screened and addressed as needed. Functional ability and level of safety reviewed and appropriate. Education, counseling and referrals performed based on assessed risks today. Patient provided with a copy of personalized plan for preventive services.

## 2017-07-27 NOTE — Progress Notes (Signed)
Subjective:  Patient ID: [name], male    DOB: Sep 13, 1928  Age: 84 y.o. MRN: [id number]  CC: No chief complaint on file.   HPI JSOEPH PODESTA presents for a well exam  Outpatient Medications Prior to Visit  Medication Sig Dispense Refill  . amLODipine (NORVASC) 10 MG tablet Take 1 tablet (10 mg total) by mouth daily. Please call and schedule an appointment for further refills 1st attempt 90 tablet 0  . Ascorbic Acid (VITAMIN C PO) Take 1 capsule by mouth daily.    Marland Kitchen aspirin 325 MG EC tablet Take 325 mg by mouth daily.      . B Complex-C (SUPER B COMPLEX PO) Take 1 tablet by mouth daily.    . cholecalciferol (VITAMIN D) 1000 UNITS tablet Take 1,000 Units by mouth daily.    . fish oil-omega-3 fatty acids 1000 MG capsule Take 1 g by mouth 2 (two) times daily.     Marland Kitchen lisinopril (PRINIVIL,ZESTRIL) 20 MG tablet TAKE 1 TABLET (20 MG TOTAL) BY MOUTH TWICE DAILY. 60 tablet 3  . metoprolol succinate (TOPROL-XL) 25 MG 24 hr tablet TAKE 1 TABLET (25 MG TOTAL) BY MOUTH DAILY. 90 tablet 3  . multivitamin (THERAGRAN) tablet Take 1 tablet by mouth daily.      . nitroGLYCERIN (NITROSTAT) 0.4 MG SL tablet Place 0.4 mg under the tongue every 5 (five) minutes as needed for chest pain (Max 3 doses within 15 minutes call 911).    . simvastatin (ZOCOR) 20 MG tablet TAKE ONE TABLET BY MOUTH AT BEDTIME 90 tablet 2  . tamsulosin (FLOMAX) 0.4 MG CAPS capsule Take 0.8 mg by mouth daily.  10  . oseltamivir (TAMIFLU) 30 MG capsule Take 1 capsule (30 mg total) by mouth 2 (two) times daily. (Patient not taking: Reported on 07/27/2017) 10 capsule 0  . tiZANidine (ZANAFLEX) 4 MG tablet Take 1 tablet (4 mg total) by mouth at bedtime as needed for muscle spasms (cramps). (Patient not taking: Reported on 07/27/2017) 30 tablet 3   No facility-administered medications prior to visit.     ROS Review of Systems  Constitutional: Negative for appetite change, fatigue and unexpected weight change.  HENT: Positive for  hearing loss. Negative for congestion, nosebleeds, sneezing, sore throat and trouble swallowing.   Eyes: Negative for itching and visual disturbance.  Respiratory: Negative for cough.   Cardiovascular: Negative for chest pain, palpitations and leg swelling.  Gastrointestinal: Negative for abdominal distention, blood in stool, diarrhea and nausea.  Genitourinary: Negative for frequency and hematuria.  Musculoskeletal: Negative for back pain, gait problem, joint swelling and neck pain.  Skin: Negative for rash.  Neurological: Negative for dizziness, tremors, speech difficulty and weakness.  Psychiatric/Behavioral: Negative for agitation, dysphoric mood, sleep disturbance and suicidal ideas. The patient is not nervous/anxious.     Objective:  BP 122/64 (BP Location: Left Arm, Patient Position: Sitting, Cuff Size: Normal)   Pulse 70   Temp 98 F (36.7 C) (Oral)   Ht 5\' 6"  (1.676 m)   Wt 134 lb (60.8 kg)   SpO2 98%   BMI 21.63 kg/m   BP Readings from Last 3 Encounters:  07/27/17 122/64  05/07/17 (!) 138/56  08/14/16 122/64    Wt Readings from Last 3 Encounters:  07/27/17 134 lb (60.8 kg)  08/14/16 145 lb 12.8 oz (66.1 kg)  07/26/16 146 lb (66.2 kg)    Physical Exam  Constitutional: He is oriented to person, place, and time. He appears well-developed. No distress.  NAD  HENT:  Mouth/Throat: Oropharynx is clear and moist.  Eyes: Conjunctivae are normal. Pupils are equal, round, and reactive to light.  Neck: Normal range of motion. No JVD present. No thyromegaly present.  Cardiovascular: Normal rate, regular rhythm, normal heart sounds and intact distal pulses. Exam reveals no gallop and no friction rub.  No murmur heard. Pulmonary/Chest: Effort normal and breath sounds normal. No respiratory distress. He has no wheezes. He has no rales. He exhibits no tenderness.  Abdominal: Soft. Bowel sounds are normal. He exhibits no distension and no mass. There is no tenderness. There is no  rebound and no guarding.  Musculoskeletal: Normal range of motion. He exhibits no edema or tenderness.  Lymphadenopathy:    He has no cervical adenopathy.  Neurological: He is alert and oriented to person, place, and time. He has normal reflexes. No cranial nerve deficit. He exhibits normal muscle tone. He displays a negative Romberg sign. Coordination and gait normal.  Skin: Skin is warm and dry. No rash noted.  Psychiatric: He has a normal mood and affect. His behavior is normal. Judgment and thought content normal.  wax Rectal per Urol  Lab Results  Component Value Date   WBC 7.6 07/26/2016   HGB 13.9 07/26/2016   HCT 40.8 07/26/2016   PLT 163.0 07/26/2016   GLUCOSE 102 (H) 07/26/2016   CHOL 176 07/26/2016   TRIG 112.0 07/26/2016   HDL 48.70 07/26/2016   LDLCALC 105 (H) 07/26/2016   ALT 12 07/26/2016   AST 18 07/26/2016   NA 145 07/26/2016   K 5.0 07/26/2016   CL 111 07/26/2016   CREATININE 1.40 07/26/2016   BUN 32 (H) 07/26/2016   CO2 25 07/26/2016   TSH 3.09 07/26/2016   PSA 2.56 07/26/2016   INR 1.49 04/18/2010   HGBA1C 5.7 03/11/2014    No results found.  Assessment & Plan:   There are no diagnoses linked to this encounter. I have discontinued Pilar Plate B. Bruinsma's tiZANidine and oseltamivir. I am also having him maintain his aspirin, fish oil-omega-3 fatty acids, multivitamin, Ascorbic Acid (VITAMIN C PO), B Complex-C (SUPER B COMPLEX PO), cholecalciferol, tamsulosin, nitroGLYCERIN, metoprolol succinate, simvastatin, lisinopril, and amLODipine.  No orders of the defined types were placed in this encounter.    Follow-up: No Follow-up on file.  Walker Kehr, MD

## 2017-07-28 ENCOUNTER — Other Ambulatory Visit: Payer: Self-pay | Admitting: Internal Medicine

## 2017-07-28 DIAGNOSIS — I701 Atherosclerosis of renal artery: Secondary | ICD-10-CM

## 2017-09-24 ENCOUNTER — Ambulatory Visit: Payer: Medicare Other | Admitting: Cardiovascular Disease

## 2017-09-26 ENCOUNTER — Ambulatory Visit: Payer: Medicare Other | Admitting: Physician Assistant

## 2017-09-26 ENCOUNTER — Encounter: Payer: Self-pay | Admitting: Physician Assistant

## 2017-09-26 VITALS — BP 130/58 | HR 54 | Ht 66.0 in | Wt 132.0 lb

## 2017-09-26 DIAGNOSIS — I779 Disorder of arteries and arterioles, unspecified: Secondary | ICD-10-CM | POA: Diagnosis not present

## 2017-09-26 DIAGNOSIS — I10 Essential (primary) hypertension: Secondary | ICD-10-CM | POA: Diagnosis not present

## 2017-09-26 DIAGNOSIS — I350 Nonrheumatic aortic (valve) stenosis: Secondary | ICD-10-CM

## 2017-09-26 DIAGNOSIS — I701 Atherosclerosis of renal artery: Secondary | ICD-10-CM

## 2017-09-26 DIAGNOSIS — I2510 Atherosclerotic heart disease of native coronary artery without angina pectoris: Secondary | ICD-10-CM

## 2017-09-26 DIAGNOSIS — I739 Peripheral vascular disease, unspecified: Secondary | ICD-10-CM | POA: Diagnosis not present

## 2017-09-26 MED ORDER — ASPIRIN EC 81 MG PO TBEC: 81.0000 mg | DELAYED_RELEASE_TABLET | Freq: Every day | ORAL | 3 refills | Status: AC

## 2017-09-26 NOTE — Progress Notes (Signed)
Cardiology Office Note:    Date:  09/26/2017   ID:  [name], DOB January 28, 1929, MRN [id number]  PCP:  Ricky Anger, MD  Cardiologist:  Ricky Mocha, MD   Referring MD: Ricky Anger, MD   Chief Complaint  Patient presents with  . Follow-up    aortic stenosis, CAD, PAD    History of Present Illness:    [name] is a 84 y.o. male with coronary artery disease status post CABG in 1995, status post PCI/stenting to the SVG-OM in 2008, aortic stenosis status post bioprosthetic aortic valve replacement in 2011, peripheral vascular disease s/p remote L lower extremity stenting, renal artery stenosis.  He has known moderate aortic stenosis of the prosthetic valve.  Last seen in clinic by [facility] PA-C in 07/2016.    [name] returns for follow up.  He is here with his wife.  He is doing well without chest pain, paroxysmal nocturnal dyspnea, edema, syncope.  He has shortness of breath with moderate to extreme activities.  This is unchanged.  He has frequent bruising but no melena, hematochezia, hematuria.    Prior CV studies:   The following studies were reviewed today:  Echo 08/29/16 Mild LVH, mild focal basal septal hypertrophy, EF 55-60, normal wall motion, grade 2 diastolic dysfunction, bioprosthetic AVR with moderate aortic stenosis (mean 33, peak 57), MAC, moderate MR, severe LAE  ABI/LE arterial Doppler 1/18 Right ABI has decreased compared to prior exam, now in mild range of flow reduction. Left ABI is normal and stable, s/p left SFA stent and left popliteal artery PTA. Amplitudes of CW waveforms are higher, bilaterally. Right great toe-brachial index is abnormal. Left great toe-brachial index is normal. Heterogeneous plaque in the left lower extremity. Progression of disease, without focal stenosis, in the proximal left SFA, now in the low 50-74% range, s/p stent. Three vessel run-off noted on the 12/15 bilateral lower extremity duplex  report. Follow-up 1 year  Carotid US 08/08/16 R 40-59; L1-39 Follow-up 1 year  Echo 07/27/14 Mild LVH, EF 60-65, normal wall motion, grade 1 diastolic dysfunction, bioprosthetic AVR with moderate aortic stenosis (mean 34), mild MR, mild LAE, normal RV SF  Renal artery [place] 10/15 Normal caliber abdominal aorta, >50 distal aorta stenosis, >70 proximal SMA stenosis Bilateral RAS 1-59  Cardiac catheterization 8/11 LM normal LAD proximal-mid 70-80, mid 20, D2 occluded LCx mid mild plaque, OM2 occluded RCA mid to distal occluded LIMA-LAD atretic SVG-DX patent SVG-OM patent, proximal stent patent with 30 ISR SVG-PDA patent EF 60-65  Nuclear stress test 4/11 Inferolateral scar, no ischemia, EF 63  Past Medical History:  Diagnosis Date  . Aortic valve disorders   . Atherosclerosis of renal artery ([place])   . Bilateral renal artery stenosis (HCC)    documented at the time of angiography by Dr. Albertine Delacruz  . BPH (benign prostatic hypertrophy)   . Cerebrovascular disease, unspecified   . Coronary atherosclerosis of unspecified type of vessel, native or graft   . History of cystoscopy    w/ bladder biopsy  . Malignant neoplasm of bladder, part unspecified   . Melanoma in situ of back ([place]) 2012   area of left scapula  . Occlusion and stenosis of carotid artery without mention of cerebral infarction   . Old myocardial infarction   . Other and unspecified hyperlipidemia   . Peripheral vascular disease, unspecified ([place])    history of stent left lower extremity placed by Dr. Albertine Delacruz  . Postsurgical aortocoronary bypass status   .  Postsurgical percutaneous transluminal coronary angioplasty status   . Scarlet fever   . Unspecified sleep apnea     Past Surgical History:  Procedure Laterality Date  . CORONARY ARTERY BYPASS GRAFT    . CYSTOSCOPY     w/ bladder biopsy  . MELANOMA EXCISION  2012   left scapular  area - clean margins.   . Percutaneous transluminal coronary angioplasty    .  redo median sternotomy     extracorporeal circulation, aortic valve replacement using a 23-mm Mitroflow aortic pericardial heart valve.  Surgeon- Ricky Raid, MD 04-18-10    Current Medications: Current Meds  Medication Sig  . amLODipine (NORVASC) 10 MG tablet Take 1 tablet (10 mg total) by mouth daily. Please call and schedule an appointment for further refills 1st attempt  . Ascorbic Acid (VITAMIN C PO) Take 1 capsule by mouth daily.  . B Complex-C (SUPER B COMPLEX PO) Take 1 tablet by mouth daily.  . cholecalciferol (VITAMIN D) 1000 UNITS tablet Take 1,000 Units by mouth daily.  . fish oil-omega-3 fatty acids 1000 MG capsule Take 1 g by mouth 2 (two) times daily.   Marland Kitchen lisinopril (PRINIVIL,ZESTRIL) 20 MG tablet TAKE 1 TABLET (20 MG TOTAL) BY MOUTH TWICE DAILY.  . Magnesium 400 MG TABS Take 400 mg by mouth daily.  . metoprolol succinate (TOPROL-XL) 25 MG 24 hr tablet TAKE 1 TABLET (25 MG TOTAL) BY MOUTH DAILY.  . multivitamin (THERAGRAN) tablet Take 1 tablet by mouth daily.    . nitroGLYCERIN (NITROSTAT) 0.4 MG SL tablet Place 1 tablet (0.4 mg total) under the tongue every 5 (five) minutes as needed for chest pain (Max 3 doses within 15 minutes call 911).  . simvastatin (ZOCOR) 20 MG tablet Take 1 tablet (20 mg total) by mouth at bedtime.  . tamsulosin (FLOMAX) 0.4 MG CAPS capsule Take 2 capsules (0.8 mg total) by mouth daily.  . [DISCONTINUED] aspirin 325 MG EC tablet Take 325 mg by mouth daily.       Allergies:   Sulfonamide derivatives   Social History   Tobacco Use  . Smoking status: Never Smoker  . Smokeless tobacco: Never Used  Substance Use Topics  . Alcohol use: Yes    Comment: rare  . Drug use: No     Family Hx: The patient's family history includes Cancer in his other; Coronary artery disease in his father and mother; Diabetes in his mother; Heart attack in his father; Heart disease in his father and mother; Hyperlipidemia in his mother; Hypertension in his mother;  Stroke in his mother; Sudden death in his father. There is no history of Birth defects.  ROS:   Please see the history of present illness.    Review of Systems  Cardiovascular: Positive for dyspnea on exertion.  Hematologic/Lymphatic: Positive for bleeding problem. Bruises/bleeds easily.  Psychiatric/Behavioral: Positive for depression. The patient is nervous/anxious.    All other systems reviewed and are negative.   EKGs/Labs/Other Test Reviewed:    EKG:  EKG is  ordered today.  The ekg ordered today demonstrates sinus bradycardia, heart rate 53, right bundle branch block, left axis deviation, QTC 444, no change from prior tracing  Recent Labs: 07/27/2017: ALT 8; BUN 35; Creatinine, Ser 1.72; Hemoglobin 13.2; Platelets 218.0; Potassium 4.9; Sodium 139; TSH 3.13   Recent Lipid Panel Lab Results  Component Value Date/Time   CHOL 154 07/27/2017 02:51 PM   TRIG 185.0 (H) 07/27/2017 02:51 PM   HDL 34.30 (L) 07/27/2017 02:51 PM  CHOLHDL 4 07/27/2017 02:51 PM   LDLCALC 83 07/27/2017 02:51 PM    Physical Exam:    VS:  BP (!) 130/58   Pulse (!) 54   Ht _0  (1.676 m)   Wt 132 lb (59.9 kg)   SpO2 97%   BMI 21.31 kg/m     Wt Readings from Last 3 Encounters:  09/26/17 132 lb (59.9 kg)  07/27/17 134 lb (60.8 kg)  08/14/16 145 lb 12.8 oz (66.1 kg)     Physical Exam  Constitutional: He is oriented to person, place, and time. He appears well-developed and well-nourished. No distress.  HENT:  Head: Normocephalic and atraumatic.  Neck: No JVD present.  Cardiovascular: Normal rate and regular rhythm.  Murmur heard.  Harsh crescendo-decrescendo systolic murmur is present with a grade of 2/6. Pulmonary/Chest: He has no rales.  Abdominal: Soft.  Musculoskeletal: He exhibits no edema.  Neurological: He is alert and oriented to person, place, and time.  Skin: Skin is warm and dry.    ASSESSMENT & PLAN:    #1.  Coronary artery disease  History of remote CABG in 1995 and  subsequent stenting of the vein graft to the obtuse marginal in 2008.  Cardiac catheterization in 2011 demonstrated an atretic LIMA-LAD and patent vein graft to the diagonal, patent vein graft to the obtuse marginal and patent vein graft to the PDA.  He is currently not having any anginal symptoms.  He has remained on aspirin 325 for many years.  He has no indication to remain on high-dose aspirin and this can be reduced to 81 mg.  Continue simvastatin.  #2.  Aortic valve stenosis Status post bioprosthetic aortic valve replacement in 2011.  Echo in 2018 demonstrated moderate aortic stenosis.  Continue SBE prophylaxis.  We discussed the warning signs of severe aortic stenosis.  -Arrange follow-up echocardiogram  #3.  Bilateral carotid artery disease, unspecified type ([place]) He is due for follow-up carotid US.  This will be arranged.  Continue aspirin, statin.  #4.  Essential hypertension The patient's blood pressure is controlled on his current regimen.  Continue current therapy.   #5.  PAD (peripheral artery disease) ([place]) History of remote lower extremity arterial stenting.  ABIs and arterial Dopplers in 2018 were fairly stable.  He is due for follow-up study.  This will be arranged.  #6.  Renal artery stenosis (HCC) Creatinine has remained stable.  His blood pressure is well controlled.  Continue current medical therapy.   Dispo:  Return in about 6 months (around 03/29/2018) for Routine Follow Up, w/ Dr. Burt Knack, or Richardson Dopp, PA-C.   Medication Adjustments/Labs and Tests Ordered: Current medicines are reviewed at length with the patient today.  Concerns regarding medicines are outlined above.  Tests Ordered: Orders Placed This Encounter  Procedures  . EKG 12-Lead  . ECHOCARDIOGRAM COMPLETE   Medication Changes: Meds ordered this encounter  Medications  . aspirin EC 81 MG tablet    Sig: Take 1 tablet (81 mg total) by mouth daily.    Dispense:  90 tablet    Refill:  3    Order  Specific Question:   Supervising Provider    Answer:   Lauree Chandler D [[id number]]    Signed, Richardson Dopp, PA-C  09/26/2017 4:24 PM    [facility] [address], [place], [place]  [address] Phone: [phone]; Fax: [phone]

## 2017-09-26 NOTE — Patient Instructions (Signed)
Medication Instructions:  DECREASE ASPIRIN TO 81 MG DAILY  Labwork: NONE ORDERED TODAY   Testing/Procedures: 1. Your physician has requested that you have an echocardiogram. Echocardiography is a painless test that uses sound waves to create images of your heart. It provides your doctor with information about the size and shape of your heart and how well your heart's chambers and valves are working. This procedure takes approximately one hour. There are no restrictions for this procedure.  2. Your physician has requested that you have a lower extremity arterial exercise duplex. During this test, exercise and ultrasound are used to evaluate arterial blood flow in the legs. Allow one hour for this exam. There are no restrictions or special instructions. THIS IS TO BE DONE WITH ABI'S   3. Your physician has requested that you have a carotid duplex. This test is an ultrasound of the carotid arteries in your neck. It looks at blood flow through these arteries that supply the brain with blood. Allow one hour for this exam. There are no restrictions or special instructions.    Follow-Up: Your physician wants you to follow-up in: 6 MONTHS WITH DR. Emelda Fear will receive a reminder letter in the mail two months in advance. If you don't receive a letter, please call our office to schedule the follow-up appointment.   Any Other Special Instructions Will Be Listed Below (If Applicable).     If you need a refill on your cardiac medications before your next appointment, please call your pharmacy.

## 2017-10-05 ENCOUNTER — Encounter: Payer: Self-pay | Admitting: Physician Assistant

## 2017-10-05 ENCOUNTER — Other Ambulatory Visit: Payer: Self-pay

## 2017-10-05 ENCOUNTER — Ambulatory Visit (HOSPITAL_COMMUNITY): Payer: Medicare Other | Attending: Cardiovascular Disease

## 2017-10-05 DIAGNOSIS — I2510 Atherosclerotic heart disease of native coronary artery without angina pectoris: Secondary | ICD-10-CM | POA: Insufficient documentation

## 2017-10-05 DIAGNOSIS — I350 Nonrheumatic aortic (valve) stenosis: Secondary | ICD-10-CM

## 2017-10-05 DIAGNOSIS — I080 Rheumatic disorders of both mitral and aortic valves: Secondary | ICD-10-CM | POA: Diagnosis not present

## 2017-10-10 ENCOUNTER — Telehealth: Payer: Self-pay | Admitting: *Deleted

## 2017-10-10 ENCOUNTER — Encounter: Payer: Self-pay | Admitting: Physician Assistant

## 2017-10-10 DIAGNOSIS — I350 Nonrheumatic aortic (valve) stenosis: Secondary | ICD-10-CM

## 2017-10-10 DIAGNOSIS — I359 Nonrheumatic aortic valve disorder, unspecified: Secondary | ICD-10-CM

## 2017-10-10 NOTE — Telephone Encounter (Signed)
F/U call: ° °Patient returning call  °

## 2017-10-10 NOTE — Telephone Encounter (Signed)
Left message to go over echo results and recommendations.  

## 2017-10-10 NOTE — Telephone Encounter (Signed)
Pt has been notified of echo results and recommendations to have Echo in yr and a f/u with Dr. Burt Knack in 6 months. Pt thanked me for my call and our help.

## 2017-10-10 NOTE — Telephone Encounter (Signed)
-----   Message from Liliane Shi, [place] sent at 10/10/2017  2:21 PM EDT ----- Please call the patient. Echocardiogram demonstrates normal LV function.  Bioprosthetic aortic valve gradients remain stable.  I reviewed this with Dr. Burt Knack who agreed.  Please make sure the patient has a follow-up appointment in 6 months and repeat echo in 1 year. Please fax a copy of this study result to his PCP:  Plotnikov, Evie Lacks, MD  Thanks! Richardson Dopp, PA-C    10/10/2017 2:19 PM

## 2017-10-12 ENCOUNTER — Ambulatory Visit (HOSPITAL_COMMUNITY)
Admission: RE | Admit: 2017-10-12 | Discharge: 2017-10-12 | Disposition: A | Discharge status: Home / Self Care | Payer: Medicare Other | Source: Ambulatory Visit | Attending: Cardiology | Admitting: Cardiology

## 2017-10-12 ENCOUNTER — Telehealth: Payer: Self-pay | Admitting: *Deleted

## 2017-10-12 ENCOUNTER — Ambulatory Visit (HOSPITAL_BASED_OUTPATIENT_CLINIC_OR_DEPARTMENT_OTHER)
Admission: RE | Admit: 2017-10-12 | Discharge: 2017-10-12 | Disposition: A | Discharge status: Home / Self Care | Payer: Medicare Other | Source: Ambulatory Visit | Attending: Cardiology | Admitting: Cardiology

## 2017-10-12 ENCOUNTER — Encounter: Payer: Self-pay | Admitting: Physician Assistant

## 2017-10-12 DIAGNOSIS — E785 Hyperlipidemia, unspecified: Secondary | ICD-10-CM | POA: Insufficient documentation

## 2017-10-12 DIAGNOSIS — Z95828 Presence of other vascular implants and grafts: Secondary | ICD-10-CM | POA: Diagnosis not present

## 2017-10-12 DIAGNOSIS — I10 Essential (primary) hypertension: Secondary | ICD-10-CM | POA: Diagnosis not present

## 2017-10-12 DIAGNOSIS — I779 Disorder of arteries and arterioles, unspecified: Secondary | ICD-10-CM

## 2017-10-12 DIAGNOSIS — I252 Old myocardial infarction: Secondary | ICD-10-CM | POA: Insufficient documentation

## 2017-10-12 DIAGNOSIS — I739 Peripheral vascular disease, unspecified: Secondary | ICD-10-CM | POA: Insufficient documentation

## 2017-10-12 DIAGNOSIS — I2510 Atherosclerotic heart disease of native coronary artery without angina pectoris: Secondary | ICD-10-CM | POA: Insufficient documentation

## 2017-10-12 DIAGNOSIS — Z87891 Personal history of nicotine dependence: Secondary | ICD-10-CM | POA: Insufficient documentation

## 2017-10-12 NOTE — Telephone Encounter (Signed)
-----   Message from Liliane Shi, [place] sent at 10/12/2017  1:59 PM EDT ----- Please call the patient. The carotid ultrasound demonstrates moderate bilateral plaque (40-59%) in the internal carotid arteries.  Repeat carotid US in 1 year. Please fax a copy of this study result to his PCP:  Plotnikov, Evie Lacks, MD  Thanks! Richardson Dopp, PA-C    10/12/2017 1:58 PM

## 2017-10-12 NOTE — Telephone Encounter (Signed)
Left message to go over carotid results. I will forward a copy of results to Dr. Jeannette How.

## 2017-10-15 ENCOUNTER — Other Ambulatory Visit: Payer: Self-pay | Admitting: Cardiovascular Disease

## 2017-10-15 DIAGNOSIS — I739 Peripheral vascular disease, unspecified: Secondary | ICD-10-CM

## 2017-10-15 NOTE — Telephone Encounter (Signed)
-----   Message from Michae Kava, [place] sent at 10/15/2017  4:38 PM EDT ----- Regarding: Lisinopril dose per Dr. Judi Cong,   I s/w pt today and went over carotid results. After going over results pt asked me to bring something to your attention. He states that Dr. Alain Marion would like for him to decrease his Lisinopril due to kidney function. Pt is currently taking Lisinopril 20 mg BID. See note from Einar Grad, [facility] on 05/07/17. Pt was taking Lisinopril 40 mg daily; though pt has anxiety and feels better when he takes the Lisinopril.   The 05/07/17 note shows where he was changed over Lisinopril 20 mg BID. Pt states he does not want decrease Lisinopril because he is feeling fine. Pt would like if you would be able to d/w Dr. Alain Marion as to the dose of Lisinopril.   Please advise.  Thank you Arbie Cookey

## 2017-10-15 NOTE — Telephone Encounter (Signed)
Have him come in in the next 2 weeks for a repeat BMET. If his renal function continues to worsen, we will need to decrease the dose. Richardson Dopp, PA-C    10/15/2017 10:55 PM

## 2017-10-16 NOTE — Telephone Encounter (Signed)
I tried to reach the pt to go over recommendation for BMET to be done in 2 weeks per Richardson Dopp, PAC. See note from yesterday 10/15/17. Line was busy. I will try again later.

## 2017-10-17 NOTE — Telephone Encounter (Signed)
BMET TO BE DONE IN 2 WEEKS PER SCOTT WEAVER,PAC. I lmtcb to advise of recommendation per Richardson Dopp, PA to have BMET done in 2 weeks to check kidney function.

## 2017-10-18 ENCOUNTER — Telehealth: Payer: Self-pay | Admitting: Physician Assistant

## 2017-10-18 NOTE — Telephone Encounter (Signed)
Spoke with pt and made an appointment for him to have his repeat BMP on 4/16. He had no additional questions.

## 2017-10-18 NOTE — Telephone Encounter (Signed)
Follow Up:    Returning Carol's call from yesterday,pt have some questions.

## 2017-10-30 ENCOUNTER — Other Ambulatory Visit: Payer: Medicare Other | Admitting: *Deleted

## 2017-10-30 DIAGNOSIS — I10 Essential (primary) hypertension: Secondary | ICD-10-CM | POA: Diagnosis not present

## 2017-10-30 LAB — BASIC METABOLIC PANEL
BUN / CREAT RATIO: 19 (ref 10–24)
BUN: 30 mg/dL — AB (ref 8–27)
CALCIUM: 9.2 mg/dL (ref 8.6–10.2)
CHLORIDE: 107 mmol/L — AB (ref 96–106)
CO2: 25 mmol/L (ref 20–29)
Creatinine, Ser: 1.62 mg/dL — ABNORMAL HIGH (ref 0.76–1.27)
GFR calc Af Amer: 44 mL/min/{1.73_m2} — ABNORMAL LOW (ref >59)
GFR calc non Af Amer: 38 mL/min/{1.73_m2} — ABNORMAL LOW (ref >59)
GLUCOSE: 110 mg/dL — AB (ref 65–99)
Potassium: 4.9 mmol/L (ref 3.5–5.2)
Sodium: 142 mmol/L (ref 134–144)

## 2017-11-22 DIAGNOSIS — Z85820 Personal history of malignant melanoma of skin: Secondary | ICD-10-CM | POA: Diagnosis not present

## 2017-11-22 DIAGNOSIS — Z85828 Personal history of other malignant neoplasm of skin: Secondary | ICD-10-CM | POA: Diagnosis not present

## 2017-11-22 DIAGNOSIS — D692 Other nonthrombocytopenic purpura: Secondary | ICD-10-CM | POA: Diagnosis not present

## 2017-11-22 DIAGNOSIS — D0439 Carcinoma in situ of skin of other parts of face: Secondary | ICD-10-CM | POA: Diagnosis not present

## 2017-11-22 DIAGNOSIS — D1801 Hemangioma of skin and subcutaneous tissue: Secondary | ICD-10-CM | POA: Diagnosis not present

## 2017-12-07 ENCOUNTER — Other Ambulatory Visit: Payer: Self-pay | Admitting: *Deleted

## 2017-12-07 DIAGNOSIS — I779 Disorder of arteries and arterioles, unspecified: Secondary | ICD-10-CM

## 2017-12-07 DIAGNOSIS — I739 Peripheral vascular disease, unspecified: Principal | ICD-10-CM

## 2018-01-11 ENCOUNTER — Ambulatory Visit (HOSPITAL_COMMUNITY)
Admission: EM | Admit: 2018-01-11 | Discharge: 2018-01-11 | Disposition: A | Discharge status: Home / Self Care | Payer: Medicare Other | Attending: Family Medicine | Admitting: Family Medicine

## 2018-01-11 ENCOUNTER — Encounter (HOSPITAL_COMMUNITY): Payer: Self-pay | Admitting: Emergency Medicine

## 2018-01-11 DIAGNOSIS — S51012A Laceration without foreign body of left elbow, initial encounter: Secondary | ICD-10-CM

## 2018-01-11 NOTE — ED Provider Notes (Signed)
[name]    CSN: [id number] Arrival date & time: 01/11/18  1120     History   Chief Complaint No chief complaint on file.   HPI [name] is a 84 y.o. male.   HPI  [name] is here for an elbow injury.  He hit his elbow yesterday while working in the yard. Bleeding controlled with pressure.  Patient states that it is gotten older from his skin is very thin and tears easily.  He is also on blood thinners.  He and his wife wrapped it up.  When he woke up this morning he noticed that the edges had shifted and it was open again.  He like to have this looked at.  No current bleeding.  No pain.  No drainage.  No sign of infection.  Past Medical History:  Diagnosis Date  . Aortic valve disorders    Echo 3/19: Mild LVH, EF 65-70, dynamic obstruction during Valsalva in mid cavity with mid cavity obliteration (peak velocity 200 cm/s, peak gradient 16 mmHg), normal wall motion, grade 1 diastolic dysfunction, AVR with mild stenosis (mean gradient 32, peak 56), MAC, mild to moderate MR, mild LAE  . Atherosclerosis of renal artery ([place])   . Bilateral renal artery stenosis (H[facility])    documented at the time of angiography by Dr. Albertine Delacruz  . BPH (benign prostatic hypertrophy)   . Carotid artery disease ([place])    Carotid US 3/19: Bilateral ICA 40-59, follow-up 1 year  . Cerebrovascular disease, unspecified   . Coronary atherosclerosis of unspecified type of vessel, native or graft   . History of cystoscopy    w/ bladder biopsy  . History of echocardiogram    Echo 3/19: mild LVH, EF 65-70, dynamic obstruction during Valsalva with mid cavity obliteration (peak velocity of 200 cm/sec - peak gradient 16 mmHg), no RWMA, Gr 1 DD, s/p AVR, mild AS (mean 32, peak 56), MAC, mild to mod MR, mild LAE  . Malignant neoplasm of bladder, part unspecified   . Melanoma in situ of back ([place]) 2012   area of left scapula  . Old myocardial infarction   . Other and unspecified hyperlipidemia   .  Peripheral vascular disease, unspecified ([place])    history of stent left lower extremity placed by Dr. Albertine Delacruz  . Postsurgical aortocoronary bypass status   . Postsurgical percutaneous transluminal coronary angioplasty status   . Scarlet fever   . Unspecified sleep apnea     Patient Active Problem List   Diagnosis Date Noted  . Hearing loss d/t noise 01/24/2016  . Elevated PSA 07/22/2015  . Urinary tract infection, site not specified 07/21/2015  . Edema 09/02/2014  . Well adult exam 03/11/2014  . Cramps of lower extremity 11/10/2013  . Cervical disc disease 09/25/2012  . Hypertension 06/11/2012  . Bradycardia 04/17/2012  . Need for prophylactic vaccination with tetanus-diphtheria (Td) 02/08/2012  . Hypothyroidism 01/10/2011  . TMJ SYNDROME 06/08/2010  . RBBB 10/15/2009  . Aortic stenosis 12/11/2008  . Malignant neoplasm of bladder ([place]) 08/27/2007  . Dyslipidemia 08/27/2007  . MYOCARDIAL INFARCTION, HX OF 08/27/2007  . CAD (coronary artery disease) 08/27/2007  . Carotid artery disease ([place]) 08/27/2007  . Cerebrovascular disease 08/27/2007  . RENAL ARTERY STENOSIS 08/27/2007  . Peripheral vascular disease ([place]) 08/27/2007  . SLEEP APNEA 08/27/2007  . BENIGN PROSTATIC HYPERTROPHY, HX OF 08/27/2007  . CORONARY ARTERY BYPASS GRAFT, HX OF 08/27/2007  . PERCUTANEOUS TRANSLUMINAL CORONARY ANGIOPLASTY, HX OF 08/27/2007  Past Surgical History:  Procedure Laterality Date  . CORONARY ARTERY BYPASS GRAFT    . CYSTOSCOPY     w/ bladder biopsy  . MELANOMA EXCISION  2012   left scapular  area - clean margins.   . Percutaneous transluminal coronary angioplasty    . redo median sternotomy     extracorporeal circulation, aortic valve replacement using a 23-mm Mitroflow aortic pericardial heart valve.  Surgeon- Ricky Raid, MD 04-18-10       Home Medications    Prior to Admission medications   Medication Sig Start Date End Date Taking? Authorizing Provider  amLODipine (NORVASC)  10 MG tablet Take 1 tablet (10 mg total) by mouth daily. Please call and schedule an appointment for further refills 1st attempt 07/27/17   Delacruz, Ricky Lacks, MD  Ascorbic Acid (VITAMIN C PO) Take 1 capsule by mouth daily.    [provider]  aspirin EC 81 MG tablet Take 1 tablet (81 mg total) by mouth daily. 09/26/17   [name] T, PA-C  B Complex-C (SUPER B COMPLEX PO) Take 1 tablet by mouth daily.    [provider]  cholecalciferol (VITAMIN D) 1000 UNITS tablet Take 1,000 Units by mouth daily.    [provider]  fish oil-omega-3 fatty acids 1000 MG capsule Take 1 g by mouth 2 (two) times daily.     [provider]  lisinopril (PRINIVIL,ZESTRIL) 20 MG tablet TAKE 1 TABLET (20 MG TOTAL) BY MOUTH TWICE DAILY. 07/27/17   Delacruz, Ricky Lacks, MD  Magnesium 400 MG TABS Take 400 mg by mouth daily.    [provider]  metoprolol succinate (TOPROL-XL) 25 MG 24 hr tablet TAKE 1 TABLET (25 MG TOTAL) BY MOUTH DAILY. 07/27/17   Delacruz, Ricky Lacks, MD  multivitamin [facility]) tablet Take 1 tablet by mouth daily.      [provider]  nitroGLYCERIN (NITROSTAT) 0.4 MG SL tablet Place 1 tablet (0.4 mg total) under the tongue every 5 (five) minutes as needed for chest pain (Max 3 doses within 15 minutes call 911). 07/27/17   Delacruz, Ricky Lacks, MD  simvastatin (ZOCOR) 20 MG tablet Take 1 tablet (20 mg total) by mouth at bedtime. 07/27/17   Delacruz, Ricky Lacks, MD  tamsulosin (FLOMAX) 0.4 MG CAPS capsule Take 2 capsules (0.8 mg total) by mouth daily. 07/27/17   Delacruz, Ricky Lacks, MD    Family History Family History  Problem Relation Age of Onset  . Hypertension Mother   . Diabetes Mother   . Hyperlipidemia Mother   . Coronary artery disease Mother   . Heart disease Mother        CAD, cardiomyopathy  . Stroke Mother   . Coronary artery disease Father   . Sudden death Father        1 wk post MI  . Heart attack Father   . Heart disease  Father        CAD/MI-sudden death  . Cancer Other        prostate  . Birth defects Neg Hx     Social History Social History   Tobacco Use  . Smoking status: Never Smoker  . Smokeless tobacco: Never Used  Substance Use Topics  . Alcohol use: Yes    Comment: rare  . Drug use: No     Allergies   Sulfonamide derivatives   Review of Systems Review of Systems  Constitutional: Negative for chills and fever.  HENT: Negative for ear pain and sore throat.  Eyes: Negative for pain and visual disturbance.  Respiratory: Negative for cough and shortness of breath.   Cardiovascular: Negative for chest pain and palpitations.  Gastrointestinal: Negative for abdominal pain and vomiting.  Genitourinary: Negative for dysuria and hematuria.  Musculoskeletal: Negative for arthralgias and back pain.  Skin: Positive for wound. Negative for color change and rash.  Neurological: Negative for seizures and syncope.  All other systems reviewed and are negative.    Physical Exam Triage Vital Signs ED Triage Vitals [01/11/18 1131]  Enc Vitals Group     BP (!) 103/55     Pulse Rate (!) 59     Resp 18     Temp 98.2 F (36.8 C)     Temp src      SpO2 98 %     Weight      Height      Head Circumference      Peak Flow      Pain Score      Pain Loc      Pain Edu?      Excl. in [place]?    No data found.  Updated Vital Signs BP (!) 103/55   Pulse (!) 59   Temp 98.2 F (36.8 C)   Resp 18   SpO2 98%   Visual Acuity Right Eye Distance:   Left Eye Distance:   Bilateral Distance:    Right Eye Near:   Left Eye Near:    Bilateral Near:     Physical Exam  Constitutional: He appears well-developed and well-nourished. No distress.  HENT:  Head: Normocephalic and atraumatic.  Mouth/Throat: Oropharynx is clear and moist.  Eyes: Pupils are equal, round, and reactive to light. Conjunctivae are normal.  Neck: Normal range of motion.  Cardiovascular: Normal rate.  Pulmonary/Chest: Effort  normal. No respiratory distress.  Abdominal: Soft. He exhibits no distension.  Musculoskeletal: Normal range of motion. He exhibits no edema.  Neurological: He is alert.  Skin: Skin is warm and dry.  Left elbow just distal to the olecranon process, ulnar aspect, irregular slightly C shaped 7 cm skin tear with the center folded under.   Procedure The patient's permission area was soaked with a warm water gauze with some Hibiclens.  With forceps I was able to flatten out the wound and reapproximate the edges.  Applied pressure.  Dried skin.  Applied benzoin.  Loosely close the wound with Steri-Strips.  Dressing placed.  Wound care discussed. UC Treatments / Results  Labs (all labs ordered are listed, but only abnormal results are displayed) Labs Reviewed - No data to display  EKG None  Radiology No results found.  Procedures Procedures (including critical care time)  Medications Ordered in UC Medications - No data to display  Initial Impression / Assessment and Plan / UC Course  I have reviewed the triage vital signs and the nursing notes.  Pertinent labs & imaging results that were available during my care of the patient were reviewed by me and considered in my medical decision making (see chart for details).      Final Clinical Impressions(s) / UC Diagnoses   Final diagnoses:  Elbow laceration, left, initial encounter     Discharge Instructions     Keep area clean and dry.  Watch for infection.  Return for any increasing pain, redness, or drainage. Leave current dressing on for 2 days if you are able.  After this change once a day    ED Prescriptions    None  Controlled Substance Prescriptions [place] Controlled Substance Registry consulted? Not Applicable   Raylene Everts, MD 01/11/18 1212

## 2018-01-11 NOTE — ED Triage Notes (Signed)
Pt was working in the shed and sat down and noticed he scraped his L elbow. Bandage applied.

## 2018-01-11 NOTE — Discharge Instructions (Addendum)
Keep area clean and dry.  Watch for infection.  Return for any increasing pain, redness, or drainage. Leave current dressing on for 2 days if you are able.  After this change once a day

## 2018-01-28 ENCOUNTER — Encounter: Payer: Self-pay | Admitting: Internal Medicine

## 2018-01-28 ENCOUNTER — Ambulatory Visit: Payer: Medicare Other | Admitting: Internal Medicine

## 2018-01-28 ENCOUNTER — Other Ambulatory Visit: Payer: Medicare Other

## 2018-01-28 DIAGNOSIS — R972 Elevated prostate specific antigen [PSA]: Secondary | ICD-10-CM | POA: Diagnosis not present

## 2018-01-28 DIAGNOSIS — R197 Diarrhea, unspecified: Secondary | ICD-10-CM | POA: Diagnosis not present

## 2018-01-28 MED ORDER — LOPERAMIDE HCL 2 MG PO TABS: 2.0000 mg | ORAL_TABLET | Freq: Four times a day (QID) | ORAL | 2 refills | Status: DC | PRN

## 2018-01-28 MED ORDER — SACCHAROMYCES BOULARDII 250 MG PO CAPS: 250.0000 mg | ORAL_CAPSULE | Freq: Two times a day (BID) | ORAL | 0 refills | Status: DC

## 2018-01-28 NOTE — Assessment & Plan Note (Signed)
PSA in 6mo

## 2018-01-28 NOTE — Assessment & Plan Note (Addendum)
lactaid C diff test Florastor x 1 mo Gluten free trial Imodium AD prn

## 2018-01-28 NOTE — Patient Instructions (Signed)
  Gluten free trial for 4-6 weeks. OK to use gluten-free bread and gluten-free pasta.    Gluten-Free Diet for Celiac Disease, Adult The gluten-free diet includes all foods that do not contain gluten. Gluten is a protein that is found in wheat, rye, barley, and some other grains. Following the gluten-free diet is the only treatment for people with celiac disease. It helps to prevent damage to the intestines and improves or eliminates the symptoms of celiac disease. Following the gluten-free diet requires some planning. It can be challenging at first, but it gets easier with time and practice. There are more gluten-free options available today than ever before. If you need help finding gluten-free foods or if you have questions, talk with your diet and nutrition specialist (registered dietitian) or your health care provider. What do I need to know about a gluten-free diet?  All fruits, vegetables, and meats are safe to eat and do not contain gluten.  When grocery shopping, start by shopping in the produce, meat, and dairy sections. These sections are more likely to contain gluten-free foods. Then move to the aisles that contain packaged foods if you need to.  Read all food labels. Gluten is often added to foods. Always check the ingredient list and look for warnings, such as "may contain gluten."  Talk with your dietitian or health care provider before taking a gluten-free multivitamin or mineral supplement.  Be aware of gluten-free foods having contact with foods that contain gluten (cross-contamination). This can happen at home and with any processed foods. ? Talk with your health care provider or dietitian about how to reduce the risk of cross-contamination in your home. ? If you have questions about how a food is processed, ask the manufacturer. What key words help to identify gluten? Foods that list any of these key words on the label usually contain gluten:  Wheat, flour, enriched  flour, bromated flour, white flour, durum flour, graham flour, phosphated flour, self-rising flour, semolina, farina, barley (malt), rye, and oats.  Starch, dextrin, modified food starch, or cereal.  Thickening, fillers, or emulsifiers.  Malt flavoring, malt extract, or malt syrup.  Hydrolyzed vegetable protein.  In the [place] packaged foods that are gluten-free are required to be labeled "GF." These foods should be easy to identify and are safe to eat. In the [place] food companies are also required to list common food allergens, including wheat, on their labels. Recommended foods Grains  Amaranth, bean flours, 100% buckwheat flour, corn, millet, nut flours or nut meals, GF oats, quinoa, rice, sorghum, teff, rice wafers, pure cornmeal tortillas, popcorn, and hot cereals made from cornmeal. Hominy, rice, wild rice. Some Asian rice noodles or bean noodles. Arrowroot starch, corn bran, corn flour, corn germ, cornmeal, corn starch, potato flour, potato starch flour, and rice bran. Plain, brown, and sweet rice flours. Rice polish, soy flour, and tapioca starch. Vegetables  All plain fresh, frozen, and canned vegetables. Fruits  All plain fresh, frozen, canned, and dried fruits, and 100% fruit juices. Meats and other protein foods  All fresh beef, pork, poultry, fish, seafood, and eggs. Fish canned in water, oil, brine, or vegetable broth. Plain nuts and seeds, peanut butter. Some lunch meat and some frankfurters. Dried beans, dried peas, and lentils. Dairy  Fresh plain, dry, evaporated, or condensed milk. Cream, butter, sour cream, whipping cream, and most yogurts. Unprocessed cheese, most processed cheeses, some cottage cheese, some cream cheeses. Beverages  Coffee, tea, most herbal teas. Carbonated beverages and some root beers.   Wine, sake, and distilled spirits, such as gin, vodka, and whiskey. Most hard ciders. Fats and oils  Butter, margarine, vegetable oil, hydrogenated butter, olive  oil, shortening, lard, cream, and some mayonnaise. Some commercial salad dressings. Olives. Sweets and desserts  Sugar, honey, some syrups, molasses, jelly, and jam. Plain hard candy, marshmallows, and gumdrops. Pure cocoa powder. Plain chocolate. Custard and some pudding mixes. Gelatin desserts, sorbets, frozen ice pops, and sherbet. Cake, cookies, and other desserts prepared with allowed flours. Some commercial ice creams. Cornstarch, tapioca, and rice puddings. Seasoning and other foods  Some canned or frozen soups. Monosodium glutamate (MSG). Cider, rice, and wine vinegar. Baking soda and baking powder. Cream of tartar. Baking and nutritional yeast. Certain soy sauces made without wheat (ask your dietitian about specific brands that are allowed). Nuts, coconut, and chocolate. Salt, pepper, herbs, spices, flavoring extracts, imitation or artificial flavorings, natural flavorings, and food colorings. Some medicines and supplements. Some lip glosses and other cosmetics. Rice syrups. The items listed may not be a complete list. Talk with your dietitian about what dietary choices are best for you. Foods to avoid Grains  Barley, bran, bulgur, couscous, cracked wheat, [place], farro, graham, malt, matzo, semolina, wheat germ, and all wheat and rye cereals including spelt and kamut. Cereals containing malt as a flavoring, such as rice cereal. Noodles, spaghetti, macaroni, most packaged rice mixes, and all mixes containing wheat, rye, barley, or triticale. Vegetables  Most creamed vegetables and most vegetables canned in sauces. Some commercially prepared vegetables and salads. Fruits  Thickened or prepared fruits and some pie fillings. Some fruit snacks and fruit roll-ups. Meats and other protein foods  Any meat or meat alternative containing wheat, rye, barley, or gluten stabilizers. These are often marinated or packaged meats and lunch meats. Bread-containing products, such as Swiss steak,  croquettes, meatballs, and meatloaf. Most tuna canned in vegetable broth and turkey with hydrolyzed vegetable protein (HVP) injected as part of the basting. Seitan. Imitation fish. Eggs in sauces made from ingredients to avoid. Dairy  Commercial chocolate milk drinks and malted milk. Some non-dairy creamers. Any cheese product containing ingredients to avoid. Beverages  Certain cereal beverages. Beer, ale, malted milk, and some root beers. Some hard ciders. Some instant flavored coffees. Some herbal teas made with barley or with barley malt added. Fats and oils  Some commercial salad dressings. Sour cream containing modified food starch. Sweets and desserts  Some toffees. Chocolate-coated nuts (may be rolled in wheat flour) and some commercial candies and candy bars. Most cakes, cookies, donuts, pastries, and other baked goods. Some commercial ice cream. Ice cream cones. Commercially prepared mixes for cakes, cookies, and other desserts. Bread pudding and other puddings thickened with flour. Products containing brown rice syrup made with barley malt enzyme. Desserts and sweets made with malt flavoring. Seasoning and other foods  Some curry powders, some dry seasoning mixes, some gravy extracts, some meat sauces, some ketchups, some prepared mustards, and horseradish. Certain soy sauces. Malt vinegar. Bouillon and bouillon cubes that contain HVP. Some chip dips, and some chewing gum. Yeast extract. Brewer's yeast. Caramel color. Some medicines and supplements. Some lip glosses and other cosmetics. The items listed may not be a complete list. Talk with your dietitian about what dietary choices are best for you. Summary  Gluten is a protein that is found in wheat, rye, barley, and some other grains. The gluten-free diet includes all foods that do not contain gluten.  If you need help finding gluten-free foods or if   you have questions, talk with your diet and nutrition specialist (registered  dietitian) or your health care provider.  Read all food labels. Gluten is often added to foods. Always check the ingredient list and look for warnings, such as "may contain gluten." This information is not intended to replace advice given to you by your health care provider. Make sure you discuss any questions you have with your health care provider. Document Released: 07/03/2005 Document Revised: 04/17/2016 Document Reviewed: 04/17/2016 Elsevier Interactive Patient Education  2018 Elsevier Inc.   

## 2018-01-28 NOTE — Addendum Note (Signed)
Addended by: Raliegh Ip on: 01/28/2018 10:47 AM   Modules accepted: Orders

## 2018-01-28 NOTE — Progress Notes (Signed)
Subjective:  Patient ID: [name], male    DOB: May 01, 1929  Age: 84 y.o. MRN: [id number]  CC: No chief complaint on file.   HPI [name] presents for CAD, HTN,  C/o diarrhea - lactose intolerant. C/o diarrhea (3/day) - 5-6 months. On pepto bismol x days. Colon 6 y ago ok by Dr Wynetta Emery  Outpatient Medications Prior to Visit  Medication Sig Dispense Refill  . amLODipine (NORVASC) 10 MG tablet Take 1 tablet (10 mg total) by mouth daily. Please call and schedule an appointment for further refills 1st attempt 90 tablet 3  . Ascorbic Acid (VITAMIN C PO) Take 1 capsule by mouth daily.    Marland Kitchen aspirin EC 81 MG tablet Take 1 tablet (81 mg total) by mouth daily. 90 tablet 3  . B Complex-C (SUPER B COMPLEX PO) Take 1 tablet by mouth daily.    . cholecalciferol (VITAMIN D) 1000 UNITS tablet Take 1,000 Units by mouth daily.    . fish oil-omega-3 fatty acids 1000 MG capsule Take 1 g by mouth 2 (two) times daily.     Marland Kitchen lisinopril (PRINIVIL,ZESTRIL) 20 MG tablet TAKE 1 TABLET (20 MG TOTAL) BY MOUTH TWICE DAILY. 180 tablet 3  . Magnesium 400 MG TABS Take 400 mg by mouth daily.    . metoprolol succinate (TOPROL-XL) 25 MG 24 hr tablet TAKE 1 TABLET (25 MG TOTAL) BY MOUTH DAILY. 90 tablet 3  . multivitamin (THERAGRAN) tablet Take 1 tablet by mouth daily.      . nitroGLYCERIN (NITROSTAT) 0.4 MG SL tablet Place 1 tablet (0.4 mg total) under the tongue every 5 (five) minutes as needed for chest pain (Max 3 doses within 15 minutes call 911). 20 tablet 2  . simvastatin (ZOCOR) 20 MG tablet Take 1 tablet (20 mg total) by mouth at bedtime. 90 tablet 3  . tamsulosin (FLOMAX) 0.4 MG CAPS capsule Take 2 capsules (0.8 mg total) by mouth daily. 90 capsule 3   No facility-administered medications prior to visit.     ROS: Review of Systems  Constitutional: Negative for appetite change, fatigue and unexpected weight change.  HENT: Negative for congestion, nosebleeds, sneezing, sore throat and trouble  swallowing.   Eyes: Negative for itching and visual disturbance.  Respiratory: Negative for cough.   Cardiovascular: Negative for chest pain, palpitations and leg swelling.  Gastrointestinal: Positive for diarrhea. Negative for abdominal distention, blood in stool and nausea.  Genitourinary: Negative for frequency and hematuria.  Musculoskeletal: Negative for back pain, gait problem, joint swelling and neck pain.  Skin: Negative for rash.  Neurological: Negative for dizziness, tremors, speech difficulty and weakness.  Psychiatric/Behavioral: Negative for agitation, dysphoric mood and sleep disturbance. The patient is not nervous/anxious.     Objective:  BP 130/72 (BP Location: Left Arm, Patient Position: Sitting, Cuff Size: Normal)   Pulse (!) 58   Temp 98.4 F (36.9 C) (Oral)   Ht 5\' 6"  (1.676 m)   Wt 130 lb (59 kg)   HC 98" (248.9 cm)   BMI 20.98 kg/m   BP Readings from Last 3 Encounters:  01/28/18 130/72  01/11/18 (!) 103/55  09/26/17 (!) 130/58    Wt Readings from Last 3 Encounters:  01/28/18 130 lb (59 kg)  09/26/17 132 lb (59.9 kg)  07/27/17 134 lb (60.8 kg)    Physical Exam  Constitutional: He is oriented to person, place, and time. He appears well-developed. No distress.  NAD  HENT:  Mouth/Throat: Oropharynx is clear and moist.  Eyes: Pupils are equal, round, and reactive to light. Conjunctivae are normal.  Neck: Normal range of motion. No JVD present. No thyromegaly present.  Cardiovascular: Normal rate, regular rhythm, normal heart sounds and intact distal pulses. Exam reveals no gallop and no friction rub.  No murmur heard. Pulmonary/Chest: Effort normal and breath sounds normal. No respiratory distress. He has no wheezes. He has no rales. He exhibits no tenderness.  Abdominal: Soft. Bowel sounds are normal. He exhibits no distension and no mass. There is no tenderness. There is no rebound and no guarding.  Musculoskeletal: Normal range of motion. He exhibits  no edema or tenderness.  Lymphadenopathy:    He has no cervical adenopathy.  Neurological: He is alert and oriented to person, place, and time. He has normal reflexes. No cranial nerve deficit. He exhibits normal muscle tone. He displays a negative Romberg sign. Coordination and gait normal.  Skin: Skin is warm and dry. No rash noted.  Psychiatric: He has a normal mood and affect. His behavior is normal. Judgment and thought content normal.    Lab Results  Component Value Date   WBC 7.7 07/27/2017   HGB 13.2 07/27/2017   HCT 39.7 07/27/2017   PLT 218.0 07/27/2017   GLUCOSE 110 (H) 10/30/2017   CHOL 154 07/27/2017   TRIG 185.0 (H) 07/27/2017   HDL 34.30 (L) 07/27/2017   LDLCALC 83 07/27/2017   ALT 8 07/27/2017   AST 14 07/27/2017   NA 142 10/30/2017   K 4.9 10/30/2017   CL 107 (H) 10/30/2017   CREATININE 1.62 (H) 10/30/2017   BUN 30 (H) 10/30/2017   CO2 25 10/30/2017   TSH 3.13 07/27/2017   PSA 3.62 07/27/2017   INR 1.49 04/18/2010   HGBA1C 5.7 03/11/2014    No results found.  Assessment & Plan:   There are no diagnoses linked to this encounter.   No orders of the defined types were placed in this encounter.    Follow-up: No follow-ups on file.  Walker Kehr, MD

## 2018-01-29 ENCOUNTER — Other Ambulatory Visit: Payer: Medicare Other

## 2018-01-29 DIAGNOSIS — R197 Diarrhea, unspecified: Secondary | ICD-10-CM

## 2018-01-31 LAB — CLOSTRIDIUM DIFFICILE EIA: C difficile Toxins A+B, EIA: NEGATIVE

## 2018-01-31 LAB — SPECIMEN STATUS REPORT

## 2018-03-26 DIAGNOSIS — H25813 Combined forms of age-related cataract, bilateral: Secondary | ICD-10-CM | POA: Diagnosis not present

## 2018-04-26 ENCOUNTER — Encounter: Payer: Self-pay | Admitting: Physician Assistant

## 2018-05-08 ENCOUNTER — Ambulatory Visit (INDEPENDENT_AMBULATORY_CARE_PROVIDER_SITE_OTHER): Payer: Medicare Other

## 2018-05-08 DIAGNOSIS — Z23 Encounter for immunization: Secondary | ICD-10-CM

## 2018-05-13 ENCOUNTER — Other Ambulatory Visit: Payer: Self-pay | Admitting: Internal Medicine

## 2018-05-13 DIAGNOSIS — I359 Nonrheumatic aortic valve disorder, unspecified: Secondary | ICD-10-CM

## 2018-05-13 DIAGNOSIS — I10 Essential (primary) hypertension: Secondary | ICD-10-CM

## 2018-05-13 DIAGNOSIS — I25709 Atherosclerosis of coronary artery bypass graft(s), unspecified, with unspecified angina pectoris: Secondary | ICD-10-CM

## 2018-05-13 NOTE — Progress Notes (Signed)
Cardiology Office Note:    Date:  05/14/2018   ID:  [name], DOB 06/11/1929, MRN [id number]  PCP:  Cassandria Anger, MD  Cardiologist:  Sherren Mocha, MD  Electrophysiologist:  None   Referring MD: Cassandria Anger, MD   Chief Complaint  Patient presents with  . Follow-up    CAD, Hx of tissue AVR    History of Present Illness:    [name] is a 84 y.o. male with coronary artery disease status post CABG in 1995, status post PCI/stenting to the SVG-OM in 2008, aortic stenosis status post bioprosthetic aortic valve replacement in 2011, peripheral vascular disease s/p remote L lower extremity stenting, renal artery stenosis.  He has known moderate aortic stenosis of the prosthetic valve.  Echocardiogram in March 2019 demonstrated mild stenosis across the bioprosthetic aortic valve with a mean gradient of 32 mmHg.  This was reviewed with Dr. Burt Knack who felt that the gradients were overall stable.    Mr. Quast returns for follow-up.  He is here with his wife.  Since last seen, he has not had any significant chest discomfort or significant shortness of breath.  He does note left leg pain with walking.  This seems to have gotten worse over the years.  He has not had any rest pain, pain at night or nonhealing ulcers on his foot.  He has not had any orthopnea, paroxysmal nocturnal dyspnea, lower extremity swelling or syncope.  He does note leg cramps at night.  Prior CV studies:   The following studies were reviewed today:  ABIs/arterial Dopplers 10/12/2017 Right ABIs appear essentially unchanged compared to prior study on 08/08/2016. Left ABIs appear essentially unchanged compared to prior study on 08/08/2016.  Final Interpretation: Right: Resting right ankle-brachial index indicates mild right lower extremity arterial disease. The right toe-brachial index is abnormal.  Left: Resting left ankle-brachial index is within normal range. No evidence of significant left lower  extremity arterial disease. The left toe-brachial index is normal.  Final Interpretation: Left: Patent mid-distal SFA stent without focal stenosis.   Patent popliteal artery without focal stenosis, s/p PTA. Suggest follow up study in 12 months.  Carotid US 10/12/2017 Bilateral ICA 40-59 Repeat 1 year  Echocardiogram 10/05/2017 Mild LVH, EF 65-70, dynamic obstruction during Valsalva in the mid cavity with mid cavity obliteration (peak velocity of 200 cm/s; peak gradient 16 mmHg), normal wall motion, grade 1 diastolic dysfunction, bioprosthetic aortic valve with transvalvular velocity increased more than expected (mild aortic stenosis, mean 32, peak 56), mild to moderate MR, mild LAE  Echo 08/29/16 Mild LVH, mild focal basal septal hypertrophy, EF 55-60, normal wall motion, grade 2 diastolic dysfunction, bioprosthetic AVR with moderate aortic stenosis (mean 33, peak 57), MAC, moderate MR, severe LAE  ABI/LE arterial Doppler 1/18 Right ABI has decreased compared to prior exam, now in mild range of flow reduction. Left ABI is normal and stable, s/p left SFA stent and left popliteal artery PTA. Amplitudes of CW waveforms are higher, bilaterally. Right great toe-brachial index is abnormal. Left great toe-brachial index is normal. Heterogeneous plaque in the left lower extremity. Progression of disease, without focal stenosis, in the proximal left SFA, now in the low 50-74% range, s/p stent. Three vessel run-off noted on the 12/15 bilateral lower extremity duplex report. Follow-up 1 year  Carotid US 08/08/16 R 40-59; L1-39 Follow-up 1 year  Echo 07/27/14 Mild LVH, EF 60-65, normal wall motion, grade 1 diastolic dysfunction, bioprosthetic AVR with moderate aortic stenosis (mean 34),  mild MR, mild LAE, normal RV SF  Renal artery [place] 10/15 Normal caliber abdominal aorta, >50 distal aorta stenosis, >70 proximal SMA stenosis Bilateral RAS 1-59  Cardiac catheterization 8/11 LM  normal LAD proximal-mid 70-80, mid 20, D2 occluded LCx mid mild plaque, OM2 occluded RCA mid to distal occluded LIMA-LAD atretic SVG-DX patent SVG-OM patent, proximal stent patent with 30 ISR SVG-PDA patent EF 60-65  Nuclear stress test 4/11 Inferolateral scar, no ischemia, EF 63  Past Medical History:  Diagnosis Date  . Aortic valve disorders    Echo 3/19: Mild LVH, EF 65-70, dynamic obstruction during Valsalva in mid cavity with mid cavity obliteration (peak velocity 200 cm/s, peak gradient 16 mmHg), normal wall motion, grade 1 diastolic dysfunction, AVR with mild stenosis (mean gradient 32, peak 56), MAC, mild to moderate MR, mild LAE  . Atherosclerosis of renal artery ([place])   . Bilateral renal artery stenosis ([facility])    documented at the time of angiography by Dr. Albertine Patricia  . BPH (benign prostatic hypertrophy)   . Carotid artery disease ([place])    Carotid US 3/19: Bilateral ICA 40-59, follow-up 1 year  . Cerebrovascular disease, unspecified   . Coronary atherosclerosis of unspecified type of vessel, native or graft   . History of cystoscopy    w/ bladder biopsy  . History of echocardiogram    Echo 3/19: mild LVH, EF 65-70, dynamic obstruction during Valsalva with mid cavity obliteration (peak velocity of 200 cm/sec - peak gradient 16 mmHg), no RWMA, Gr 1 DD, s/p AVR, mild AS (mean 32, peak 56), MAC, mild to mod MR, mild LAE  . Malignant neoplasm of bladder, part unspecified   . Melanoma in situ of back ([place]) 2012   area of left scapula  . Old myocardial infarction   . Other and unspecified hyperlipidemia   . Peripheral vascular disease, unspecified ([place])    history of stent left lower extremity placed by Dr. Albertine Patricia  . Postsurgical aortocoronary bypass status   . Postsurgical percutaneous transluminal coronary angioplasty status   . Scarlet fever   . Unspecified sleep apnea    Surgical Hx: The patient  has a past surgical history that includes Cystoscopy; Percutaneous  transluminal coronary angioplasty; Coronary artery bypass graft; redo median sternotomy; and Melanoma excision (2012).   Current Medications: Current Meds  Medication Sig  . amLODipine (NORVASC) 10 MG tablet Take 1 tablet (10 mg total) by mouth daily. Please call and schedule an appointment for further refills 1st attempt  . Ascorbic Acid (VITAMIN C PO) Take 1 capsule by mouth daily.  Marland Kitchen aspirin EC 81 MG tablet Take 1 tablet (81 mg total) by mouth daily.  . B Complex-C (SUPER B COMPLEX PO) Take 1 tablet by mouth daily.  . cholecalciferol (VITAMIN D) 1000 UNITS tablet Take 1,000 Units by mouth daily.  . fish oil-omega-3 fatty acids 1000 MG capsule Take 1 g by mouth 2 (two) times daily.   Marland Kitchen lisinopril (PRINIVIL,ZESTRIL) 20 MG tablet TAKE 1 TABLET (20 MG TOTAL) BY MOUTH TWICE DAILY.  Marland Kitchen loperamide (IMODIUM A-D) 2 MG tablet Take 1 tablet (2 mg total) by mouth 4 (four) times daily as needed for diarrhea or loose stools.  . Magnesium 400 MG TABS Take 400 mg by mouth daily.  . metoprolol succinate (TOPROL-XL) 25 MG 24 hr tablet TAKE 1 TABLET BY MOUTH DAILY  . multivitamin (THERAGRAN) tablet Take 1 tablet by mouth daily.    . nitroGLYCERIN (NITROSTAT) 0.4 MG SL tablet Place 1 tablet (0.4  mg total) under the tongue every 5 (five) minutes as needed for chest pain (Max 3 doses within 15 minutes call 911).  . saccharomyces boulardii (FLORASTOR) 250 MG capsule Take 1 capsule (250 mg total) by mouth 2 (two) times daily.  . simvastatin (ZOCOR) 20 MG tablet Take 1 tablet (20 mg total) by mouth at bedtime.  . tamsulosin (FLOMAX) 0.4 MG CAPS capsule Take 2 capsules (0.8 mg total) by mouth daily.     Allergies:   Sulfonamide derivatives   Social History   Tobacco Use  . Smoking status: Never Smoker  . Smokeless tobacco: Never Used  Substance Use Topics  . Alcohol use: Yes    Comment: rare  . Drug use: No     Family Hx: The patient's family history includes Cancer in his other; Coronary artery disease  in his father and mother; Diabetes in his mother; Heart attack in his father; Heart disease in his father and mother; Hyperlipidemia in his mother; Hypertension in his mother; Stroke in his mother; Sudden death in his father. There is no history of Birth defects.  ROS:   Please see the history of present illness.    Review of Systems  Cardiovascular: Positive for claudication.  Musculoskeletal: Positive for muscle cramps.   All other systems reviewed and are negative.   EKGs/Labs/Other Test Reviewed:    EKG:  EKG is  ordered today.  The ekg ordered today demonstrates sinus bradycardia, heart rate 56, normal axis, right bundle branch block, first-degree AV block, QTC 447, similar to old EKGs  Recent Labs: 07/27/2017: ALT 8; Hemoglobin 13.2; Platelets 218.0; TSH 3.13 10/30/2017: BUN 30; Creatinine, Ser 1.62; Potassium 4.9; Sodium 142   Recent Lipid Panel Lab Results  Component Value Date/Time   CHOL 154 07/27/2017 02:51 PM   TRIG 185.0 (H) 07/27/2017 02:51 PM   HDL 34.30 (L) 07/27/2017 02:51 PM   CHOLHDL 4 07/27/2017 02:51 PM   LDLCALC 83 07/27/2017 02:51 PM    Physical Exam:    VS:  BP (!) 130/50   Pulse (!) 56   Ht 5' 6"  (1.676 m)   Wt 134 lb 6.4 oz (61 kg)   SpO2 99%   BMI 21.69 kg/m     Wt Readings from Last 3 Encounters:  05/14/18 134 lb 6.4 oz (61 kg)  01/28/18 130 lb (59 kg)  09/26/17 132 lb (59.9 kg)     Physical Exam  Constitutional: He is oriented to person, place, and time. He appears well-developed and well-nourished. No distress.  HENT:  Head: Normocephalic and atraumatic.  Eyes: No scleral icterus.  Neck: No JVD present. No thyromegaly present.  Cardiovascular: Normal rate and regular rhythm.  Murmur heard.  Crescendo-decrescendo systolic murmur is present with a grade of 2/6 at the upper right sternal border. Pulmonary/Chest: Effort normal. He has no rales.  Abdominal: Soft.  Musculoskeletal: He exhibits no edema.  Lymphadenopathy:    He has no  cervical adenopathy.  Neurological: He is alert and oriented to person, place, and time.  Skin: Skin is warm and dry.  Psychiatric: He has a normal mood and affect.    ASSESSMENT & PLAN:    Coronary artery disease involving native coronary artery of native heart without angina pectoris History of remote CABG in 1995 and subsequent stenting of the vein graft to the obtuse marginal in 2008.  Cardiac catheterization in 2011 demonstrated an atretic LIMA-LAD and patent vein graft to the diagonal, patent vein graft to the obtuse marginal and patent  vein graft to the PDA.    He is doing well without angina.  Continue aspirin, beta-blocker, statin.  Aortic valve stenosis, etiology of cardiac valve disease unspecified History of bioprosthetic aortic valve replacement.  Echocardiogram in March 2019 with mean gradient 32 mmHg.  Plan repeat echo in March 2020.  Continue SBE prophylaxis.  Bilateral carotid artery disease, unspecified type ([place]) Stable disease by recent carotid Dopplers.  Plan repeat carotid US in March 2020.  Continue aspirin, statin.  Peripheral vascular disease ([place]) He has a history of remote stenting to the left SFA.  Recent lower extremity arterial Dopplers suggest a patent left SFA stent.  He does describe left lower extremity claudication.  He has not had any rest symptoms.  I have asked him to try to pursue a walking program to improve his symptoms.  If his symptoms worsen or do not improve, I will have him follow-up with Dr. Gwenlyn Found.  Essential hypertension Overall acceptable.  Continue current therapy.  Hyperlipidemia, unspecified hyperlipidemia type Continue statin therapy.   Dispo:  Return in about 5 months (around 10/13/2018) for Routine Follow Up, w/ Dr. Burt Knack.   Medication Adjustments/Labs and Tests Ordered: Current medicines are reviewed at length with the patient today.  Concerns regarding medicines are outlined above.  Tests Ordered: Orders Placed This Encounter   Procedures  . EKG 12-Lead  . ECHOCARDIOGRAM COMPLETE   Medication Changes: No orders of the defined types were placed in this encounter.   Signed, Richardson Dopp, PA-C  05/14/2018 12:46 PM    [facility] [address], [place], [place]  [address] Phone: [phone]; Fax: [phone]

## 2018-05-14 ENCOUNTER — Encounter: Payer: Self-pay | Admitting: Physician Assistant

## 2018-05-14 ENCOUNTER — Ambulatory Visit (INDEPENDENT_AMBULATORY_CARE_PROVIDER_SITE_OTHER): Payer: Medicare Other | Admitting: Physician Assistant

## 2018-05-14 VITALS — BP 130/50 | HR 56 | Ht 66.0 in | Wt 134.4 lb

## 2018-05-14 DIAGNOSIS — I2510 Atherosclerotic heart disease of native coronary artery without angina pectoris: Secondary | ICD-10-CM | POA: Diagnosis not present

## 2018-05-14 DIAGNOSIS — I10 Essential (primary) hypertension: Secondary | ICD-10-CM

## 2018-05-14 DIAGNOSIS — I739 Peripheral vascular disease, unspecified: Secondary | ICD-10-CM

## 2018-05-14 DIAGNOSIS — I779 Disorder of arteries and arterioles, unspecified: Secondary | ICD-10-CM

## 2018-05-14 DIAGNOSIS — E785 Hyperlipidemia, unspecified: Secondary | ICD-10-CM

## 2018-05-14 DIAGNOSIS — I350 Nonrheumatic aortic (valve) stenosis: Secondary | ICD-10-CM | POA: Diagnosis not present

## 2018-05-14 NOTE — Patient Instructions (Signed)
Medication Instructions:  1. Your physician recommends that you continue on your current medications as directed. Please refer to the Current Medication list given to you today.  If you need a refill on your cardiac medications before your next appointment, please call your pharmacy.   Lab work: NONE ORDERED TODAY If you have labs (blood work) drawn today and your tests are completely normal, you will receive your results only by: Marland Kitchen MyChart Message (if you have MyChart) OR . A paper copy in the mail If you have any lab test that is abnormal or we need to change your treatment, we will call you to review the results.  Testing/Procedures: 1. Your physician has requested that you have an echocardiogram. Echocardiography is a painless test that uses sound waves to create images of your heart. It provides your doctor with information about the size and shape of your heart and how well your heart's chambers and valves are working. This procedure takes approximately one hour. There are no restrictions for this procedure. TO BE DONE IN MARCH 2020  2. Your physician has requested that you have a carotid duplex. This test is an ultrasound of the carotid arteries in your neck. It looks at blood flow through these arteries that supply the brain with blood. Allow one hour for this exam. There are no restrictions or special instructions. TO BE DONE IN MARCH 2020    Follow-Up: At [facility], you and your health needs are our priority.  As part of our continuing mission to provide you with exceptional heart care, we have created designated Provider Care Teams.  These Care Teams include your primary Cardiologist (physician) and Advanced Practice Providers (APPs -  Physician Assistants and Nurse Practitioners) who all work together to provide you with the care you need, when you need it. You will need a follow up appointment in:  5 months.  Please call our office 2 months in advance to schedule this  appointment.  You may see Sherren Mocha, MD AFTER TESTING HAVE BEEN COMPLETED.   Any Other Special Instructions Will Be Listed Below (If Applicable). 1. TRY TO INCREASE WALKING TO HELP WITH LEG PAIN  2. CALL THE OFFICE IF LEG PAIN IS NOT GETTING BETTER; [phone]

## 2018-07-23 DIAGNOSIS — R3912 Poor urinary stream: Secondary | ICD-10-CM | POA: Diagnosis not present

## 2018-07-23 DIAGNOSIS — N401 Enlarged prostate with lower urinary tract symptoms: Secondary | ICD-10-CM | POA: Diagnosis not present

## 2018-07-23 DIAGNOSIS — Z8551 Personal history of malignant neoplasm of bladder: Secondary | ICD-10-CM | POA: Diagnosis not present

## 2018-07-24 DIAGNOSIS — Z85828 Personal history of other malignant neoplasm of skin: Secondary | ICD-10-CM | POA: Diagnosis not present

## 2018-07-24 DIAGNOSIS — D1801 Hemangioma of skin and subcutaneous tissue: Secondary | ICD-10-CM | POA: Diagnosis not present

## 2018-07-24 DIAGNOSIS — Z85820 Personal history of malignant melanoma of skin: Secondary | ICD-10-CM | POA: Diagnosis not present

## 2018-07-24 DIAGNOSIS — D692 Other nonthrombocytopenic purpura: Secondary | ICD-10-CM | POA: Diagnosis not present

## 2018-07-24 DIAGNOSIS — D0471 Carcinoma in situ of skin of right lower limb, including hip: Secondary | ICD-10-CM | POA: Diagnosis not present

## 2018-07-30 ENCOUNTER — Encounter: Payer: Self-pay | Admitting: Internal Medicine

## 2018-07-30 ENCOUNTER — Other Ambulatory Visit (INDEPENDENT_AMBULATORY_CARE_PROVIDER_SITE_OTHER): Payer: Medicare Other

## 2018-07-30 ENCOUNTER — Ambulatory Visit: Payer: Medicare Other | Admitting: Internal Medicine

## 2018-07-30 DIAGNOSIS — I739 Peripheral vascular disease, unspecified: Secondary | ICD-10-CM

## 2018-07-30 DIAGNOSIS — E039 Hypothyroidism, unspecified: Secondary | ICD-10-CM

## 2018-07-30 DIAGNOSIS — I679 Cerebrovascular disease, unspecified: Secondary | ICD-10-CM

## 2018-07-30 DIAGNOSIS — I2510 Atherosclerotic heart disease of native coronary artery without angina pectoris: Secondary | ICD-10-CM

## 2018-07-30 DIAGNOSIS — I779 Disorder of arteries and arterioles, unspecified: Secondary | ICD-10-CM

## 2018-07-30 DIAGNOSIS — C679 Malignant neoplasm of bladder, unspecified: Secondary | ICD-10-CM

## 2018-07-30 DIAGNOSIS — R972 Elevated prostate specific antigen [PSA]: Secondary | ICD-10-CM

## 2018-07-30 DIAGNOSIS — G4700 Insomnia, unspecified: Secondary | ICD-10-CM | POA: Insufficient documentation

## 2018-07-30 LAB — HEPATIC FUNCTION PANEL
ALBUMIN: 3.8 g/dL (ref 3.5–5.2)
ALT: 13 U/L (ref 0–53)
AST: 18 U/L (ref 0–37)
Alkaline Phosphatase: 45 U/L (ref 39–117)
Bilirubin, Direct: 0.1 mg/dL (ref 0.0–0.3)
TOTAL PROTEIN: 5.9 g/dL — AB (ref 6.0–8.3)
Total Bilirubin: 0.3 mg/dL (ref 0.2–1.2)

## 2018-07-30 LAB — URINALYSIS, ROUTINE W REFLEX MICROSCOPIC
BILIRUBIN URINE: NEGATIVE
Hgb urine dipstick: NEGATIVE
KETONES UR: NEGATIVE
Leukocytes, UA: NEGATIVE
Nitrite: NEGATIVE
RBC / HPF: NONE SEEN (ref 0–?)
Specific Gravity, Urine: 1.015 (ref 1.000–1.030)
Total Protein, Urine: 30 — AB
URINE GLUCOSE: NEGATIVE
Urobilinogen, UA: 0.2 (ref 0.0–1.0)
pH: 6 (ref 5.0–8.0)

## 2018-07-30 LAB — CBC WITH DIFFERENTIAL/PLATELET
Basophils Absolute: 0 10*3/uL (ref 0.0–0.1)
Basophils Relative: 0.5 % (ref 0.0–3.0)
EOS ABS: 0.1 10*3/uL (ref 0.0–0.7)
Eosinophils Relative: 1.3 % (ref 0.0–5.0)
HCT: 38.1 % — ABNORMAL LOW (ref 39.0–52.0)
Hemoglobin: 12.8 g/dL — ABNORMAL LOW (ref 13.0–17.0)
Lymphocytes Relative: 23.4 % (ref 12.0–46.0)
Lymphs Abs: 1.5 10*3/uL (ref 0.7–4.0)
MCHC: 33.7 g/dL (ref 30.0–36.0)
MCV: 91.5 fl (ref 78.0–100.0)
Monocytes Absolute: 0.6 10*3/uL (ref 0.1–1.0)
Monocytes Relative: 10.1 % (ref 3.0–12.0)
Neutro Abs: 4.1 10*3/uL (ref 1.4–7.7)
Neutrophils Relative %: 64.7 % (ref 43.0–77.0)
Platelets: 145 10*3/uL — ABNORMAL LOW (ref 150.0–400.0)
RBC: 4.16 Mil/uL — ABNORMAL LOW (ref 4.22–5.81)
RDW: 14.3 % (ref 11.5–15.5)
WBC: 6.4 10*3/uL (ref 4.0–10.5)

## 2018-07-30 LAB — LIPID PANEL
CHOL/HDL RATIO: 3
Cholesterol: 164 mg/dL (ref 0–200)
HDL: 51 mg/dL (ref >39.00)
LDL Cholesterol: 83 mg/dL (ref 0–99)
NonHDL: 113.17
TRIGLYCERIDES: 149 mg/dL (ref 0.0–149.0)
VLDL: 29.8 mg/dL (ref 0.0–40.0)

## 2018-07-30 LAB — BASIC METABOLIC PANEL
BUN: 28 mg/dL — ABNORMAL HIGH (ref 6–23)
CALCIUM: 9.2 mg/dL (ref 8.4–10.5)
CO2: 24 meq/L (ref 19–32)
CREATININE: 1.44 mg/dL (ref 0.40–1.50)
Chloride: 109 mEq/L (ref 96–112)
GFR: 49.28 mL/min — AB (ref >60.00)
Glucose, Bld: 101 mg/dL — ABNORMAL HIGH (ref 70–99)
Potassium: 4.6 mEq/L (ref 3.5–5.1)
Sodium: 141 mEq/L (ref 135–145)

## 2018-07-30 LAB — TSH: TSH: 4.75 u[IU]/mL — ABNORMAL HIGH (ref 0.35–4.50)

## 2018-07-30 NOTE — Assessment & Plan Note (Signed)
Declined PSA

## 2018-07-30 NOTE — Assessment & Plan Note (Signed)
Pt declined PSA F/u w/Dr Junious Silk

## 2018-07-30 NOTE — Patient Instructions (Signed)
Valerian root for sleep 

## 2018-07-30 NOTE — Assessment & Plan Note (Signed)
Carotid Doppler q 12 mo

## 2018-07-30 NOTE — Assessment & Plan Note (Signed)
Valerian root for sleep 

## 2018-07-30 NOTE — Progress Notes (Signed)
Subjective:  Patient ID: [name], male    DOB: Aug 09, 1928  Age: 85 y.o. MRN: [id number]  CC: No chief complaint on file.   HPI [name] presents for HTN, CAD, insomnia, bladder ca f/u  Outpatient Medications Prior to Visit  Medication Sig Dispense Refill  . amLODipine (NORVASC) 10 MG tablet Take 1 tablet (10 mg total) by mouth daily. Please call and schedule an appointment for further refills 1st attempt 90 tablet 3  . Ascorbic Acid (VITAMIN C PO) Take 1 capsule by mouth daily.    Marland Kitchen aspirin EC 81 MG tablet Take 1 tablet (81 mg total) by mouth daily. 90 tablet 3  . B Complex-C (SUPER B COMPLEX PO) Take 1 tablet by mouth daily.    . cholecalciferol (VITAMIN D) 1000 UNITS tablet Take 1,000 Units by mouth daily.    . fish oil-omega-3 fatty acids 1000 MG capsule Take 1 g by mouth 2 (two) times daily.     Marland Kitchen lisinopril (PRINIVIL,ZESTRIL) 20 MG tablet TAKE 1 TABLET (20 MG TOTAL) BY MOUTH TWICE DAILY. 180 tablet 3  . loperamide (IMODIUM A-D) 2 MG tablet Take 1 tablet (2 mg total) by mouth 4 (four) times daily as needed for diarrhea or loose stools. 30 tablet 2  . Magnesium 400 MG TABS Take 400 mg by mouth daily.    . metoprolol succinate (TOPROL-XL) 25 MG 24 hr tablet TAKE 1 TABLET BY MOUTH DAILY 90 tablet 3  . multivitamin (THERAGRAN) tablet Take 1 tablet by mouth daily.      . nitroGLYCERIN (NITROSTAT) 0.4 MG SL tablet Place 1 tablet (0.4 mg total) under the tongue every 5 (five) minutes as needed for chest pain (Max 3 doses within 15 minutes call 911). 20 tablet 2  . saccharomyces boulardii (FLORASTOR) 250 MG capsule Take 1 capsule (250 mg total) by mouth 2 (two) times daily. 60 capsule 0  . simvastatin (ZOCOR) 20 MG tablet Take 1 tablet (20 mg total) by mouth at bedtime. 90 tablet 3  . tamsulosin (FLOMAX) 0.4 MG CAPS capsule Take 2 capsules (0.8 mg total) by mouth daily. 90 capsule 3   No facility-administered medications prior to visit.     ROS: Review of Systems    Constitutional: Negative for appetite change, fatigue and unexpected weight change.  HENT: Negative for congestion, nosebleeds, sneezing, sore throat and trouble swallowing.   Eyes: Negative for itching and visual disturbance.  Respiratory: Negative for cough.   Cardiovascular: Negative for chest pain, palpitations and leg swelling.  Gastrointestinal: Negative for abdominal distention, blood in stool, diarrhea and nausea.  Genitourinary: Negative for frequency and hematuria.  Musculoskeletal: Positive for arthralgias. Negative for back pain, gait problem, joint swelling and neck pain.  Skin: Negative for rash.  Neurological: Negative for dizziness, tremors, speech difficulty and weakness.  Psychiatric/Behavioral: Positive for sleep disturbance. Negative for agitation, dysphoric mood and suicidal ideas. The patient is not nervous/anxious.     Objective:  BP 128/66 (BP Location: Left Arm, Patient Position: Sitting, Cuff Size: Normal)   Pulse (!) 58   Temp 97.7 F (36.5 C) (Oral)   Ht 5\' 6"  (1.676 m)   Wt 133 lb (60.3 kg)   SpO2 98%   BMI 21.47 kg/m   BP Readings from Last 3 Encounters:  07/30/18 128/66  05/14/18 (!) 130/50  01/28/18 130/72    Wt Readings from Last 3 Encounters:  07/30/18 133 lb (60.3 kg)  05/14/18 134 lb 6.4 oz (61 kg)  01/28/18 130  lb (59 kg)    Physical Exam Constitutional:      General: He is not in acute distress.    Appearance: He is well-developed.     Comments: NAD  Eyes:     Conjunctiva/sclera: Conjunctivae normal.     Pupils: Pupils are equal, round, and reactive to light.  Neck:     Musculoskeletal: Normal range of motion.     Thyroid: No thyromegaly.     Vascular: No JVD.  Cardiovascular:     Rate and Rhythm: Normal rate and regular rhythm.     Heart sounds: Normal heart sounds. No murmur. No friction rub. No gallop.   Pulmonary:     Effort: Pulmonary effort is normal. No respiratory distress.     Breath sounds: Normal breath sounds. No  wheezing or rales.  Chest:     Chest wall: No tenderness.  Abdominal:     General: Bowel sounds are normal. There is no distension.     Palpations: Abdomen is soft. There is no mass.     Tenderness: There is no abdominal tenderness. There is no guarding or rebound.  Musculoskeletal: Normal range of motion.        General: No tenderness.  Lymphadenopathy:     Cervical: No cervical adenopathy.  Skin:    General: Skin is warm and dry.     Findings: No rash.  Neurological:     Mental Status: He is alert and oriented to person, place, and time.     Cranial Nerves: No cranial nerve deficit.     Motor: No abnormal muscle tone.     Coordination: Coordination normal.     Gait: Gait normal.     Deep Tendon Reflexes: Reflexes are normal and symmetric.  Psychiatric:        Behavior: Behavior normal.        Thought Content: Thought content normal.        Judgment: Judgment normal.     Lab Results  Component Value Date   WBC 7.7 07/27/2017   HGB 13.2 07/27/2017   HCT 39.7 07/27/2017   PLT 218.0 07/27/2017   GLUCOSE 110 (H) 10/30/2017   CHOL 154 07/27/2017   TRIG 185.0 (H) 07/27/2017   HDL 34.30 (L) 07/27/2017   LDLCALC 83 07/27/2017   ALT 8 07/27/2017   AST 14 07/27/2017   NA 142 10/30/2017   K 4.9 10/30/2017   CL 107 (H) 10/30/2017   CREATININE 1.62 (H) 10/30/2017   BUN 30 (H) 10/30/2017   CO2 25 10/30/2017   TSH 3.13 07/27/2017   PSA 3.62 07/27/2017   INR 1.49 04/18/2010   HGBA1C 5.7 03/11/2014    No results found.  Assessment & Plan:   There are no diagnoses linked to this encounter.   No orders of the defined types were placed in this encounter.    Follow-up: No follow-ups on file.  Walker Kehr, MD

## 2018-07-30 NOTE — Assessment & Plan Note (Signed)
TSH 

## 2018-07-30 NOTE — Assessment & Plan Note (Signed)
No angina Cont w/meds

## 2018-07-30 NOTE — Assessment & Plan Note (Signed)
F/u w/Urology Declined PSA

## 2018-07-30 NOTE — Assessment & Plan Note (Signed)
Doppler q 12 mo

## 2018-07-31 ENCOUNTER — Other Ambulatory Visit: Payer: Self-pay | Admitting: Internal Medicine

## 2018-07-31 DIAGNOSIS — E785 Hyperlipidemia, unspecified: Secondary | ICD-10-CM

## 2018-07-31 DIAGNOSIS — E039 Hypothyroidism, unspecified: Secondary | ICD-10-CM

## 2018-07-31 DIAGNOSIS — D649 Anemia, unspecified: Secondary | ICD-10-CM

## 2018-08-04 ENCOUNTER — Other Ambulatory Visit: Payer: Self-pay | Admitting: Internal Medicine

## 2018-08-04 DIAGNOSIS — I359 Nonrheumatic aortic valve disorder, unspecified: Secondary | ICD-10-CM

## 2018-08-04 DIAGNOSIS — I10 Essential (primary) hypertension: Secondary | ICD-10-CM

## 2018-08-04 DIAGNOSIS — I25709 Atherosclerosis of coronary artery bypass graft(s), unspecified, with unspecified angina pectoris: Secondary | ICD-10-CM

## 2018-08-17 ENCOUNTER — Other Ambulatory Visit: Payer: Self-pay | Admitting: Internal Medicine

## 2018-08-17 DIAGNOSIS — I359 Nonrheumatic aortic valve disorder, unspecified: Secondary | ICD-10-CM

## 2018-08-17 DIAGNOSIS — I25709 Atherosclerosis of coronary artery bypass graft(s), unspecified, with unspecified angina pectoris: Secondary | ICD-10-CM

## 2018-08-17 DIAGNOSIS — I10 Essential (primary) hypertension: Secondary | ICD-10-CM

## 2018-09-25 ENCOUNTER — Other Ambulatory Visit (HOSPITAL_COMMUNITY): Payer: Medicare Other

## 2018-09-25 ENCOUNTER — Encounter (HOSPITAL_COMMUNITY): Payer: Medicare Other

## 2018-10-08 ENCOUNTER — Other Ambulatory Visit: Payer: Self-pay | Admitting: Cardiovascular Disease

## 2018-10-08 ENCOUNTER — Other Ambulatory Visit: Payer: Self-pay | Admitting: Internal Medicine

## 2018-10-08 DIAGNOSIS — I359 Nonrheumatic aortic valve disorder, unspecified: Secondary | ICD-10-CM

## 2018-10-08 DIAGNOSIS — I25709 Atherosclerosis of coronary artery bypass graft(s), unspecified, with unspecified angina pectoris: Secondary | ICD-10-CM

## 2018-10-08 DIAGNOSIS — I10 Essential (primary) hypertension: Secondary | ICD-10-CM

## 2018-10-10 ENCOUNTER — Other Ambulatory Visit: Payer: Self-pay | Admitting: Cardiovascular Disease

## 2018-10-10 DIAGNOSIS — I25709 Atherosclerosis of coronary artery bypass graft(s), unspecified, with unspecified angina pectoris: Secondary | ICD-10-CM

## 2018-10-10 DIAGNOSIS — I10 Essential (primary) hypertension: Secondary | ICD-10-CM

## 2018-10-10 DIAGNOSIS — I359 Nonrheumatic aortic valve disorder, unspecified: Secondary | ICD-10-CM

## 2018-11-13 ENCOUNTER — Ambulatory Visit: Payer: Medicare Other | Admitting: Physician Assistant

## 2018-11-19 ENCOUNTER — Telehealth: Payer: Self-pay | Admitting: Family

## 2018-11-19 MED ORDER — TRAZODONE HCL 50 MG PO TABS: 25.0000 mg | ORAL_TABLET | Freq: Every evening | ORAL | 1 refills | Status: DC | PRN

## 2018-11-19 NOTE — Telephone Encounter (Signed)
Patient called on 11/18/2018 with concerns for problems with chronic insomnia; notes he has had problem for "many years." does have documented sleep apnea but does not find his CPAP beneficial. Wants to try something other than OTC medications. Mentions that his son has similar problems and has done well on Amitriptyline; wonders about this as option.   Discussed option of trying low-dose Trazodone as option first and he agrees to try; he will call his PCP back with concerns/ questions; prefers no office visit at this time due to [place] concerns.

## 2018-11-19 NOTE — Telephone Encounter (Signed)
Spoke with patient and info given 

## 2018-11-19 NOTE — Telephone Encounter (Signed)
Please let him know that I am sorry I didn't get the Trazodone sent in that we discussed yesterday; it's at the pharmacy now; start with 1/2 tablet to see how he responds- can increase to full tablet if no response. Let us know how he does.

## 2019-01-07 ENCOUNTER — Other Ambulatory Visit: Payer: Self-pay | Admitting: Family

## 2019-01-21 ENCOUNTER — Other Ambulatory Visit: Payer: Self-pay

## 2019-01-21 ENCOUNTER — Encounter: Payer: Self-pay | Admitting: Internal Medicine

## 2019-01-21 ENCOUNTER — Ambulatory Visit (INDEPENDENT_AMBULATORY_CARE_PROVIDER_SITE_OTHER): Payer: Medicare Other | Admitting: Internal Medicine

## 2019-01-21 VITALS — BP 110/60 | HR 69 | Temp 98.4°F | Ht 66.0 in | Wt 131.0 lb

## 2019-01-21 DIAGNOSIS — M79601 Pain in right arm: Secondary | ICD-10-CM

## 2019-01-21 NOTE — Patient Instructions (Signed)
Change the bandage every 2 days or so. You can use antibiotic ointment under the bandage to help keep it from sticking and we have given you the prescription for the xeroform dressing.

## 2019-01-21 NOTE — Assessment & Plan Note (Signed)
Rx for xeroform dressing given to patient and wound dressed in the office. Continue with changing bandages every 2 days. Advised that they can soak bandage with water prior to removing to keep it from sticking also.

## 2019-01-21 NOTE — Progress Notes (Signed)
   Subjective:   Patient ID: [name], male    DOB: [birth year], 85 y.o.   MRN: [id number]  HPI The patient is an 85 YO man coming in for skin tear on right arm. He did scrape it on the car door. The skin tore and was bleeding. Did this about 1 week ago. Overall it is not healing right. Has tried bandages but they stick to the wound and tear it when they change bandage. Change the bandage every 2 days or so. Denies fevers or chills. Denies cough or SOB. Does take daily aspirin. Denies seepage or redness around the wound. Needs rx for xeroform dressings at the pharmacy and has used these before for similar and it worked well.   Review of Systems  Constitutional: Negative.   HENT: Negative.   Eyes: Negative.   Respiratory: Negative for cough, chest tightness and shortness of breath.   Cardiovascular: Negative for chest pain, palpitations and leg swelling.  Gastrointestinal: Negative for abdominal distention, abdominal pain, constipation, diarrhea, nausea and vomiting.  Musculoskeletal: Negative.   Skin: Positive for wound.  Neurological: Negative.   Psychiatric/Behavioral: Negative.     Objective:  Physical Exam Constitutional:      Appearance: He is well-developed.  HENT:     Head: Normocephalic and atraumatic.  Neck:     Musculoskeletal: Normal range of motion.  Cardiovascular:     Rate and Rhythm: Normal rate and regular rhythm.  Pulmonary:     Effort: Pulmonary effort is normal. No respiratory distress.     Breath sounds: Normal breath sounds. No wheezing or rales.  Abdominal:     General: Bowel sounds are normal. There is no distension.     Palpations: Abdomen is soft.     Tenderness: There is no abdominal tenderness. There is no rebound.  Skin:    General: Skin is warm and dry.     Comments: Skin tear by right elbow about 3-4 cm long without drainage. Mild oozing clear to slightly bloody fluid. No rash surrounding or swelling.  Neurological:     Mental Status: He is  alert and oriented to person, place, and time.     Coordination: Coordination normal.     Vitals:   01/21/19 1117  BP: 110/60  Pulse: 69  Temp: 98.4 F (36.9 C)  TempSrc: Oral  SpO2: 97%  Weight: 131 lb (59.4 kg)  Height: 5\' 6"  (1.676 m)    Assessment & Plan:

## 2019-03-15 ENCOUNTER — Other Ambulatory Visit: Payer: Self-pay | Admitting: Family

## 2019-03-15 ENCOUNTER — Other Ambulatory Visit: Payer: Self-pay

## 2019-03-15 MED ORDER — TRAZODONE HCL 50 MG PO TABS: ORAL_TABLET | ORAL | 0 refills | Status: DC

## 2019-03-27 DIAGNOSIS — H5703 Miosis: Secondary | ICD-10-CM | POA: Diagnosis not present

## 2019-03-27 DIAGNOSIS — H2181 Floppy iris syndrome: Secondary | ICD-10-CM | POA: Diagnosis not present

## 2019-03-27 DIAGNOSIS — H25813 Combined forms of age-related cataract, bilateral: Secondary | ICD-10-CM | POA: Diagnosis not present

## 2019-04-21 ENCOUNTER — Ambulatory Visit (INDEPENDENT_AMBULATORY_CARE_PROVIDER_SITE_OTHER): Payer: Medicare Other

## 2019-04-21 DIAGNOSIS — Z23 Encounter for immunization: Secondary | ICD-10-CM | POA: Diagnosis not present

## 2019-05-03 ENCOUNTER — Other Ambulatory Visit: Payer: Self-pay | Admitting: Family Medicine

## 2019-05-05 ENCOUNTER — Other Ambulatory Visit: Payer: Self-pay | Admitting: Family

## 2019-05-05 NOTE — Telephone Encounter (Signed)
Patient only had one tablet left.

## 2019-05-06 ENCOUNTER — Other Ambulatory Visit: Payer: Self-pay | Admitting: Family

## 2019-05-06 MED ORDER — TRAZODONE HCL 50 MG PO TABS: ORAL_TABLET | ORAL | 1 refills | Status: DC

## 2019-06-27 ENCOUNTER — Encounter: Payer: Self-pay | Admitting: Cardiovascular Disease

## 2019-06-27 ENCOUNTER — Other Ambulatory Visit: Payer: Self-pay

## 2019-06-27 ENCOUNTER — Ambulatory Visit: Payer: Medicare Other | Admitting: Cardiovascular Disease

## 2019-06-27 VITALS — BP 132/60 | HR 62 | Ht 66.0 in | Wt 130.0 lb

## 2019-06-27 DIAGNOSIS — I350 Nonrheumatic aortic (valve) stenosis: Secondary | ICD-10-CM | POA: Diagnosis not present

## 2019-06-27 DIAGNOSIS — I2510 Atherosclerotic heart disease of native coronary artery without angina pectoris: Secondary | ICD-10-CM

## 2019-06-27 DIAGNOSIS — I10 Essential (primary) hypertension: Secondary | ICD-10-CM | POA: Diagnosis not present

## 2019-06-27 DIAGNOSIS — E782 Mixed hyperlipidemia: Secondary | ICD-10-CM

## 2019-06-27 DIAGNOSIS — I739 Peripheral vascular disease, unspecified: Secondary | ICD-10-CM | POA: Diagnosis not present

## 2019-06-27 NOTE — Progress Notes (Signed)
Cardiology Office Note:    Date:  06/27/2019   ID:  [name], DOB 11-29-1928, MRN [id number]  PCP:  Cassandria Anger, MD  Cardiologist:  Sherren Mocha, MD  Electrophysiologist:  None   Referring MD: Cassandria Anger, MD   Chief Complaint  Patient presents with  . Coronary Artery Disease    History of Present Illness:    [name] is a 85 y.o. male with a hx of coronary artery disease, presenting for follow-up evaluation.  The patient has a history of remote CABG in 1995.  He underwent PCI of the saphenous vein graft to obtuse marginal in 2008.  He ultimately developed aortic stenosis and was treated with bioprosthetic aortic valve replacement in 2011.  Comorbid conditions include renal artery stenosis and peripheral arterial disease.  He has more recently been followed for moderate bioprosthetic aortic valve stenosis.  The patient is here alone today.  He denies any interval medical events since his last visit here 1 year ago.  He remains reasonably active with regular outdoor work in his yard.  With that level of activity he denies shortness of breath, chest pain, or lightheadedness.  He has some problems with his legs that limit his walking.  His medications are unchanged and he reports compliance with them.  Denies any excessive bleeding or bruising.  No heart palpitations, orthopnea, PND, or leg swelling.  Past Medical History:  Diagnosis Date  . Aortic valve disorders    Echo 3/19: Mild LVH, EF 65-70, dynamic obstruction during Valsalva in mid cavity with mid cavity obliteration (peak velocity 200 cm/s, peak gradient 16 mmHg), normal wall motion, grade 1 diastolic dysfunction, AVR with mild stenosis (mean gradient 32, peak 56), MAC, mild to moderate MR, mild LAE  . Atherosclerosis of renal artery ([place])   . Bilateral renal artery stenosis (H[facility])    documented at the time of angiography by Dr. Albertine Patricia  . BPH (benign prostatic hypertrophy)   . Carotid artery disease  ([place])    Carotid US 3/19: Bilateral ICA 40-59, follow-up 1 year  . Cerebrovascular disease, unspecified   . Coronary atherosclerosis of unspecified type of vessel, native or graft   . History of cystoscopy    w/ bladder biopsy  . History of echocardiogram    Echo 3/19: mild LVH, EF 65-70, dynamic obstruction during Valsalva with mid cavity obliteration (peak velocity of 200 cm/sec - peak gradient 16 mmHg), no RWMA, Gr 1 DD, s/p AVR, mild AS (mean 32, peak 56), MAC, mild to mod MR, mild LAE  . Malignant neoplasm of bladder, part unspecified   . Melanoma in situ of back ([place]) 2012   area of left scapula  . Old myocardial infarction   . Other and unspecified hyperlipidemia   . Peripheral vascular disease, unspecified ([place])    history of stent left lower extremity placed by Dr. Albertine Patricia  . Postsurgical aortocoronary bypass status   . Postsurgical percutaneous transluminal coronary angioplasty status   . Scarlet fever   . Unspecified sleep apnea     Past Surgical History:  Procedure Laterality Date  . CORONARY ARTERY BYPASS GRAFT    . CYSTOSCOPY     w/ bladder biopsy  . MELANOMA EXCISION  2012   left scapular  area - clean margins.   . Percutaneous transluminal coronary angioplasty    . redo median sternotomy     extracorporeal circulation, aortic valve replacement using a 23-mm Mitroflow aortic pericardial heart valve.  Surgeon- Gilford Raid,  MD 04-18-10    Current Medications: Current Meds  Medication Sig  . amLODipine (NORVASC) 10 MG tablet TAKE ONE TABLET BY MOUTH DAILY. PLEASE CALL AND SCHEDULE AN APPOINTMENT FOR FURTHER REFILLS  . Ascorbic Acid (VITAMIN C PO) Take 1 capsule by mouth daily.  Marland Kitchen aspirin EC 81 MG tablet Take 1 tablet (81 mg total) by mouth daily.  . B Complex-C (SUPER B COMPLEX PO) Take 1 tablet by mouth daily.  . cholecalciferol (VITAMIN D) 1000 UNITS tablet Take 1,000 Units by mouth daily.  . fish oil-omega-3 fatty acids 1000 MG capsule Take 1 g by mouth 2 (two)  times daily.   Marland Kitchen lisinopril (PRINIVIL,ZESTRIL) 20 MG tablet TAKE 1 TABLET BY MOUTH TWO TIMES A DAY  . loperamide (IMODIUM A-D) 2 MG tablet Take 1 tablet (2 mg total) by mouth 4 (four) times daily as needed for diarrhea or loose stools.  . Magnesium 400 MG TABS Take 400 mg by mouth daily.  . metoprolol succinate (TOPROL-XL) 25 MG 24 hr tablet TAKE 1 TABLET BY MOUTH DAILY  . multivitamin (THERAGRAN) tablet Take 1 tablet by mouth daily.    . nitroGLYCERIN (NITROSTAT) 0.4 MG SL tablet Place 1 tablet (0.4 mg total) under the tongue every 5 (five) minutes as needed for chest pain (Max 3 doses within 15 minutes call 911).  . saccharomyces boulardii (FLORASTOR) 250 MG capsule Take 1 capsule (250 mg total) by mouth 2 (two) times daily.  . simvastatin (ZOCOR) 20 MG tablet TAKE 1 TABLET BY MOUTH AT BEDTIME  . tamsulosin (FLOMAX) 0.4 MG CAPS capsule Take 2 capsules (0.8 mg total) by mouth daily.  . traZODone (DESYREL) 50 MG tablet TAKE 0.5 TO 1 TABLET BY MOUTH EVERY NIGHT AT BEDTIME AS NEEDED FOR SLEEP     Allergies:   Sulfonamide derivatives   Social History   Socioeconomic History  . Marital status: Married    Spouse name: Not on file  . Number of children: 3  . Years of education: 68  . Highest education level: Not on file  Occupational History  . Occupation: Lobbyist: RETIRED  . Occupation: retired  Tobacco Use  . Smoking status: Never Smoker  . Smokeless tobacco: Never Used  Substance and Sexual Activity  . Alcohol use: Yes    Comment: rare  . Drug use: No  . Sexual activity: Not Currently  Other Topics Concern  . Not on file  Social History Narrative   HSG, [place], did some graduate work. Married '52. 1 son - '61; 2 daughters -'60, '63: grandchildren 55; great-grands 65. 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 Active gardner, Designer, jewellery. good marriage.   Social Determinants of Health   Financial Resource Strain:   . Difficulty of Paying Living  Expenses: Not on file  Food Insecurity:   . Worried About Charity fundraiser in the Last Year: Not on file  . Ran Out of Food in the Last Year: Not on file  Transportation Needs:   . Lack of Transportation (Medical): Not on file  . Lack of Transportation (Non-Medical): Not on file  Physical Activity:   . Days of Exercise per Week: Not on file  . Minutes of Exercise per Session: Not on file  Stress:   . Feeling of Stress : Not on file  Social Connections:   . Frequency of Communication with Friends and Family: Not on file  . Frequency of Social Gatherings with Friends and Family: Not on file  .  Attends Religious Services: Not on file  . Active Member of Clubs or Organizations: Not on file  . Attends Archivist Meetings: Not on file  . Marital Status: Not on file     Family History: The patient's family history includes Cancer in an other family member; Coronary artery disease in his father and mother; Diabetes in his mother; Heart attack in his father; Heart disease in his father and mother; Hyperlipidemia in his mother; Hypertension in his mother; Stroke in his mother; Sudden death in his father. There is no history of Birth defects.  ROS:   Please see the history of present illness.    All other systems reviewed and are negative.  EKGs/Labs/Other Studies Reviewed:    The following studies were reviewed today: Echo 10/05/2017" Study Conclusions  - Left ventricle: Wall thickness was increased in a pattern of mild   LVH. There was mild concentric hypertrophy. Systolic function was   hyperdynamic. The estimated ejection fraction was in the range of   65% to 70%. There was dynamic obstruction during Valsalvain the   mid cavity, with mid-cavity obliteration, a peak velocity of 200   cm/sec, and a peak gradient of 16 mm Hg. Wall motion was normal;   there were no regional wall motion abnormalities. Doppler   parameters are consistent with abnormal left ventricular    relaxation (grade 1 diastolic dysfunction). Doppler parameters   are borderline for elevated mean left atrial filling pressure. - Ventricular septum: Septal motion showed paradox. These changes   are consistent with a post-thoracotomy state. - Aortic valve: A bioprosthesis was present. Transvalvular velocity   was increased more than expected, due to high cardiac output.   There was mild stenosis. Mean gradient (S): 32 mm Hg. Peak   gradient (S): 56 mm Hg. Valve area (VTI): 1.37 cm^2. Valve area   (Vmax): 1.2 cm^2. Valve area (Vmean): 1.26 cm^2. - Mitral valve: Calcified annulus. There was mild to moderate   regurgitation directed centrally. Diastolic regurgitation was   present. - Left atrium: The atrium was mildly dilated.  Impressions:  - Aortic bioprosthesis gradients are unchanged from last year. At   least in part, the elevated gradients are related to hyperdynamic   LV function and high stroke volume.   EKG:  EKG is  ordered today.  The ekg ordered today demonstrates normal sinus rhythm 62 bpm.  Right bundle branch block.  First-degree AV block.  No change from previous tracing.  Recent Labs: 07/30/2018: ALT 13; BUN 28; Creatinine, Ser 1.44; Hemoglobin 12.8; Platelets 145.0; Potassium 4.6; Sodium 141; TSH 4.75  Recent Lipid Panel    Component Value Date/Time   CHOL 164 07/30/2018 1101   TRIG 149.0 07/30/2018 1101   HDL 51.00 07/30/2018 1101   CHOLHDL 3 07/30/2018 1101   VLDL 29.8 07/30/2018 1101   LDLCALC 83 07/30/2018 1101    Physical Exam:    VS:  BP 132/60   Pulse 62   Ht _0  (1.676 m)   Wt 130 lb (59 kg)   BMI 20.98 kg/m     Wt Readings from Last 3 Encounters:  06/27/19 130 lb (59 kg)  01/21/19 131 lb (59.4 kg)  07/30/18 133 lb (60.3 kg)     GEN:  Well nourished, well developed elderly male in no acute distress HEENT: Normal NECK: No JVD; bilateral carotid bruits LYMPHATICS: No lymphadenopathy CARDIAC: RRR, 2/6 harsh mid peaking systolic murmur  best heard at the left lower sternal border RESPIRATORY:  Clear to auscultation without rales, wheezing or rhonchi  ABDOMEN: Soft, non-tender, non-distended MUSCULOSKELETAL:  No edema; No deformity  SKIN: Warm and dry NEUROLOGIC:  Alert and oriented x 3 PSYCHIATRIC:  Normal affect   ASSESSMENT:    1. Aortic valve stenosis, etiology of cardiac valve disease unspecified   2. PAD (peripheral artery disease) ([place])   3. Coronary artery disease involving native coronary artery of native heart without angina pectoris   4. Essential hypertension   5. Mixed hyperlipidemia    PLAN:    In order of problems listed above:  1. Patient's previous echo studies reviewed with elevated mean transvalvular gradients of up to 32 mmHg following bioprosthetic aortic valve replacement.  This is at least partly felt to be due to high flow and vigorous LV function.  His murmur is not suggestive of severe aortic stenosis.  His last echo was done in March 2019.  We will order an updated echo study.  He has New York Heart Association functional class I symptoms. 2. Some limitation in both legs.  He thinks that most of this is related to his hips.  Continue medical therapy. 3. No anginal symptoms.  Medical program reviewed and includes aspirin for antiplatelet therapy, a beta-blocker, and ACE inhibitor, and a statin drug. 4. Blood pressure is well controlled on multidrug antihypertensive therapy.  Last labs reviewed with evidence of stage III chronic kidney disease with creatinine 1.44 mg/dL. 5. Cholesterol 164, HDL 51, LDL 83.  While he is above goal with LDL greater than 70, I am not inclined to change his medical program at age 68.  He seems to be stable and doing quite well.   Medication Adjustments/Labs and Tests Ordered: Current medicines are reviewed at length with the patient today.  Concerns regarding medicines are outlined above.  Orders Placed This Encounter  Procedures  . EKG 12-Lead  . ECHOCARDIOGRAM  COMPLETE   No orders of the defined types were placed in this encounter.   Patient Instructions  Medication Instructions:  Your provider recommends that you continue on your current medications as directed. Please refer to the Current Medication list given to you today.   *If you need a refill on your cardiac medications before your next appointment, please call your pharmacy*  Testing/Procedures: Your provider has requested that you have an echocardiogram. Echocardiography is a painless test that uses sound waves to create images of your heart. It provides your doctor with information about the size and shape of your heart and how well your heart's chambers and valves are working. This procedure takes approximately one hour. There are no restrictions for this procedure.  Follow-Up: At [facility], you and your health needs are our priority.  As part of our continuing mission to provide you with exceptional heart care, we have created designated Provider Care Teams.  These Care Teams include your primary Cardiologist (physician) and Advanced Practice Providers (APPs -  Physician Assistants and Nurse Practitioners) who all work together to provide you with the care you need, when you need it. Your next appointment:   12 month(s) The format for your next appointment:   In Person Provider:   You may see Sherren Mocha, MD or one of the following Advanced Practice Providers on your designated Care Team:    Richardson Dopp, PA-C  Vin Shannon, PA-C  Daune Perch, [place]    Signed, Sherren Mocha, MD  06/27/2019 1:29 PM    [place]

## 2019-06-27 NOTE — Patient Instructions (Signed)
Medication Instructions:  Your provider recommends that you continue on your current medications as directed. Please refer to the Current Medication list given to you today.   *If you need a refill on your cardiac medications before your next appointment, please call your pharmacy*  Testing/Procedures: Your provider has requested that you have an echocardiogram. Echocardiography is a painless test that uses sound waves to create images of your heart. It provides your doctor with information about the size and shape of your heart and how well your heart's chambers and valves are working. This procedure takes approximately one hour. There are no restrictions for this procedure.  Follow-Up: At [facility], you and your health needs are our priority.  As part of our continuing mission to provide you with exceptional heart care, we have created designated Provider Care Teams.  These Care Teams include your primary Cardiologist (physician) and Advanced Practice Providers (APPs -  Physician Assistants and Nurse Practitioners) who all work together to provide you with the care you need, when you need it. Your next appointment:   12 month(s) The format for your next appointment:   In Person Provider:   You may see Michael Cooper, MD or one of the following Advanced Practice Providers on your designated Care Team:    Scott Weaver, PA-C  Vin Bhagat, PA-C  Janine Hammond, NP 

## 2019-07-07 ENCOUNTER — Other Ambulatory Visit: Payer: Self-pay

## 2019-07-07 ENCOUNTER — Ambulatory Visit (HOSPITAL_COMMUNITY): Payer: Medicare Other | Attending: Cardiovascular Disease

## 2019-07-07 DIAGNOSIS — I350 Nonrheumatic aortic (valve) stenosis: Secondary | ICD-10-CM | POA: Diagnosis not present

## 2019-07-09 ENCOUNTER — Other Ambulatory Visit: Payer: Self-pay | Admitting: Internal Medicine

## 2019-07-09 DIAGNOSIS — I25709 Atherosclerosis of coronary artery bypass graft(s), unspecified, with unspecified angina pectoris: Secondary | ICD-10-CM

## 2019-07-09 DIAGNOSIS — I359 Nonrheumatic aortic valve disorder, unspecified: Secondary | ICD-10-CM

## 2019-07-09 DIAGNOSIS — I10 Essential (primary) hypertension: Secondary | ICD-10-CM

## 2019-07-14 ENCOUNTER — Other Ambulatory Visit: Payer: Self-pay | Admitting: Internal Medicine

## 2019-07-14 DIAGNOSIS — I359 Nonrheumatic aortic valve disorder, unspecified: Secondary | ICD-10-CM

## 2019-07-14 DIAGNOSIS — I10 Essential (primary) hypertension: Secondary | ICD-10-CM

## 2019-07-14 DIAGNOSIS — I25709 Atherosclerosis of coronary artery bypass graft(s), unspecified, with unspecified angina pectoris: Secondary | ICD-10-CM

## 2019-07-23 ENCOUNTER — Other Ambulatory Visit: Payer: Self-pay | Admitting: Internal Medicine

## 2019-07-23 DIAGNOSIS — I10 Essential (primary) hypertension: Secondary | ICD-10-CM

## 2019-07-23 DIAGNOSIS — I25709 Atherosclerosis of coronary artery bypass graft(s), unspecified, with unspecified angina pectoris: Secondary | ICD-10-CM

## 2019-07-23 DIAGNOSIS — I359 Nonrheumatic aortic valve disorder, unspecified: Secondary | ICD-10-CM

## 2019-08-09 ENCOUNTER — Ambulatory Visit: Payer: Medicare Other | Attending: Internal Medicine

## 2019-08-09 DIAGNOSIS — Z23 Encounter for immunization: Secondary | ICD-10-CM

## 2019-08-09 NOTE — Progress Notes (Signed)
   Covid-19 Vaccination Clinic  Name:  [name]    MRN: [id number] DOB: [birth year]  08/09/2019  [name] was observed post Covid-19 immunization for 15 minutes without incidence. He was provided with Vaccine Information Sheet and instruction to access the V-Safe system.   [name] was instructed to call 911 with any severe reactions post vaccine: Marland Kitchen Difficulty breathing  . Swelling of your face and throat  . A fast heartbeat  . A bad rash all over your body  . Dizziness and weakness    Immunizations Administered    Name Date Dose VIS Date Route   Pfizer COVID-19 Vaccine 08/09/2019 11:47 AM 0.3 mL 06/27/2019 Intramuscular   Manufacturer: [place]   Lot: [id number]   [place]: [id number]

## 2019-08-12 ENCOUNTER — Ambulatory Visit: Payer: Medicare Other | Admitting: Internal Medicine

## 2019-08-12 ENCOUNTER — Other Ambulatory Visit: Payer: Self-pay

## 2019-08-12 ENCOUNTER — Encounter: Payer: Self-pay | Admitting: Internal Medicine

## 2019-08-12 VITALS — BP 126/72 | HR 62 | Temp 98.2°F | Ht 66.0 in | Wt 132.0 lb

## 2019-08-12 DIAGNOSIS — I10 Essential (primary) hypertension: Secondary | ICD-10-CM

## 2019-08-12 DIAGNOSIS — I2510 Atherosclerotic heart disease of native coronary artery without angina pectoris: Secondary | ICD-10-CM | POA: Diagnosis not present

## 2019-08-12 DIAGNOSIS — I359 Nonrheumatic aortic valve disorder, unspecified: Secondary | ICD-10-CM

## 2019-08-12 DIAGNOSIS — G4700 Insomnia, unspecified: Secondary | ICD-10-CM | POA: Diagnosis not present

## 2019-08-12 DIAGNOSIS — E785 Hyperlipidemia, unspecified: Secondary | ICD-10-CM

## 2019-08-12 DIAGNOSIS — R972 Elevated prostate specific antigen [PSA]: Secondary | ICD-10-CM

## 2019-08-12 DIAGNOSIS — Z0000 Encounter for general adult medical examination without abnormal findings: Secondary | ICD-10-CM | POA: Diagnosis not present

## 2019-08-12 DIAGNOSIS — I25709 Atherosclerosis of coronary artery bypass graft(s), unspecified, with unspecified angina pectoris: Secondary | ICD-10-CM

## 2019-08-12 LAB — CBC WITH DIFFERENTIAL/PLATELET
Basophils Absolute: 0 10*3/uL (ref 0.0–0.1)
Basophils Relative: 0.4 % (ref 0.0–3.0)
Eosinophils Absolute: 0.1 10*3/uL (ref 0.0–0.7)
Eosinophils Relative: 1.9 % (ref 0.0–5.0)
HCT: 38.9 % — ABNORMAL LOW (ref 39.0–52.0)
Hemoglobin: 12.9 g/dL — ABNORMAL LOW (ref 13.0–17.0)
Lymphocytes Relative: 22 % (ref 12.0–46.0)
Lymphs Abs: 1.5 10*3/uL (ref 0.7–4.0)
MCHC: 33.2 g/dL (ref 30.0–36.0)
MCV: 93.3 fl (ref 78.0–100.0)
Monocytes Absolute: 0.7 10*3/uL (ref 0.1–1.0)
Monocytes Relative: 10.6 % (ref 3.0–12.0)
Neutro Abs: 4.4 10*3/uL (ref 1.4–7.7)
Neutrophils Relative %: 65.1 % (ref 43.0–77.0)
Platelets: 153 10*3/uL (ref 150.0–400.0)
RBC: 4.16 Mil/uL — ABNORMAL LOW (ref 4.22–5.81)
RDW: 14.1 % (ref 11.5–15.5)
WBC: 6.7 10*3/uL (ref 4.0–10.5)

## 2019-08-12 LAB — URINALYSIS, ROUTINE W REFLEX MICROSCOPIC
Bilirubin Urine: NEGATIVE
Hgb urine dipstick: NEGATIVE
Ketones, ur: NEGATIVE
Leukocytes,Ua: NEGATIVE
Nitrite: NEGATIVE
RBC / HPF: NONE SEEN (ref 0–?)
Specific Gravity, Urine: 1.025 (ref 1.000–1.030)
Total Protein, Urine: 30 — AB
Urine Glucose: NEGATIVE
Urobilinogen, UA: 0.2 (ref 0.0–1.0)
pH: 5.5 (ref 5.0–8.0)

## 2019-08-12 LAB — BASIC METABOLIC PANEL
BUN: 38 mg/dL — ABNORMAL HIGH (ref 6–23)
CO2: 25 mEq/L (ref 19–32)
Calcium: 9 mg/dL (ref 8.4–10.5)
Chloride: 110 mEq/L (ref 96–112)
Creatinine, Ser: 1.48 mg/dL (ref 0.40–1.50)
GFR: 44.82 mL/min — ABNORMAL LOW (ref >60.00)
Glucose, Bld: 96 mg/dL (ref 70–99)
Potassium: 4.7 mEq/L (ref 3.5–5.1)
Sodium: 141 mEq/L (ref 135–145)

## 2019-08-12 LAB — PSA: PSA: 2.08 ng/mL (ref 0.10–4.00)

## 2019-08-12 LAB — HEPATIC FUNCTION PANEL
ALT: 14 U/L (ref 0–53)
AST: 21 U/L (ref 0–37)
Albumin: 4 g/dL (ref 3.5–5.2)
Alkaline Phosphatase: 54 U/L (ref 39–117)
Bilirubin, Direct: 0.1 mg/dL (ref 0.0–0.3)
Total Bilirubin: 0.3 mg/dL (ref 0.2–1.2)
Total Protein: 6 g/dL (ref 6.0–8.3)

## 2019-08-12 LAB — LIPID PANEL
Cholesterol: 172 mg/dL (ref 0–200)
HDL: 51.7 mg/dL (ref >39.00)
LDL Cholesterol: 95 mg/dL (ref 0–99)
NonHDL: 120.65
Total CHOL/HDL Ratio: 3
Triglycerides: 128 mg/dL (ref 0.0–149.0)
VLDL: 25.6 mg/dL (ref 0.0–40.0)

## 2019-08-12 LAB — TSH: TSH: 5.37 u[IU]/mL — ABNORMAL HIGH (ref 0.35–4.50)

## 2019-08-12 MED ORDER — NITROGLYCERIN 0.4 MG SL SUBL: 0.4000 mg | SUBLINGUAL_TABLET | SUBLINGUAL | 2 refills | Status: AC | PRN

## 2019-08-12 MED ORDER — AMLODIPINE BESYLATE 10 MG PO TABS: ORAL_TABLET | ORAL | 3 refills | Status: DC

## 2019-08-12 MED ORDER — METOPROLOL SUCCINATE ER 25 MG PO TB24: ORAL_TABLET | ORAL | 3 refills | Status: DC

## 2019-08-12 MED ORDER — LISINOPRIL 20 MG PO TABS: 20.0000 mg | ORAL_TABLET | Freq: Every day | ORAL | 3 refills | Status: DC

## 2019-08-12 MED ORDER — SIMVASTATIN 20 MG PO TABS: 20.0000 mg | ORAL_TABLET | Freq: Every day | ORAL | 3 refills | Status: DC

## 2019-08-12 MED ORDER — TAMSULOSIN HCL 0.4 MG PO CAPS: 0.8000 mg | ORAL_CAPSULE | Freq: Every day | ORAL | 3 refills | Status: DC

## 2019-08-12 NOTE — Assessment & Plan Note (Signed)
We discussed age appropriate health related issues, including available/recomended screening tests and vaccinations. We discussed a need for adhering to healthy diet and exercise. Labs were ordered to be later reviewed . All questions were answered.   

## 2019-08-12 NOTE — Progress Notes (Signed)
Subjective:  Patient ID: [name], male    DOB: 07/09/1929  Age: 86 y.o. MRN: [id number]  CC: No chief complaint on file.   HPI [name] presents for insomnia - worse, HTN, dyslipidemia Well exam  Outpatient Medications Prior to Visit  Medication Sig Dispense Refill  . amLODipine (NORVASC) 10 MG tablet TAKE ONE TABLET BY MOUTH DAILY. PLEASE CALL AND SCHEDULE AN APPOINTMENT FOR FURTHER REFILLS 90 tablet 3  . Ascorbic Acid (VITAMIN C PO) Take 1 capsule by mouth daily.    Marland Kitchen aspirin EC 81 MG tablet Take 1 tablet (81 mg total) by mouth daily. 90 tablet 3  . B Complex-C (SUPER B COMPLEX PO) Take 1 tablet by mouth daily.    . cholecalciferol (VITAMIN D) 1000 UNITS tablet Take 1,000 Units by mouth daily.    . fish oil-omega-3 fatty acids 1000 MG capsule Take 1 g by mouth 2 (two) times daily.     Marland Kitchen lisinopril (ZESTRIL) 20 MG tablet Take 1 tablet (20 mg total) by mouth daily. Annual appt is due must see provider for future refills 60 tablet 0  . loperamide (IMODIUM A-D) 2 MG tablet Take 1 tablet (2 mg total) by mouth 4 (four) times daily as needed for diarrhea or loose stools. 30 tablet 2  . Magnesium 400 MG TABS Take 400 mg by mouth daily.    . metoprolol succinate (TOPROL-XL) 25 MG 24 hr tablet TAKE ONE TABLET BY MOUTH DAILY Annual appt due January must see provider for future refills 30 tablet 0  . multivitamin (THERAGRAN) tablet Take 1 tablet by mouth daily.      . nitroGLYCERIN (NITROSTAT) 0.4 MG SL tablet Place 1 tablet (0.4 mg total) under the tongue every 5 (five) minutes as needed for chest pain (Max 3 doses within 15 minutes call 911). 20 tablet 2  . saccharomyces boulardii (FLORASTOR) 250 MG capsule Take 1 capsule (250 mg total) by mouth 2 (two) times daily. 60 capsule 0  . simvastatin (ZOCOR) 20 MG tablet TAKE 1 TABLET BY MOUTH AT BEDTIME 90 tablet 3  . tamsulosin (FLOMAX) 0.4 MG CAPS capsule Take 2 capsules (0.8 mg total) by mouth daily. 90 capsule 3  . traZODone (DESYREL)  50 MG tablet TAKE 0.5 TO 1 TABLET BY MOUTH EVERY NIGHT AT BEDTIME AS NEEDED FOR SLEEP 30 tablet 1   No facility-administered medications prior to visit.    ROS: Review of Systems  Constitutional: Negative for appetite change, fatigue and unexpected weight change.  HENT: Negative for congestion, nosebleeds, sneezing, sore throat and trouble swallowing.   Eyes: Negative for itching and visual disturbance.  Respiratory: Negative for cough.   Cardiovascular: Negative for chest pain, palpitations and leg swelling.  Gastrointestinal: Negative for abdominal distention, blood in stool, diarrhea and nausea.  Genitourinary: Negative for frequency and hematuria.  Musculoskeletal: Positive for arthralgias and gait problem. Negative for back pain, joint swelling and neck pain.  Skin: Negative for rash.  Neurological: Negative for dizziness, tremors, speech difficulty and weakness.  Psychiatric/Behavioral: Positive for sleep disturbance. Negative for agitation, dysphoric mood and suicidal ideas. The patient is not nervous/anxious.   claudication  Objective:  BP 126/72 (BP Location: Left Arm, Patient Position: Sitting, Cuff Size: Normal)   Pulse 62   Temp 98.2 F (36.8 C) (Oral)   Ht 5\' 6"  (1.676 m)   Wt 132 lb (59.9 kg)   SpO2 99%   BMI 21.31 kg/m   BP Readings from Last 3 Encounters:  08/12/19 126/72  06/27/19 132/60  01/21/19 110/60    Wt Readings from Last 3 Encounters:  08/12/19 132 lb (59.9 kg)  06/27/19 130 lb (59 kg)  01/21/19 131 lb (59.4 kg)    Physical Exam Constitutional:      General: He is not in acute distress.    Appearance: He is well-developed.     Comments: NAD  Eyes:     Conjunctiva/sclera: Conjunctivae normal.     Pupils: Pupils are equal, round, and reactive to light.  Neck:     Thyroid: No thyromegaly.     Vascular: No JVD.  Cardiovascular:     Rate and Rhythm: Normal rate and regular rhythm.     Heart sounds: Normal heart sounds. No murmur. No friction  rub. No gallop.   Pulmonary:     Effort: Pulmonary effort is normal. No respiratory distress.     Breath sounds: Normal breath sounds. No wheezing or rales.  Chest:     Chest wall: No tenderness.  Abdominal:     General: Bowel sounds are normal. There is no distension.     Palpations: Abdomen is soft. There is no mass.     Tenderness: There is no abdominal tenderness. There is no guarding or rebound.  Musculoskeletal:        General: No tenderness. Normal range of motion.     Cervical back: Normal range of motion.  Lymphadenopathy:     Cervical: No cervical adenopathy.  Skin:    General: Skin is warm and dry.     Findings: No rash.  Neurological:     Mental Status: He is alert and oriented to person, place, and time.     Cranial Nerves: No cranial nerve deficit.     Motor: No abnormal muscle tone.     Coordination: Coordination normal.     Gait: Gait normal.     Deep Tendon Reflexes: Reflexes are normal and symmetric.  Psychiatric:        Behavior: Behavior normal.        Thought Content: Thought content normal.        Judgment: Judgment normal.   stiff hips  rectal - NE   Lab Results  Component Value Date   WBC 6.4 07/30/2018   HGB 12.8 (L) 07/30/2018   HCT 38.1 (L) 07/30/2018   PLT 145.0 (L) 07/30/2018   GLUCOSE 101 (H) 07/30/2018   CHOL 164 07/30/2018   TRIG 149.0 07/30/2018   HDL 51.00 07/30/2018   LDLCALC 83 07/30/2018   ALT 13 07/30/2018   AST 18 07/30/2018   NA 141 07/30/2018   K 4.6 07/30/2018   CL 109 07/30/2018   CREATININE 1.44 07/30/2018   BUN 28 (H) 07/30/2018   CO2 24 07/30/2018   TSH 4.75 (H) 07/30/2018   PSA 3.62 07/27/2017   INR 1.49 04/18/2010   HGBA1C 5.7 03/11/2014    No results found.  Assessment & Plan:       Follow-up: No follow-ups on file.  Walker Kehr, MD

## 2019-08-12 NOTE — Assessment & Plan Note (Signed)
Try Valerian root for sleep

## 2019-08-12 NOTE — Patient Instructions (Signed)
Valerian root for insomnia 

## 2019-08-12 NOTE — Addendum Note (Signed)
Addended by: Raliegh Ip on: 08/12/2019 12:12 PM   Modules accepted: Orders

## 2019-08-12 NOTE — Assessment & Plan Note (Signed)
PSA

## 2019-08-12 NOTE — Assessment & Plan Note (Addendum)
No angina ASA, Amlodipine, Toprol, Lisinopril, Zocor

## 2019-08-12 NOTE — Assessment & Plan Note (Signed)
Zocor qd Labs

## 2019-08-13 ENCOUNTER — Other Ambulatory Visit (INDEPENDENT_AMBULATORY_CARE_PROVIDER_SITE_OTHER): Payer: Medicare Other

## 2019-08-13 ENCOUNTER — Other Ambulatory Visit: Payer: Self-pay | Admitting: Internal Medicine

## 2019-08-13 DIAGNOSIS — I25709 Atherosclerosis of coronary artery bypass graft(s), unspecified, with unspecified angina pectoris: Secondary | ICD-10-CM

## 2019-08-13 DIAGNOSIS — I10 Essential (primary) hypertension: Secondary | ICD-10-CM

## 2019-08-13 DIAGNOSIS — I359 Nonrheumatic aortic valve disorder, unspecified: Secondary | ICD-10-CM

## 2019-08-13 DIAGNOSIS — R7989 Other specified abnormal findings of blood chemistry: Secondary | ICD-10-CM

## 2019-08-14 LAB — T4, FREE: Free T4: 0.86 ng/dL (ref 0.60–1.60)

## 2019-08-30 ENCOUNTER — Ambulatory Visit: Payer: Medicare Other | Attending: Internal Medicine

## 2019-08-30 DIAGNOSIS — Z23 Encounter for immunization: Secondary | ICD-10-CM | POA: Insufficient documentation

## 2019-08-30 NOTE — Progress Notes (Signed)
   Covid-19 Vaccination Clinic  Name:  [name]    MRN: [id number] DOB: Feb 15, 1929  08/30/2019  [name] was observed post Covid-19 immunization for 15 minutes without incidence. He was provided with Vaccine Information Sheet and instruction to access the V-Safe system.   [name] was instructed to call 911 with any severe reactions post vaccine: Marland Kitchen Difficulty breathing  . Swelling of your face and throat  . A fast heartbeat  . A bad rash all over your body  . Dizziness and weakness    Immunizations Administered    Name Date Dose VIS Date Route   Pfizer COVID-19 Vaccine 08/30/2019 11:56 AM 0.3 mL 06/27/2019 Intramuscular   Manufacturer: [place]   Lot: [id number]   [place]: [id number]

## 2019-10-06 ENCOUNTER — Other Ambulatory Visit: Payer: Self-pay | Admitting: Physician Assistant

## 2019-10-06 DIAGNOSIS — I25709 Atherosclerosis of coronary artery bypass graft(s), unspecified, with unspecified angina pectoris: Secondary | ICD-10-CM

## 2019-10-06 DIAGNOSIS — I359 Nonrheumatic aortic valve disorder, unspecified: Secondary | ICD-10-CM

## 2019-10-06 DIAGNOSIS — I10 Essential (primary) hypertension: Secondary | ICD-10-CM

## 2019-10-24 ENCOUNTER — Other Ambulatory Visit: Payer: Self-pay | Admitting: Family

## 2019-10-24 ENCOUNTER — Telehealth: Payer: Self-pay | Admitting: Internal Medicine

## 2019-10-24 MED ORDER — TRAZODONE HCL 50 MG PO TABS: 25.0000 mg | ORAL_TABLET | Freq: Every evening | ORAL | 1 refills | Status: DC | PRN

## 2019-10-24 NOTE — Telephone Encounter (Signed)
   Patient requesting RX for Trazadone due to trouble sleeping Please advise

## 2019-10-24 NOTE — Telephone Encounter (Signed)
I sent in the Trazodone for him; however, it looks like he told Dr. Alain Marion that this was not effective. If he is continuing with sleep problems, I would like him to come talk to Dr. Alain Marion about other options.

## 2019-10-24 NOTE — Telephone Encounter (Signed)
Spoke with patient today and info given. 

## 2019-11-10 ENCOUNTER — Telehealth: Payer: Self-pay | Admitting: Internal Medicine

## 2019-11-10 NOTE — Telephone Encounter (Signed)
New message:   Pt is calling and states he is having some increased hearing issues. He states he has been told by the [facility] that he needs a referral from Dr. Camila Li in order to go there for and appt and be referred to a audiology Dr. Please advise.

## 2019-11-11 NOTE — Telephone Encounter (Signed)
Patient showed up in office today to check the status. States that they need a letter also stating that it is okay for patient to get hearing aids. Please advise.Would like to call back once letter is ready for pick up.

## 2019-11-12 NOTE — Telephone Encounter (Signed)
[facility] [place] and they stated the patient did not need a referral from [place] to est care with them but will need to call and go through the eligibility process to be assigned a primary care there and pt was notified and would like to go through them for the audiology referral.

## 2019-11-25 DIAGNOSIS — L03011 Cellulitis of right finger: Secondary | ICD-10-CM | POA: Diagnosis not present

## 2020-03-09 ENCOUNTER — Other Ambulatory Visit: Payer: Self-pay

## 2020-03-09 ENCOUNTER — Ambulatory Visit (INDEPENDENT_AMBULATORY_CARE_PROVIDER_SITE_OTHER): Payer: Medicare Other

## 2020-03-09 VITALS — BP 116/80 | HR 55 | Temp 97.9°F | Resp 16 | Ht 66.0 in | Wt 128.8 lb

## 2020-03-09 DIAGNOSIS — Z0000 Encounter for general adult medical examination without abnormal findings: Secondary | ICD-10-CM | POA: Diagnosis not present

## 2020-03-09 NOTE — Progress Notes (Addendum)
Subjective:   [name] is a 86 y.o. male who presents for Medicare Annual/Subsequent preventive examination.  Review of Systems    No ROS. Medicare Wellness Visit Cardiac Risk Factors include: advanced age (>29mn, >>47women);family history of premature cardiovascular disease;hypertension;male gender     Objective:    Today's Vitals   03/09/20 1321  BP: 116/80  Pulse: (!) 55  Resp: 16  Temp: 97.9 F (36.6 C)  SpO2: 98%  Weight: 128 lb 12.8 oz (58.4 kg)  Height: _0  (1.676 m)  PainSc: 0-No pain   Body mass index is 20.79 kg/m.  Advanced Directives 03/09/2020  Does Patient Have a Medical Advance Directive? Yes  Type of Advance Directive Living will;Healthcare Power of Attorney  Does patient want to make changes to medical advance directive? No - Patient declined  Copy of [place] Chart? No - copy requested    Current Medications (verified) Outpatient Encounter Medications as of 03/09/2020  Medication Sig   amLODipine (NORVASC) 10 MG tablet TAKE ONE TABLET BY MOUTH DAILY   Ascorbic Acid (VITAMIN C PO) Take 1 capsule by mouth daily.   aspirin EC 81 MG tablet Take 1 tablet (81 mg total) by mouth daily.   B Complex-C (SUPER B COMPLEX PO) Take 1 tablet by mouth daily.   cholecalciferol (VITAMIN D) 1000 UNITS tablet Take 1,000 Units by mouth daily.   fish oil-omega-3 fatty acids 1000 MG capsule Take 1 g by mouth 2 (two) times daily.    lisinopril (ZESTRIL) 20 MG tablet Take 1 tablet (20 mg total) by mouth daily.   loperamide (IMODIUM A-D) 2 MG tablet Take 1 tablet (2 mg total) by mouth 4 (four) times daily as needed for diarrhea or loose stools.   Magnesium 400 MG TABS Take 400 mg by mouth daily.   metoprolol succinate (TOPROL-XL) 25 MG 24 hr tablet TAKE ONE TABLET BY MOUTH DAILY   multivitamin (THERAGRAN) tablet Take 1 tablet by mouth daily.     nitroGLYCERIN (NITROSTAT) 0.4 MG SL tablet Place 1 tablet (0.4 mg total) under the tongue every 5  (five) minutes as needed for chest pain (Max 3 doses within 15 minutes call 911).   saccharomyces boulardii (FLORASTOR) 250 MG capsule Take 1 capsule (250 mg total) by mouth 2 (two) times daily.   simvastatin (ZOCOR) 20 MG tablet TAKE ONE TABLET BY MOUTH AT BEDTIME   tamsulosin (FLOMAX) 0.4 MG CAPS capsule Take 2 capsules (0.8 mg total) by mouth daily.   traZODone (DESYREL) 50 MG tablet Take 0.5-1 tablets (25-50 mg total) by mouth at bedtime as needed for sleep.   No facility-administered encounter medications on file as of 03/09/2020.    Allergies (verified) Sulfonamide derivatives   History: Past Medical History:  Diagnosis Date   Aortic valve disorders    Echo 3/19: Mild LVH, EF 65-70, dynamic obstruction during Valsalva in mid cavity with mid cavity obliteration (peak velocity 200 cm/s, peak gradient 16 mmHg), normal wall motion, grade 1 diastolic dysfunction, AVR with mild stenosis (mean gradient 32, peak 56), MAC, mild to moderate MR, mild LAE   Atherosclerosis of renal artery (HCC)    Bilateral renal artery stenosis (HCC)    documented at the time of angiography by Dr. DAlbertine Patricia  BPH (benign prostatic hypertrophy)    Carotid artery disease ([place]    Carotid [place]: Bilateral ICA 40-59, follow-up 1 year   Cerebrovascular disease, unspecified    Coronary atherosclerosis of unspecified type  of vessel, native or graft    History of cystoscopy    w/ bladder biopsy   History of echocardiogram    Echo 3/19: mild LVH, EF 65-70, dynamic obstruction during Valsalva with mid cavity obliteration (peak velocity of 200 cm/sec - peak gradient 16 mmHg), no RWMA, Gr 1 DD, s/p AVR, mild AS (mean 32, peak 56), MAC, mild to mod MR, mild LAE   Malignant neoplasm of bladder, part unspecified    Melanoma in situ of back ([place]) 2012   area of left scapula   Old myocardial infarction    Other and unspecified hyperlipidemia    Peripheral vascular disease, unspecified ([place])    history of stent left lower  extremity placed by Dr. Albertine Patricia   Postsurgical aortocoronary bypass status    Postsurgical percutaneous transluminal coronary angioplasty status    Scarlet fever    Unspecified sleep apnea    Past Surgical History:  Procedure Laterality Date   CORONARY ARTERY BYPASS GRAFT     CYSTOSCOPY     w/ bladder biopsy   MELANOMA EXCISION  2012   left scapular  area - clean margins.    Percutaneous transluminal coronary angioplasty     redo median sternotomy     extracorporeal circulation, aortic valve replacement using a 23-mm Mitroflow aortic pericardial heart valve.  Surgeon- Gilford Raid, MD 04-18-10   Family History  Problem Relation Age of Onset   Hypertension Mother    Diabetes Mother    Hyperlipidemia Mother    Coronary artery disease Mother    Heart disease Mother        CAD, cardiomyopathy   Stroke Mother    Coronary artery disease Father    Sudden death Father        1 wk post MI   Heart attack Father    Heart disease Father        CAD/MI-sudden death   Cancer Other        prostate   Birth defects Neg Hx    Social History   Socioeconomic History   Marital status: Married    Spouse name: Not on file   Number of children: 3   Years of education: 66   Highest education level: Not on file  Occupational History   Occupation: Lobbyist: RETIRED   Occupation: retired  Tobacco Use   Smoking status: Never Smoker   Smokeless tobacco: Never Used  Scientific laboratory technician Use: Never used  Substance and Sexual Activity   Alcohol use: Yes    Comment: rare   Drug use: No   Sexual activity: Not Currently  Other Topics Concern   Not on file  Social History Narrative   HSG, [employer], did some graduate work. Married '52. 1 son - '61; 2 daughters -'62, '63: grandchildren 74; great-grands 62. 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 Active gardner, Designer, jewellery. good marriage.   Social Determinants of Health   Financial Resource Strain: Low Risk     Difficulty of Paying Living Expenses: Not hard at all  Food Insecurity: No Food Insecurity   Worried About Charity fundraiser in the Last Year: Never true   [place] in the Last Year: Never true  Transportation Needs: No Transportation Needs   Lack of Transportation (Medical): No   Lack of Transportation (Non-Medical): No  Physical Activity: Sufficiently Active   Days of Exercise per Week: 5 days   Minutes of Exercise per  Session: 30 min  Stress: No Stress Concern Present   Feeling of Stress : Not at all  Social Connections:    Frequency of Communication with Friends and Family: Not on file   Frequency of Social Gatherings with Friends and Family: Not on file   Attends Religious Services: Not on Electrical engineer or Organizations: Not on file   Attends Archivist Meetings: Not on file   Marital Status: Not on file    Tobacco Counseling Counseling given: Not Answered   Clinical Intake:  Pre-visit preparation completed: Yes  Pain : No/denies pain Pain Score: 0-No pain     BMI - recorded: 20.79 Nutritional Status: BMI of 19-24  Normal Nutritional Risks: None Diabetes: No  How often do you need to have someone help you when you read instructions, pamphlets, or other written materials from your doctor or pharmacy?: 1 - Never What is the last grade level you completed in school?: HSG; Veteran  Diabetic? no  Interpreter Needed?: No  Information entered by :: Chales Pelissier N. Aoife Bold, LPN   Activities of Daily Living In your present state of health, do you have any difficulty performing the following activities: 03/09/2020  Hearing? N  Vision? N  Difficulty concentrating or making decisions? N  Walking or climbing stairs? N  Dressing or bathing? N  Doing errands, shopping? N  Preparing Food and eating ? N  Using the Toilet? N  In the past six months, have you accidently leaked urine? N  Do you have problems with loss of bowel control? N    Managing your Medications? N  Managing your Finances? N  Housekeeping or managing your Housekeeping? N  Some recent data might be hidden    Patient Care Team: Plotnikov, Evie Lacks, MD as PCP - General (Internal Medicine) Sherren Mocha, MD as PCP - Cardiology (Cardiology) Gaye Pollack, MD (Cardiothoracic Surgery) Festus Aloe, MD as Consulting Physician (Urology)  Indicate any recent Medical Services you may have received from other than [facility] providers in the past year (date may be approximate).     Assessment:   This is a routine wellness examination for [place].  Hearing/Vision screen No exam data present  Dietary issues and exercise activities discussed: Current Exercise Habits: Home exercise routine, Type of exercise: walking;Other - see comments (gardening, yard work; owns 5 acres of land), Time (Minutes): 30, Frequency (Times/Week): 5, Weekly Exercise (Minutes/Week): 150, Intensity: Moderate, Exercise limited by: None identified  Goals   None    Depression Screen PHQ 2/9 Scores 03/09/2020 07/30/2018 07/27/2017 01/24/2016 08/24/2014  PHQ - 2 Score 0 0 0 0 0    Fall Risk Fall Risk  03/09/2020 03/09/2020 07/30/2018 07/27/2017 01/24/2016  Falls in the past year? - 0 0 No No  Number falls in past yr: - 0 - - -  Injury with Fall? - 0 - - -  Risk for fall due to : No Fall Risks No Fall Risks - - -  Follow up Falls evaluation completed;Education provided Falls evaluation completed;Education provided Falls evaluation completed - -    Any stairs in or around the home? Yes  If so, are there any without handrails? No  Home free of loose throw rugs in walkways, pet beds, electrical cords, etc? Yes  Adequate lighting in your home to reduce risk of falls? Yes   ASSISTIVE DEVICES UTILIZED TO PREVENT FALLS:  Life alert? No  Use of a cane, walker or w/c? No  Grab bars  in the bathroom? Yes  Shower chair or bench in shower? Yes  Elevated toilet seat or a handicapped toilet? No    TIMED UP AND GO:  Was the test performed? No .  Length of time to ambulate 10 feet: 0 sec.   Gait steady and fast without use of assistive device  Cognitive Function:     6CIT Screen 03/09/2020  What Year? 0 points  What month? 0 points  What time? 0 points  Count back from 20 0 points  Months in reverse 0 points  Repeat phrase 0 points  Total Score 0    Immunizations Immunization History  Administered Date(s) Administered   Fluad Quad(high Dose 65+) 04/21/2019   Influenza Split 05/31/2011, 05/09/2012   Influenza Whole 05/04/2008, 04/14/2009, 04/07/2010   Influenza, High Dose Seasonal PF 04/06/2016, 04/25/2017, 05/08/2018   Influenza,inj,Quad PF,6+ Mos 04/22/2013, 03/11/2014, 03/25/2015   PFIZER SARS-COV-2 Vaccination 08/09/2019, 08/30/2019   Pneumococcal Conjugate-13 03/11/2014   Pneumococcal Polysaccharide-23 05/12/2003, 07/21/2015   Td 02/08/2012   Zoster 07/21/2011    TDAP status: Up to date Flu Vaccine status: Up to date Pneumococcal vaccine status: Up to date Covid-19 vaccine status: Completed vaccines  Qualifies for Shingles Vaccine? Yes   Zostavax completed Yes   Shingrix Completed?: No.    Education has been provided regarding the importance of this vaccine. Patient has been advised to call insurance company to determine out of pocket expense if they have not yet received this vaccine. Advised may also receive vaccine at local pharmacy or Health Dept. Verbalized acceptance and understanding.  Screening Tests Health Maintenance  Topic Date Due   INFLUENZA VACCINE  02/15/2020   TETANUS/TDAP  02/07/2022   COVID-19 Vaccine  Completed   PNA vac Low Risk Adult  Completed    Health Maintenance  Health Maintenance Due  Topic Date Due   INFLUENZA VACCINE  02/15/2020    Colorectal cancer screening: No longer required.   Lung Cancer Screening: (Low Dose CT Chest recommended if Age 22-80 years, 30 pack-year currently smoking OR have quit w/in 15years.)  does not qualify.   Lung Cancer Screening Referral: no  Additional Screening:  Hepatitis C Screening: does not qualify; Completed no  Vision Screening: Recommended annual ophthalmology exams for early detection of glaucoma and other disorders of the eye. Is the patient up to date with their annual eye exam?  Yes  Who is the provider or what is the name of the office in which the patient attends annual eye exams? [facility] If pt is not established with a provider, would they like to be referred to a provider to establish care? No .   Dental Screening: Recommended annual dental exams for proper oral hygiene  Community Resource Referral / Chronic Care Management: CRR required this visit?  No   CCM required this visit?  No      Plan:     I have personally reviewed and noted the following in the patient's chart:   Medical and social history Use of alcohol, tobacco or illicit drugs  Current medications and supplements Functional ability and status Nutritional status Physical activity Advanced directives List of other physicians Hospitalizations, surgeries, and ER visits in previous 12 months Vitals Screenings to include cognitive, depression, and falls Referrals and appointments  In addition, I have reviewed and discussed with patient certain preventive protocols, quality metrics, and best practice recommendations. A written personalized care plan for preventive services as well as general preventive health recommendations were provided to patient.  Sheral Flow, LPN   3/71/0626   Nurse Notes: n/a  Medical screening examination/treatment/procedure(s) were performed by non-physician practitioner and as supervising physician I was immediately available for consultation/collaboration.  I agree with above. Lew Dawes, MD

## 2020-03-09 NOTE — Patient Instructions (Signed)
[name] , Thank you for taking time to come for your Medicare Wellness Visit. I appreciate your ongoing commitment to your health goals. Please review the following plan we discussed and let me know if I can assist you in the future.   Screening recommendations/referrals: Colonoscopy: never done Recommended yearly ophthalmology/optometry visit for glaucoma screening and checkup Recommended yearly dental visit for hygiene and checkup  Vaccinations: Influenza vaccine: 04/21/2019 Pneumococcal vaccine: completed Tdap vaccine: 02/08/2012; due every 10 years Shingles vaccine: never done; will [facility] with [facility]   Covid-19: completed  Advanced directives: Please bring a copy of your health care power of attorney and living will to the office at your convenience.  Conditions/risks identified: Yes; Please continue to do your personal lifestyle choices by: daily care of teeth and gums, regular physical activity (goal should be 5 days a week for 30 minutes), eat a healthy diet, avoid tobacco and drug use, limiting any alcohol intake, taking a low-dose aspirin (if not allergic or have been advised by your provider otherwise) and taking vitamins and minerals as recommended by your provider. Continue doing brain stimulating activities (puzzles, reading, adult coloring books, staying active) to keep memory sharp. Continue to eat heart healthy diet (full of fruits, vegetables, whole grains, lean protein, water--limit salt, fat, and sugar intake) and increase physical activity as tolerated.  Next appointment: Please schedule your next Medicare Wellness Visit with your Nurse Health Advisor in 1 year.  Preventive Care 84 Years and Older, Male Preventive care refers to lifestyle choices and visits with your health care provider that can promote health and wellness. What does preventive care include?  A yearly physical exam. This is also called an annual well check.  Dental exams once or twice a  year.  Routine eye exams. Ask your health care provider how often you should have your eyes checked.  Personal lifestyle choices, including:  Daily care of your teeth and gums.  Regular physical activity.  Eating a healthy diet.  Avoiding tobacco and drug use.  Limiting alcohol use.  Practicing safe sex.  Taking low doses of aspirin every day.  Taking vitamin and mineral supplements as recommended by your health care provider. What happens during an annual well check? The services and screenings done by your health care provider during your annual well check will depend on your age, overall health, lifestyle risk factors, and family history of disease. Counseling  Your health care provider may ask you questions about your:  Alcohol use.  Tobacco use.  Drug use.  Emotional well-being.  Home and relationship well-being.  Sexual activity.  Eating habits.  History of falls.  Memory and ability to understand (cognition).  Work and work Statistician. Screening  You may have the following tests or measurements:  Height, weight, and BMI.  Blood pressure.  Lipid and cholesterol levels. These may be checked every 5 years, or more frequently if you are over 27 years old.  Skin check.  Lung cancer screening. You may have this screening every year starting at age 84 if you have a 30-pack-year history of smoking and currently smoke or have quit within the past 15 years.  Fecal occult blood test (FOBT) of the stool. You may have this test every year starting at age 84.  Flexible sigmoidoscopy or colonoscopy. You may have a sigmoidoscopy every 5 years or a colonoscopy every 10 years starting at age 60.  Prostate cancer screening. Recommendations will vary depending on your family history and other risks.  Hepatitis  C blood test.  Hepatitis B blood test.  Sexually transmitted disease (STD) testing.  Diabetes screening. This is done by checking your blood sugar  (glucose) after you have not eaten for a while (fasting). You may have this done every 1-3 years.  Abdominal aortic aneurysm (AAA) screening. You may need this if you are a current or former smoker.  Osteoporosis. You may be screened starting at age 84 if you are at high risk. Talk with your health care provider about your test results, treatment options, and if necessary, the need for more tests. Vaccines  Your health care provider may recommend certain vaccines, such as:  Influenza vaccine. This is recommended every year.  Tetanus, diphtheria, and acellular pertussis (Tdap, Td) vaccine. You may need a Td booster every 10 years.  Zoster vaccine. You may need this after age 84.  Pneumococcal 13-valent conjugate (PCV13) vaccine. One dose is recommended after age 22.  Pneumococcal polysaccharide (PPSV23) vaccine. One dose is recommended after age 2. Talk to your health care provider about which screenings and vaccines you need and how often you need them. This information is not intended to replace advice given to you by your health care provider. Make sure you discuss any questions you have with your health care provider. Document Released: 07/30/2015 Document Revised: 03/22/2016 Document Reviewed: 05/04/2015 Elsevier Interactive Patient Education  2017 [place] Prevention in the Home Falls can cause injuries. They can happen to people of all ages. There are many things you can do to make your home safe and to help prevent falls. What can I do on the outside of my home?  Regularly fix the edges of walkways and driveways and fix any cracks.  Remove anything that might make you trip as you walk through a door, such as a raised step or threshold.  Trim any bushes or trees on the path to your home.  Use bright outdoor lighting.  Clear any walking paths of anything that might make someone trip, such as rocks or tools.  Regularly check to see if handrails are loose or  broken. Make sure that both sides of any steps have handrails.  Any raised decks and porches should have guardrails on the edges.  Have any leaves, snow, or ice cleared regularly.  Use sand or salt on walking paths during winter.  Clean up any spills in your garage right away. This includes oil or grease spills. What can I do in the bathroom?  Use night lights.  Install grab bars by the toilet and in the tub and shower. Do not use towel bars as grab bars.  Use non-skid mats or decals in the tub or shower.  If you need to sit down in the shower, use a plastic, non-slip stool.  Keep the floor dry. Clean up any water that spills on the floor as soon as it happens.  Remove soap buildup in the tub or shower regularly.  Attach bath mats securely with double-sided non-slip rug tape.  Do not have throw rugs and other things on the floor that can make you trip. What can I do in the bedroom?  Use night lights.  Make sure that you have a light by your bed that is easy to reach.  Do not use any sheets or blankets that are too big for your bed. They should not hang down onto the floor.  Have a firm chair that has side arms. You can use this for support while you get  dressed.  Do not have throw rugs and other things on the floor that can make you trip. What can I do in the kitchen?  Clean up any spills right away.  Avoid walking on wet floors.  Keep items that you use a lot in easy-to-reach places.  If you need to reach something above you, use a strong step stool that has a grab bar.  Keep electrical cords out of the way.  Do not use floor polish or wax that makes floors slippery. If you must use wax, use non-skid floor wax.  Do not have throw rugs and other things on the floor that can make you trip. What can I do with my stairs?  Do not leave any items on the stairs.  Make sure that there are handrails on both sides of the stairs and use them. Fix handrails that are  broken or loose. Make sure that handrails are as long as the stairways.  Check any carpeting to make sure that it is firmly attached to the stairs. Fix any carpet that is loose or worn.  Avoid having throw rugs at the top or bottom of the stairs. If you do have throw rugs, attach them to the floor with carpet tape.  Make sure that you have a light switch at the top of the stairs and the bottom of the stairs. If you do not have them, ask someone to add them for you. What else can I do to help prevent falls?  Wear shoes that:  Do not have high heels.  Have rubber bottoms.  Are comfortable and fit you well.  Are closed at the toe. Do not wear sandals.  If you use a stepladder:  Make sure that it is fully opened. Do not climb a closed stepladder.  Make sure that both sides of the stepladder are locked into place.  Ask someone to hold it for you, if possible.  Clearly mark and make sure that you can see:  Any grab bars or handrails.  First and last steps.  Where the edge of each step is.  Use tools that help you move around (mobility aids) if they are needed. These include:  Canes.  Walkers.  Scooters.  Crutches.  Turn on the lights when you go into a dark area. Replace any light bulbs as soon as they burn out.  Set up your furniture so you have a clear path. Avoid moving your furniture around.  If any of your floors are uneven, fix them.  If there are any pets around you, be aware of where they are.  Review your medicines with your doctor. Some medicines can make you feel dizzy. This can increase your chance of falling. Ask your doctor what other things that you can do to help prevent falls. This information is not intended to replace advice given to you by your health care provider. Make sure you discuss any questions you have with your health care provider. Document Released: 04/29/2009 Document Revised: 12/09/2015 Document Reviewed: 08/07/2014 Elsevier  Interactive Patient Education  2017 [employer].

## 2020-03-26 ENCOUNTER — Encounter: Payer: Self-pay | Admitting: Internal Medicine

## 2020-03-26 ENCOUNTER — Ambulatory Visit: Payer: Medicare Other | Admitting: Internal Medicine

## 2020-03-26 ENCOUNTER — Other Ambulatory Visit: Payer: Self-pay

## 2020-03-26 VITALS — BP 176/74 | HR 58 | Temp 98.5°F | Ht 66.0 in | Wt 128.0 lb

## 2020-03-26 DIAGNOSIS — I10 Essential (primary) hypertension: Secondary | ICD-10-CM | POA: Diagnosis not present

## 2020-03-26 DIAGNOSIS — E039 Hypothyroidism, unspecified: Secondary | ICD-10-CM | POA: Diagnosis not present

## 2020-03-26 DIAGNOSIS — R600 Localized edema: Secondary | ICD-10-CM | POA: Diagnosis not present

## 2020-03-26 DIAGNOSIS — R972 Elevated prostate specific antigen [PSA]: Secondary | ICD-10-CM

## 2020-03-26 NOTE — Progress Notes (Signed)
Subjective:  Patient ID: [name], male    DOB: 1928/11/21  Age: 86 y.o. MRN: [id number]  CC: No chief complaint on file.   HPI [name] presents for a well exam F/u on HTN, dyslipidemia, BPH   Outpatient Medications Prior to Visit  Medication Sig Dispense Refill  . amLODipine (NORVASC) 10 MG tablet TAKE ONE TABLET BY MOUTH DAILY 90 tablet 3  . Ascorbic Acid (VITAMIN C PO) Take 1 capsule by mouth daily.    Marland Kitchen aspirin EC 81 MG tablet Take 1 tablet (81 mg total) by mouth daily. 90 tablet 3  . B Complex-C (SUPER B COMPLEX PO) Take 1 tablet by mouth daily.    . cholecalciferol (VITAMIN D) 1000 UNITS tablet Take 1,000 Units by mouth daily.    . fish oil-omega-3 fatty acids 1000 MG capsule Take 1 g by mouth 2 (two) times daily.     Marland Kitchen lisinopril (ZESTRIL) 20 MG tablet Take 1 tablet (20 mg total) by mouth daily. 180 tablet 3  . loperamide (IMODIUM A-D) 2 MG tablet Take 1 tablet (2 mg total) by mouth 4 (four) times daily as needed for diarrhea or loose stools. 30 tablet 2  . Magnesium 400 MG TABS Take 400 mg by mouth daily.    . metoprolol succinate (TOPROL-XL) 25 MG 24 hr tablet TAKE ONE TABLET BY MOUTH DAILY 90 tablet 3  . multivitamin (THERAGRAN) tablet Take 1 tablet by mouth daily.      . nitroGLYCERIN (NITROSTAT) 0.4 MG SL tablet Place 1 tablet (0.4 mg total) under the tongue every 5 (five) minutes as needed for chest pain (Max 3 doses within 15 minutes call 911). 20 tablet 2  . saccharomyces boulardii (FLORASTOR) 250 MG capsule Take 1 capsule (250 mg total) by mouth 2 (two) times daily. 60 capsule 0  . simvastatin (ZOCOR) 20 MG tablet TAKE ONE TABLET BY MOUTH AT BEDTIME 90 tablet 3  . tamsulosin (FLOMAX) 0.4 MG CAPS capsule Take 2 capsules (0.8 mg total) by mouth daily. 180 capsule 3  . traZODone (DESYREL) 50 MG tablet Take 0.5-1 tablets (25-50 mg total) by mouth at bedtime as needed for sleep. 30 tablet 1   No facility-administered medications prior to visit.    ROS: Review  of Systems  Constitutional: Negative for appetite change, fatigue and unexpected weight change.  HENT: Positive for hearing loss. Negative for congestion, nosebleeds, sneezing, sore throat and trouble swallowing.   Eyes: Negative for itching and visual disturbance.  Respiratory: Negative for cough.   Cardiovascular: Negative for chest pain, palpitations and leg swelling.  Gastrointestinal: Negative for abdominal distention, blood in stool, diarrhea and nausea.  Genitourinary: Negative for frequency and hematuria.  Musculoskeletal: Negative for back pain, gait problem, joint swelling and neck pain.  Skin: Negative for rash.  Neurological: Negative for dizziness, tremors, speech difficulty and weakness.  Hematological: Bruises/bleeds easily.  Psychiatric/Behavioral: Negative for agitation, dysphoric mood and sleep disturbance. The patient is not nervous/anxious.     Objective:  BP (!) 176/74 (BP Location: Left Arm, Patient Position: Sitting, Cuff Size: Normal)   Pulse (!) 58   Temp 98.5 F (36.9 C) (Oral)   Ht 5\' 6"  (1.676 m)   Wt 128 lb (58.1 kg)   SpO2 98%   BMI 20.66 kg/m   BP Readings from Last 3 Encounters:  03/26/20 (!) 176/74  03/09/20 116/80  08/12/19 126/72    Wt Readings from Last 3 Encounters:  03/26/20 128 lb (58.1 kg)  03/09/20  128 lb 12.8 oz (58.4 kg)  08/12/19 132 lb (59.9 kg)    Physical Exam Constitutional:      General: He is not in acute distress.    Appearance: He is well-developed.     Comments: NAD  Eyes:     Conjunctiva/sclera: Conjunctivae normal.     Pupils: Pupils are equal, round, and reactive to light.  Neck:     Thyroid: No thyromegaly.     Vascular: No JVD.  Cardiovascular:     Rate and Rhythm: Normal rate and regular rhythm.     Heart sounds: Normal heart sounds. No murmur heard.  No friction rub. No gallop.   Pulmonary:     Effort: Pulmonary effort is normal. No respiratory distress.     Breath sounds: Normal breath sounds. No  wheezing or rales.  Chest:     Chest wall: No tenderness.  Abdominal:     General: Bowel sounds are normal. There is no distension.     Palpations: Abdomen is soft. There is no mass.     Tenderness: There is no abdominal tenderness. There is no guarding or rebound.  Genitourinary:    Prostate: Normal.     Rectum: Normal. Guaiac result negative.  Musculoskeletal:        General: No tenderness. Normal range of motion.     Cervical back: Normal range of motion.  Lymphadenopathy:     Cervical: No cervical adenopathy.  Skin:    General: Skin is warm and dry.     Findings: Bruising present. No rash.  Neurological:     Mental Status: He is alert and oriented to person, place, and time.     Cranial Nerves: No cranial nerve deficit.     Motor: No abnormal muscle tone.     Coordination: Coordination normal.     Gait: Gait normal.     Deep Tendon Reflexes: Reflexes are normal and symmetric.  Psychiatric:        Behavior: Behavior normal.        Thought Content: Thought content normal.        Judgment: Judgment normal.    LLE 1+ swelling, RLE trace NT  Lab Results  Component Value Date   WBC 6.7 08/12/2019   HGB 12.9 (L) 08/12/2019   HCT 38.9 (L) 08/12/2019   PLT 153.0 08/12/2019   GLUCOSE 96 08/12/2019   CHOL 172 08/12/2019   TRIG 128.0 08/12/2019   HDL 51.70 08/12/2019   LDLCALC 95 08/12/2019   ALT 14 08/12/2019   AST 21 08/12/2019   NA 141 08/12/2019   K 4.7 08/12/2019   CL 110 08/12/2019   CREATININE 1.48 08/12/2019   BUN 38 (H) 08/12/2019   CO2 25 08/12/2019   TSH 5.37 (H) 08/12/2019   PSA 2.08 08/12/2019   INR 1.49 04/18/2010   HGBA1C 5.7 03/11/2014    No results found.  Assessment & Plan:     Walker Kehr, MD

## 2020-03-26 NOTE — Assessment & Plan Note (Signed)
Compression socks D dimer

## 2020-03-26 NOTE — Assessment & Plan Note (Signed)
ASA, Amlodipine, Toprol, Lisinopril Reduce salt RTC 1 mo w/BP record

## 2020-03-26 NOTE — Patient Instructions (Addendum)
Compression socks 

## 2020-03-26 NOTE — Assessment & Plan Note (Signed)
L>>R D dimer May need to reduce Norvasc

## 2020-03-30 DIAGNOSIS — H524 Presbyopia: Secondary | ICD-10-CM | POA: Diagnosis not present

## 2020-03-30 DIAGNOSIS — H5703 Miosis: Secondary | ICD-10-CM | POA: Diagnosis not present

## 2020-03-30 DIAGNOSIS — H25813 Combined forms of age-related cataract, bilateral: Secondary | ICD-10-CM | POA: Diagnosis not present

## 2020-03-30 DIAGNOSIS — H2181 Floppy iris syndrome: Secondary | ICD-10-CM | POA: Diagnosis not present

## 2020-05-13 ENCOUNTER — Ambulatory Visit: Payer: Medicare Other | Admitting: Internal Medicine

## 2020-05-13 ENCOUNTER — Encounter: Payer: Self-pay | Admitting: Internal Medicine

## 2020-05-13 ENCOUNTER — Other Ambulatory Visit: Payer: Self-pay

## 2020-05-13 VITALS — BP 128/72 | HR 62 | Temp 98.2°F | Wt 131.8 lb

## 2020-05-13 DIAGNOSIS — I10 Essential (primary) hypertension: Secondary | ICD-10-CM | POA: Diagnosis not present

## 2020-05-13 DIAGNOSIS — Z23 Encounter for immunization: Secondary | ICD-10-CM | POA: Diagnosis not present

## 2020-05-13 DIAGNOSIS — I2510 Atherosclerotic heart disease of native coronary artery without angina pectoris: Secondary | ICD-10-CM | POA: Diagnosis not present

## 2020-05-13 DIAGNOSIS — H6122 Impacted cerumen, left ear: Secondary | ICD-10-CM

## 2020-05-13 NOTE — Assessment & Plan Note (Signed)
Amlodipine, Toprol, Lisinopril, ASA and simvastatin

## 2020-05-13 NOTE — Assessment & Plan Note (Signed)
See procedure 

## 2020-05-13 NOTE — Progress Notes (Signed)
Subjective:  Patient ID: [name], male    DOB: 11/06/1928  Age: 86 y.o. MRN: [id number]  CC: Follow-up (4 week F/U- Flu shot)   HPI [name] presents for wax in the L ear C/o memory issue F/u HTN, dyslipidemia  Outpatient Medications Prior to Visit  Medication Sig Dispense Refill  . amLODipine (NORVASC) 10 MG tablet TAKE ONE TABLET BY MOUTH DAILY 90 tablet 3  . Ascorbic Acid (VITAMIN C PO) Take 1 capsule by mouth daily.    Marland Kitchen aspirin EC 81 MG tablet Take 1 tablet (81 mg total) by mouth daily. 90 tablet 3  . B Complex-C (SUPER B COMPLEX PO) Take 1 tablet by mouth daily.    . cholecalciferol (VITAMIN D) 1000 UNITS tablet Take 1,000 Units by mouth daily.    . fish oil-omega-3 fatty acids 1000 MG capsule Take 1 g by mouth 2 (two) times daily.     Marland Kitchen lisinopril (ZESTRIL) 20 MG tablet Take 1 tablet (20 mg total) by mouth daily. 180 tablet 3  . loperamide (IMODIUM A-D) 2 MG tablet Take 1 tablet (2 mg total) by mouth 4 (four) times daily as needed for diarrhea or loose stools. 30 tablet 2  . Magnesium 400 MG TABS Take 400 mg by mouth daily.    . metoprolol succinate (TOPROL-XL) 25 MG 24 hr tablet TAKE ONE TABLET BY MOUTH DAILY 90 tablet 3  . multivitamin (THERAGRAN) tablet Take 1 tablet by mouth daily.      . nitroGLYCERIN (NITROSTAT) 0.4 MG SL tablet Place 1 tablet (0.4 mg total) under the tongue every 5 (five) minutes as needed for chest pain (Max 3 doses within 15 minutes call 911). 20 tablet 2  . simvastatin (ZOCOR) 20 MG tablet TAKE ONE TABLET BY MOUTH AT BEDTIME 90 tablet 3  . tamsulosin (FLOMAX) 0.4 MG CAPS capsule Take 2 capsules (0.8 mg total) by mouth daily. 180 capsule 3  . traZODone (DESYREL) 50 MG tablet Take 0.5-1 tablets (25-50 mg total) by mouth at bedtime as needed for sleep. 30 tablet 1  . saccharomyces boulardii (FLORASTOR) 250 MG capsule Take 1 capsule (250 mg total) by mouth 2 (two) times daily. (Patient not taking: Reported on 05/13/2020) 60 capsule 0   No  facility-administered medications prior to visit.    ROS: Review of Systems  Constitutional: Negative for appetite change, fatigue and unexpected weight change.  HENT: Positive for hearing loss. Negative for congestion, ear discharge, nosebleeds, sneezing, sore throat and trouble swallowing.   Eyes: Negative for itching and visual disturbance.  Respiratory: Negative for cough.   Cardiovascular: Negative for chest pain, palpitations and leg swelling.  Gastrointestinal: Negative for abdominal distention, blood in stool, diarrhea and nausea.  Genitourinary: Negative for frequency and hematuria.  Musculoskeletal: Negative for back pain, gait problem, joint swelling and neck pain.  Skin: Negative for rash.  Neurological: Negative for dizziness, tremors, speech difficulty and weakness.  Psychiatric/Behavioral: Positive for decreased concentration. Negative for agitation, dysphoric mood and sleep disturbance. The patient is not nervous/anxious.     Objective:  BP 128/72 (BP Location: Left Arm)   Pulse 62   Temp 98.2 F (36.8 C) (Oral)   Wt 131 lb 12.8 oz (59.8 kg)   SpO2 98%   BMI 21.27 kg/m   BP Readings from Last 3 Encounters:  05/13/20 128/72  03/26/20 (!) 176/74  03/09/20 116/80    Wt Readings from Last 3 Encounters:  05/13/20 131 lb 12.8 oz (59.8 kg)  03/26/20  128 lb (58.1 kg)  03/09/20 128 lb 12.8 oz (58.4 kg)    Physical Exam Constitutional:      General: He is not in acute distress.    Appearance: He is well-developed.     Comments: NAD  HENT:     Right Ear: Tympanic membrane and ear canal normal. There is no impacted cerumen.     Left Ear: Tympanic membrane and ear canal normal. There is impacted cerumen.  Eyes:     Conjunctiva/sclera: Conjunctivae normal.     Pupils: Pupils are equal, round, and reactive to light.  Neck:     Thyroid: No thyromegaly.     Vascular: No JVD.  Cardiovascular:     Rate and Rhythm: Normal rate and regular rhythm.     Heart sounds:  Normal heart sounds. No murmur heard.  No friction rub. No gallop.   Pulmonary:     Effort: Pulmonary effort is normal. No respiratory distress.     Breath sounds: Normal breath sounds. No wheezing or rales.  Chest:     Chest wall: No tenderness.  Abdominal:     General: Bowel sounds are normal. There is no distension.     Palpations: Abdomen is soft. There is no mass.     Tenderness: There is no abdominal tenderness. There is no guarding or rebound.  Musculoskeletal:        General: No tenderness. Normal range of motion.     Cervical back: Normal range of motion.  Lymphadenopathy:     Cervical: No cervical adenopathy.  Skin:    General: Skin is warm and dry.     Findings: No rash.  Neurological:     Mental Status: He is alert and oriented to person, place, and time.     Cranial Nerves: No cranial nerve deficit.     Motor: No abnormal muscle tone.     Coordination: Coordination normal.     Gait: Gait normal.     Deep Tendon Reflexes: Reflexes are normal and symmetric.  Psychiatric:        Behavior: Behavior normal.        Thought Content: Thought content normal.        Judgment: Judgment normal.     Procedure Note :     Procedure :  Ear irrigation left ear   Indication:  Cerumen impaction    Risks, including pain, dizziness, eardrum perforation, bleeding, infection and others as well as benefits were explained to the patient in detail. Verbal consent was obtained and the patient agreed to proceed.    We used "The Elephant Ear Irrigation Device" filled with lukewarm water for irrigation. A large amount wax was recovered from both ears. Procedure has also required manual wax removal/instrumentation with an ear wax curette and ear forceps on the right and left ears.   Tolerated well. Complications: None.   Postprocedure instructions :  Call if problems.   Lab Results  Component Value Date   WBC 6.7 08/12/2019   HGB 12.9 (L) 08/12/2019   HCT 38.9 (L) 08/12/2019   PLT  153.0 08/12/2019   GLUCOSE 96 08/12/2019   CHOL 172 08/12/2019   TRIG 128.0 08/12/2019   HDL 51.70 08/12/2019   LDLCALC 95 08/12/2019   ALT 14 08/12/2019   AST 21 08/12/2019   NA 141 08/12/2019   K 4.7 08/12/2019   CL 110 08/12/2019   CREATININE 1.48 08/12/2019   BUN 38 (H) 08/12/2019   CO2 25 08/12/2019   TSH  5.37 (H) 08/12/2019   PSA 2.08 08/12/2019   INR 1.49 04/18/2010   HGBA1C 5.7 03/11/2014    No results found.  Assessment & Plan:    Walker Kehr, MD

## 2020-05-13 NOTE — Assessment & Plan Note (Signed)
Amlodipine, Toprol, Lisinopril

## 2020-05-13 NOTE — Patient Instructions (Signed)
You can try [place] Mushroom extract or capsules for memory   

## 2020-07-12 ENCOUNTER — Other Ambulatory Visit: Payer: Self-pay | Admitting: Internal Medicine

## 2020-07-12 DIAGNOSIS — I10 Essential (primary) hypertension: Secondary | ICD-10-CM

## 2020-07-12 DIAGNOSIS — I359 Nonrheumatic aortic valve disorder, unspecified: Secondary | ICD-10-CM

## 2020-07-12 DIAGNOSIS — I25709 Atherosclerosis of coronary artery bypass graft(s), unspecified, with unspecified angina pectoris: Secondary | ICD-10-CM

## 2020-07-14 ENCOUNTER — Encounter: Payer: Self-pay | Admitting: Nurse Practitioner

## 2020-07-14 ENCOUNTER — Telehealth (INDEPENDENT_AMBULATORY_CARE_PROVIDER_SITE_OTHER): Payer: Medicare Other | Admitting: Nurse Practitioner

## 2020-07-14 VITALS — Temp 100.5°F | Ht 66.0 in | Wt 130.0 lb

## 2020-07-14 DIAGNOSIS — J069 Acute upper respiratory infection, unspecified: Secondary | ICD-10-CM | POA: Diagnosis not present

## 2020-07-14 DIAGNOSIS — Z20822 Contact with and (suspected) exposure to covid-19: Secondary | ICD-10-CM

## 2020-07-14 NOTE — Patient Instructions (Signed)
Maintain adequate oral hydration, small frequent meals and rest. Do not stay in bed all day. Use coricidin for cohest and nasal congestion, and tylenol for pain and fever. Call office if you change your mind about monoclonal infusion and if your symptoms worsen F/up in 5days (video appt)  10 Things You Can Do to Manage Your COVID-19 Symptoms at Home If you have possible or confirmed COVID-19: 1. Stay home from work and school. And stay away from other public places. If you must go out, avoid using any kind of public transportation, ridesharing, or taxis. 2. Monitor your symptoms carefully. If your symptoms get worse, call your healthcare provider immediately. 3. Get rest and stay hydrated. 4. If you have a medical appointment, call the healthcare provider ahead of time and tell them that you have or may have COVID-19. 5. For medical emergencies, call 911 and notify the dispatch personnel that you have or may have COVID-19. 6. Cover your cough and sneezes with a tissue or use the inside of your elbow. 7. Wash your hands often with soap and water for at least 20 seconds or clean your hands with an alcohol-based hand sanitizer that contains at least 60% alcohol. 8. As much as possible, stay in a specific room and away from other people in your home. Also, you should use a separate bathroom, if available. If you need to be around other people in or outside of the home, wear a mask. 9. Avoid sharing personal items with other people in your household, like dishes, towels, and bedding. 10. Clean all surfaces that are touched often, like counters, tabletops, and doorknobs. Use household cleaning sprays or wipes according to the label instructions. [email or web address] 01/15/2019 This information is not intended to replace advice given to you by your health care provider. Make sure you discuss any questions you have with your health care provider. Document Revised: 06/19/2019 Document Reviewed:  06/19/2019 Elsevier Patient Education  2020 [employer].

## 2020-07-14 NOTE — Progress Notes (Signed)
Virtual Visit via Video Note  I connected with@ on 07/14/20 at  2:00 PM EST by a video enabled telemedicine application and verified that I am speaking with the correct person using two identifiers.  Location: Patient:Home Provider: Office Participants: patient, Ms. Solorzano and provider  I discussed the limitations of evaluation and management by telemedicine and the availability of in person appointments. I also discussed with the patient that there may be a patient responsible charge related to this service. The patient expressed understanding and agreed to proceed.  CC:Pt c/o fever, cough, nasal drainage, headache, and congestion x2 days, Pt was exposed to COVID on Christmas Eve but has not been tested.  History of Present Illness: Direct exposure to confirmed COVID case on 07/09/2020. Onset of symptoms 07/11/2020: coughing, sneezing, fever (99-100) and nasal congestion. Denies any SOB, chest pain, palpitation, dizziness or fatigue. He is not interested in referral to monoclonal antibody infusion due to mild symptoms at this time.   Observations/Objective: Alert and oriented x4 No respiratory distress.  Assessment and Plan: Myquan was seen today for acute visit.  Diagnoses and all orders for this visit:  Close exposure to COVID-19 virus  Viral upper respiratory tract infection   Follow Up Instructions: Maintain adequate oral hydration, small frequent meals and rest. Do not stay in bed all day. Use coricidin for chest and nasal congestion, and tylenol for pain and fever. Call office if you change your mind about monoclonal infusion and if your symptoms worsen F/up in 5days (video appt)   I discussed the assessment and treatment plan with the patient. The patient was provided an opportunity to ask questions and all were answered. The patient agreed with the plan and demonstrated an understanding of the instructions.   The patient was advised to call back or seek an in-person  evaluation if the symptoms worsen or if the condition fails to improve as anticipated.  Alysia Penna, NP

## 2020-07-19 ENCOUNTER — Telehealth: Payer: Medicare Other | Admitting: Nurse Practitioner

## 2020-07-19 ENCOUNTER — Other Ambulatory Visit: Payer: Self-pay | Admitting: Internal Medicine

## 2020-07-19 DIAGNOSIS — I359 Nonrheumatic aortic valve disorder, unspecified: Secondary | ICD-10-CM

## 2020-07-19 DIAGNOSIS — I10 Essential (primary) hypertension: Secondary | ICD-10-CM

## 2020-07-19 DIAGNOSIS — I25709 Atherosclerosis of coronary artery bypass graft(s), unspecified, with unspecified angina pectoris: Secondary | ICD-10-CM

## 2020-08-08 ENCOUNTER — Other Ambulatory Visit: Payer: Self-pay | Admitting: Internal Medicine

## 2020-08-08 DIAGNOSIS — I359 Nonrheumatic aortic valve disorder, unspecified: Secondary | ICD-10-CM

## 2020-08-08 DIAGNOSIS — I10 Essential (primary) hypertension: Secondary | ICD-10-CM

## 2020-08-08 DIAGNOSIS — I25709 Atherosclerosis of coronary artery bypass graft(s), unspecified, with unspecified angina pectoris: Secondary | ICD-10-CM

## 2020-08-11 ENCOUNTER — Other Ambulatory Visit: Payer: Self-pay | Admitting: Internal Medicine

## 2020-08-11 DIAGNOSIS — I10 Essential (primary) hypertension: Secondary | ICD-10-CM

## 2020-08-11 DIAGNOSIS — I359 Nonrheumatic aortic valve disorder, unspecified: Secondary | ICD-10-CM

## 2020-08-11 DIAGNOSIS — I25709 Atherosclerosis of coronary artery bypass graft(s), unspecified, with unspecified angina pectoris: Secondary | ICD-10-CM

## 2020-08-12 ENCOUNTER — Other Ambulatory Visit: Payer: Self-pay | Admitting: Internal Medicine

## 2020-08-12 DIAGNOSIS — I359 Nonrheumatic aortic valve disorder, unspecified: Secondary | ICD-10-CM

## 2020-08-12 DIAGNOSIS — I10 Essential (primary) hypertension: Secondary | ICD-10-CM

## 2020-08-12 DIAGNOSIS — I25709 Atherosclerosis of coronary artery bypass graft(s), unspecified, with unspecified angina pectoris: Secondary | ICD-10-CM

## 2020-08-15 ENCOUNTER — Other Ambulatory Visit: Payer: Self-pay | Admitting: Internal Medicine

## 2020-08-15 DIAGNOSIS — I10 Essential (primary) hypertension: Secondary | ICD-10-CM

## 2020-08-15 DIAGNOSIS — I25709 Atherosclerosis of coronary artery bypass graft(s), unspecified, with unspecified angina pectoris: Secondary | ICD-10-CM

## 2020-08-15 DIAGNOSIS — I359 Nonrheumatic aortic valve disorder, unspecified: Secondary | ICD-10-CM

## 2020-08-24 ENCOUNTER — Other Ambulatory Visit: Payer: Self-pay

## 2020-08-25 ENCOUNTER — Ambulatory Visit: Payer: Medicare Other | Admitting: Internal Medicine

## 2020-08-25 ENCOUNTER — Encounter: Payer: Self-pay | Admitting: Internal Medicine

## 2020-08-25 DIAGNOSIS — I679 Cerebrovascular disease, unspecified: Secondary | ICD-10-CM | POA: Diagnosis not present

## 2020-08-25 DIAGNOSIS — I359 Nonrheumatic aortic valve disorder, unspecified: Secondary | ICD-10-CM | POA: Diagnosis not present

## 2020-08-25 DIAGNOSIS — I25709 Atherosclerosis of coronary artery bypass graft(s), unspecified, with unspecified angina pectoris: Secondary | ICD-10-CM | POA: Diagnosis not present

## 2020-08-25 DIAGNOSIS — I10 Essential (primary) hypertension: Secondary | ICD-10-CM | POA: Diagnosis not present

## 2020-08-25 MED ORDER — SIMVASTATIN 20 MG PO TABS: 20.0000 mg | ORAL_TABLET | Freq: Every day | ORAL | 3 refills | Status: AC

## 2020-08-25 MED ORDER — LISINOPRIL 20 MG PO TABS: 20.0000 mg | ORAL_TABLET | Freq: Every day | ORAL | 3 refills | Status: AC

## 2020-08-25 MED ORDER — TAMSULOSIN HCL 0.4 MG PO CAPS
0.8000 mg | ORAL_CAPSULE | Freq: Every day | ORAL | 3 refills | Status: AC
Start: 2020-08-25 — End: ?

## 2020-08-25 MED ORDER — TRAZODONE HCL 50 MG PO TABS: 25.0000 mg | ORAL_TABLET | Freq: Every evening | ORAL | 3 refills | Status: DC | PRN

## 2020-08-25 MED ORDER — AMLODIPINE BESYLATE 10 MG PO TABS: 10.0000 mg | ORAL_TABLET | Freq: Every day | ORAL | 3 refills | Status: DC

## 2020-08-25 MED ORDER — METOPROLOL SUCCINATE ER 25 MG PO TB24
25.0000 mg | ORAL_TABLET | Freq: Two times a day (BID) | ORAL | 3 refills | Status: AC
Start: 2020-08-25 — End: ?

## 2020-08-25 NOTE — Progress Notes (Signed)
Subjective:  Patient ID: [name], male    DOB: 1928-12-03  Age: 87 y.o. MRN: [id number]  CC: Hypertension (F/u on med- Need refill on Amlodipine)   HPI [name] presents for HTN  SBP at home 150  Outpatient Medications Prior to Visit  Medication Sig Dispense Refill  . amLODipine (NORVASC) 10 MG tablet Follow-up appt due in April must see provider for future refills 90 tablet 0  . Ascorbic Acid (VITAMIN C PO) Take 1 capsule by mouth daily.    Marland Kitchen aspirin EC 81 MG tablet Take 1 tablet (81 mg total) by mouth daily. 90 tablet 3  . B Complex-C (SUPER B COMPLEX PO) Take 1 tablet by mouth daily.    . cholecalciferol (VITAMIN D) 1000 UNITS tablet Take 1,000 Units by mouth daily.    . fish oil-omega-3 fatty acids 1000 MG capsule Take 1 g by mouth 2 (two) times daily.    Marland Kitchen lisinopril (ZESTRIL) 20 MG tablet Take 1 tablet (20 mg total) by mouth daily. Annual appt w/labs are due must see provider for future refills 30 tablet 0  . loperamide (IMODIUM A-D) 2 MG tablet Take 1 tablet (2 mg total) by mouth 4 (four) times daily as needed for diarrhea or loose stools. 30 tablet 2  . Magnesium 400 MG TABS Take 400 mg by mouth daily.    . metoprolol succinate (TOPROL-XL) 25 MG 24 hr tablet TAKE ONE TABLET BY MOUTH DAILY 90 tablet 3  . multivitamin (THERAGRAN) tablet Take 1 tablet by mouth daily.    . nitroGLYCERIN (NITROSTAT) 0.4 MG SL tablet Place 1 tablet (0.4 mg total) under the tongue every 5 (five) minutes as needed for chest pain (Max 3 doses within 15 minutes call 911). 20 tablet 2  . simvastatin (ZOCOR) 20 MG tablet TAKE ONE TABLET BY MOUTH EVERY NIGHT AT BEDTIME 52 tablet 3  . tamsulosin (FLOMAX) 0.4 MG CAPS capsule Take 2 capsules (0.8 mg total) by mouth daily. 180 capsule 3  . traZODone (DESYREL) 50 MG tablet Take 0.5-1 tablets (25-50 mg total) by mouth at bedtime as needed for sleep. 30 tablet 1   No facility-administered medications prior to visit.    ROS: Review of Systems   Constitutional: Negative for appetite change, fatigue and unexpected weight change.  HENT: Negative for congestion, nosebleeds, sneezing, sore throat and trouble swallowing.   Eyes: Negative for itching and visual disturbance.  Respiratory: Negative for cough.   Cardiovascular: Negative for chest pain, palpitations and leg swelling.  Gastrointestinal: Negative for abdominal distention, blood in stool, diarrhea and nausea.  Genitourinary: Positive for frequency. Negative for hematuria.  Musculoskeletal: Negative for back pain, gait problem, joint swelling and neck pain.  Skin: Negative for rash.  Neurological: Negative for dizziness, tremors, speech difficulty and weakness.  Psychiatric/Behavioral: Negative for agitation, dysphoric mood and sleep disturbance. The patient is not nervous/anxious.     Objective:  BP (!) 152/70 (BP Location: Left Arm)   Pulse 76   Temp 98.7 F (37.1 C) (Oral)   Ht 5\' 6"  (1.676 m)   Wt 129 lb 14.4 oz (58.9 kg)   SpO2 96%   BMI 20.97 kg/m   BP Readings from Last 3 Encounters:  08/25/20 (!) 152/70  05/13/20 128/72  03/26/20 (!) 176/74    Wt Readings from Last 3 Encounters:  08/25/20 129 lb 14.4 oz (58.9 kg)  07/14/20 130 lb (59 kg)  05/13/20 131 lb 12.8 oz (59.8 kg)    Physical Exam  Constitutional:      General: He is not in acute distress.    Appearance: He is well-developed.     Comments: NAD  HENT:     Mouth/Throat:     Mouth: Oropharynx is clear and moist.  Eyes:     Conjunctiva/sclera: Conjunctivae normal.     Pupils: Pupils are equal, round, and reactive to light.  Neck:     Thyroid: No thyromegaly.     Vascular: No JVD.  Cardiovascular:     Rate and Rhythm: Normal rate and regular rhythm.     Pulses: Intact distal pulses.     Heart sounds: Normal heart sounds. No murmur heard. No friction rub. No gallop.   Pulmonary:     Effort: Pulmonary effort is normal. No respiratory distress.     Breath sounds: Normal breath sounds. No  wheezing or rales.  Chest:     Chest wall: No tenderness.  Abdominal:     General: Bowel sounds are normal. There is no distension.     Palpations: Abdomen is soft. There is no mass.     Tenderness: There is no abdominal tenderness. There is no guarding or rebound.  Musculoskeletal:        General: No tenderness or edema. Normal range of motion.     Cervical back: Normal range of motion.  Lymphadenopathy:     Cervical: No cervical adenopathy.  Skin:    General: Skin is warm and dry.     Findings: No rash.  Neurological:     Mental Status: He is alert and oriented to person, place, and time.     Cranial Nerves: No cranial nerve deficit.     Motor: No abnormal muscle tone.     Coordination: He displays a negative Romberg sign. Coordination normal.     Gait: Gait normal.     Deep Tendon Reflexes: Reflexes are normal and symmetric.  Psychiatric:        Mood and Affect: Mood and affect normal.        Behavior: Behavior normal.        Thought Content: Thought content normal.        Judgment: Judgment normal.     Lab Results  Component Value Date   WBC 6.7 08/12/2019   HGB 12.9 (L) 08/12/2019   HCT 38.9 (L) 08/12/2019   PLT 153.0 08/12/2019   GLUCOSE 96 08/12/2019   CHOL 172 08/12/2019   TRIG 128.0 08/12/2019   HDL 51.70 08/12/2019   LDLCALC 95 08/12/2019   ALT 14 08/12/2019   AST 21 08/12/2019   NA 141 08/12/2019   K 4.7 08/12/2019   CL 110 08/12/2019   CREATININE 1.48 08/12/2019   BUN 38 (H) 08/12/2019   CO2 25 08/12/2019   TSH 5.37 (H) 08/12/2019   PSA 2.08 08/12/2019   INR 1.49 04/18/2010   HGBA1C 5.7 03/11/2014    No results found.  Assessment & Plan:     Walker Kehr, MD

## 2020-08-29 DIAGNOSIS — I359 Nonrheumatic aortic valve disorder, unspecified: Secondary | ICD-10-CM | POA: Insufficient documentation

## 2020-08-29 NOTE — Assessment & Plan Note (Signed)
Not well controlled.  Continue with  Amlodipine, Toprol, Lisinopril.  Increase Toprol to twice a day

## 2020-08-29 NOTE — Assessment & Plan Note (Signed)
Continue with aspirin, Zocor, blood pressure control

## 2020-09-21 ENCOUNTER — Telehealth: Payer: Self-pay | Admitting: Internal Medicine

## 2020-09-21 NOTE — Telephone Encounter (Signed)
He needs to see one of [place] first, unless he is an established pt w/[facility] Thx

## 2020-09-21 NOTE — Telephone Encounter (Signed)
Patient is requesting a referral to [facility] for increased back pain. Last OV 2.9.22. Please advise   Phone: [phone] Fax: [phone]

## 2020-09-22 ENCOUNTER — Other Ambulatory Visit: Payer: Self-pay

## 2020-09-22 NOTE — Telephone Encounter (Signed)
Patient has an appointment with Dr. Sharlet Salina on 3.10.22.

## 2020-09-23 ENCOUNTER — Ambulatory Visit: Payer: Medicare Other | Admitting: Internal Medicine

## 2020-09-23 ENCOUNTER — Encounter: Payer: Self-pay | Admitting: Internal Medicine

## 2020-09-23 ENCOUNTER — Ambulatory Visit (INDEPENDENT_AMBULATORY_CARE_PROVIDER_SITE_OTHER): Payer: Medicare Other

## 2020-09-23 ENCOUNTER — Other Ambulatory Visit: Payer: Self-pay

## 2020-09-23 VITALS — BP 122/64 | HR 58 | Temp 97.8°F | Resp 18 | Ht 66.0 in | Wt 129.0 lb

## 2020-09-23 DIAGNOSIS — G8929 Other chronic pain: Secondary | ICD-10-CM | POA: Diagnosis not present

## 2020-09-23 DIAGNOSIS — M545 Low back pain, unspecified: Secondary | ICD-10-CM | POA: Diagnosis not present

## 2020-09-23 DIAGNOSIS — M5441 Lumbago with sciatica, right side: Secondary | ICD-10-CM

## 2020-09-23 NOTE — Patient Instructions (Addendum)
We will get the x-ray and get you in with the back specialist.  It is okay to take tylenol for the pain in the back as well.

## 2020-09-23 NOTE — Progress Notes (Signed)
° °  Subjective:   Patient ID: [name], male    DOB: [birth year], 87 y.o.   MRN: [id number]  HPI The patient is an 87 YO man coming in for chronic back pain. Started years or more ago and worse in the last several months. It is getting more consistent. Worse with standing for a long time or moving around. He is taking tylenol otc rarely and this does not help but he only took 1 pill one time. No prior evaluation for same. Talked to people he knows and would like evaluation at [facility]. Denies numbness or weakness in his legs.   Review of Systems  Constitutional: Positive for activity change. Negative for appetite change, fatigue, fever and unexpected weight change.  Respiratory: Negative.   Cardiovascular: Negative.   Musculoskeletal: Positive for back pain and myalgias. Negative for arthralgias.  Skin: Negative.   Neurological: Negative for syncope, weakness and numbness.    Objective:  Physical Exam Constitutional:      Appearance: He is well-developed.  HENT:     Head: Normocephalic and atraumatic.  Cardiovascular:     Rate and Rhythm: Normal rate and regular rhythm.  Pulmonary:     Effort: Pulmonary effort is normal. No respiratory distress.     Breath sounds: Normal breath sounds. No wheezing or rales.  Abdominal:     General: Bowel sounds are normal. There is no distension.     Palpations: Abdomen is soft.     Tenderness: There is no abdominal tenderness. There is no rebound.  Musculoskeletal:        General: Tenderness present.     Cervical back: Normal range of motion.     Comments: Tenderness lumbar region  Skin:    General: Skin is warm and dry.  Neurological:     Mental Status: He is alert and oriented to person, place, and time.     Coordination: Coordination normal.     Vitals:   09/23/20 1347  BP: 122/64  Pulse: (!) 58  Resp: 18  Temp: 97.8 F (36.6 C)  TempSrc: Oral  SpO2: 98%  Weight: 129 lb (58.5 kg)  Height: 5\' 6"  (1.676 m)     This visit occurred during the SARS-CoV-2 public health emergency.  Safety protocols were in place, including screening questions prior to the visit, additional usage of staff PPE, and extensive cleaning of exam room while observing appropriate contact time as indicated for disinfecting solutions.   Assessment & Plan:

## 2020-09-24 DIAGNOSIS — G8929 Other chronic pain: Secondary | ICD-10-CM | POA: Insufficient documentation

## 2020-09-24 NOTE — Assessment & Plan Note (Signed)
Ordered x-ray lumbar and referral to neurosurgery. Advised to try max dosing tylenol 1000 mg TID to see if this helps with pain in the meantime.

## 2020-09-28 NOTE — Telephone Encounter (Signed)
   Patient calling, he attempted to schedule with [facility], they state they need referral and last imaging report

## 2020-09-28 NOTE — Telephone Encounter (Signed)
Referral was sent on 3/11. [facility] and left vm for them to contact pt. Pt is aware

## 2020-09-30 DIAGNOSIS — M5416 Radiculopathy, lumbar region: Secondary | ICD-10-CM | POA: Diagnosis not present

## 2020-09-30 DIAGNOSIS — G2581 Restless legs syndrome: Secondary | ICD-10-CM | POA: Insufficient documentation

## 2020-09-30 DIAGNOSIS — M5136 Other intervertebral disc degeneration, lumbar region: Secondary | ICD-10-CM | POA: Diagnosis not present

## 2020-09-30 DIAGNOSIS — M51369 Other intervertebral disc degeneration, lumbar region without mention of lumbar back pain or lower extremity pain: Secondary | ICD-10-CM | POA: Insufficient documentation

## 2020-10-18 DIAGNOSIS — M5416 Radiculopathy, lumbar region: Secondary | ICD-10-CM | POA: Diagnosis not present

## 2020-11-09 DIAGNOSIS — M5416 Radiculopathy, lumbar region: Secondary | ICD-10-CM | POA: Diagnosis not present

## 2020-11-09 DIAGNOSIS — M5136 Other intervertebral disc degeneration, lumbar region: Secondary | ICD-10-CM | POA: Diagnosis not present

## 2020-12-09 ENCOUNTER — Other Ambulatory Visit: Payer: Self-pay

## 2020-12-09 ENCOUNTER — Encounter: Payer: Self-pay | Admitting: Internal Medicine

## 2020-12-09 ENCOUNTER — Ambulatory Visit: Payer: Medicare Other | Admitting: Internal Medicine

## 2020-12-09 VITALS — BP 152/68 | HR 59 | Temp 98.2°F | Ht 66.0 in | Wt 127.0 lb

## 2020-12-09 DIAGNOSIS — H6120 Impacted cerumen, unspecified ear: Secondary | ICD-10-CM | POA: Diagnosis not present

## 2020-12-09 DIAGNOSIS — J309 Allergic rhinitis, unspecified: Secondary | ICD-10-CM

## 2020-12-09 DIAGNOSIS — H833X3 Noise effects on inner ear, bilateral: Secondary | ICD-10-CM | POA: Diagnosis not present

## 2020-12-09 DIAGNOSIS — I10 Essential (primary) hypertension: Secondary | ICD-10-CM | POA: Diagnosis not present

## 2020-12-09 NOTE — Patient Instructions (Signed)
You do not have wax buildup at all today  You appear to have allergy congestion which has affected the inner ears on both sides and affected your hearing  Please take all new medication as recommended - the Allegra and Nasacort OTC at least during the spring and fall seasons  Please continue all other medications as before, and refills have been done if requested.  Please have the pharmacy call with any other refills you may need.  Please keep your appointments with your specialists as you may have planned

## 2020-12-09 NOTE — Progress Notes (Signed)
Patient ID: [name], male   DOB: 05-21-1929, 87 y.o.   MRN: [id number]        Chief Complaint: follow up worsening hearing loss, allergies       HPI:  [name] is a 87 y.o. male here with c/o mild acute on chronic hearing loss in the past 2 wks, wondering about wax buildup again.  Does have several wks ongoing nasal allergy symptoms with clearish congestion, itch and sneezing, without fever, pain, ST, cough, swelling or wheezing.  Pt denies chest pain, increased sob or doe, wheezing, orthopnea, PND, increased LE swelling, palpitations, dizziness or syncope.   Pt denies polydipsia, polyuria, or new focal neuro s/s.   Pt denies fever, wt loss, night sweats, loss of appetite, or other constitutional symptoms  No other new complaints  BP at home has been < 140/90       Wt Readings from Last 3 Encounters:  12/09/20 127 lb (57.6 kg)  09/23/20 129 lb (58.5 kg)  08/25/20 129 lb 14.4 oz (58.9 kg)   BP Readings from Last 3 Encounters:  12/09/20 (!) 152/68  09/23/20 122/64  08/25/20 (!) 152/70         Past Medical History:  Diagnosis Date  . Aortic valve disorders    Echo 3/19: Mild LVH, EF 65-70, dynamic obstruction during Valsalva in mid cavity with mid cavity obliteration (peak velocity 200 cm/s, peak gradient 16 mmHg), normal wall motion, grade 1 diastolic dysfunction, AVR with mild stenosis (mean gradient 32, peak 56), MAC, mild to moderate MR, mild LAE  . Atherosclerosis of renal artery ([place])   . Bilateral renal artery stenosis (HCC)    documented at the time of angiography by Dr. Albertine Patricia  . BPH (benign prostatic hypertrophy)   . Carotid artery disease ([place])    Carotid US 3/19: Bilateral ICA 40-59, follow-up 1 year  . Cerebrovascular disease, unspecified   . Coronary atherosclerosis of unspecified type of vessel, native or graft   . History of cystoscopy    w/ bladder biopsy  . History of echocardiogram    Echo 3/19: mild LVH, EF 65-70, dynamic obstruction during Valsalva with  mid cavity obliteration (peak velocity of 200 cm/sec - peak gradient 16 mmHg), no RWMA, Gr 1 DD, s/p AVR, mild AS (mean 32, peak 56), MAC, mild to mod MR, mild LAE  . Malignant neoplasm of bladder, part unspecified   . Melanoma in situ of back ([place]) 2012   area of left scapula  . Old myocardial infarction   . Other and unspecified hyperlipidemia   . Peripheral vascular disease, unspecified ([place])    history of stent left lower extremity placed by Dr. Albertine Patricia  . Postsurgical aortocoronary bypass status   . Postsurgical percutaneous transluminal coronary angioplasty status   . Scarlet fever   . Unspecified sleep apnea    Past Surgical History:  Procedure Laterality Date  . CORONARY ARTERY BYPASS GRAFT    . CYSTOSCOPY     w/ bladder biopsy  . MELANOMA EXCISION  2012   left scapular  area - clean margins.   . Percutaneous transluminal coronary angioplasty    . redo median sternotomy     extracorporeal circulation, aortic valve replacement using a 23-mm Mitroflow aortic pericardial heart valve.  Surgeon- Gilford Raid, MD 04-18-10    reports that he has never smoked. He has never used smokeless tobacco. He reports current alcohol use. He reports that he does not use drugs. family history includes Cancer in  an other family member; Coronary artery disease in his father and mother; Diabetes in his mother; Heart attack in his father; Heart disease in his father and mother; Hyperlipidemia in his mother; Hypertension in his mother; Stroke in his mother; Sudden death in his father. Allergies  Allergen Reactions  . Sulfonamide Derivatives Hives  . Lactose   . Sulfa Antibiotics Hives   Current Outpatient Medications on File Prior to Visit  Medication Sig Dispense Refill  . amLODipine (NORVASC) 10 MG tablet Take 1 tablet (10 mg total) by mouth daily. 90 tablet 3  . Ascorbic Acid (VITAMIN C PO) Take 1 capsule by mouth daily.    Marland Kitchen aspirin EC 81 MG tablet Take 1 tablet (81 mg total) by mouth daily. 90  tablet 3  . B Complex-C (SUPER B COMPLEX PO) Take 1 tablet by mouth daily.    . cholecalciferol (VITAMIN D) 1000 UNITS tablet Take 1,000 Units by mouth daily.    . fish oil-omega-3 fatty acids 1000 MG capsule Take 1 g by mouth 2 (two) times daily.    Marland Kitchen lisinopril (ZESTRIL) 20 MG tablet Take 1 tablet (20 mg total) by mouth daily. 90 tablet 3  . loperamide (IMODIUM A-D) 2 MG tablet Take 1 tablet (2 mg total) by mouth 4 (four) times daily as needed for diarrhea or loose stools. 30 tablet 2  . Magnesium 400 MG TABS Take 400 mg by mouth daily.    . metoprolol succinate (TOPROL-XL) 25 MG 24 hr tablet Take 1 tablet (25 mg total) by mouth in the morning and at bedtime. 180 tablet 3  . multivitamin (THERAGRAN) tablet Take 1 tablet by mouth daily.    . nitroGLYCERIN (NITROSTAT) 0.4 MG SL tablet Place 1 tablet (0.4 mg total) under the tongue every 5 (five) minutes as needed for chest pain (Max 3 doses within 15 minutes call 911). 20 tablet 2  . simvastatin (ZOCOR) 20 MG tablet Take 1 tablet (20 mg total) by mouth at bedtime. 90 tablet 3  . tamsulosin (FLOMAX) 0.4 MG CAPS capsule Take 2 capsules (0.8 mg total) by mouth daily. 180 capsule 3  . traZODone (DESYREL) 50 MG tablet Take 0.5-1 tablets (25-50 mg total) by mouth at bedtime as needed for sleep. 90 tablet 3   No current facility-administered medications on file prior to visit.        ROS:  All others reviewed and negative.  Objective        PE:  BP (!) 152/68 (BP Location: Right Arm, Patient Position: Sitting, Cuff Size: Normal)   Pulse (!) 59   Temp 98.2 F (36.8 C) (Oral)   Ht _0  (1.676 m)   Wt 127 lb (57.6 kg)   SpO2 99%   BMI 20.50 kg/m                 Constitutional: Pt appears in NAD               HENT: Head: NCAT.                Right Ear: External ear normal.                 Left Ear: External ear normal. Bilat tm's with mild erythema.  Max sinus areas non tender.  Pharynx with mild erythema, no exudate               Eyes: .  Pupils are equal, round, and reactive to light. Conjunctivae and EOM are normal;  Bilat tm's with mild erythema.  Max sinus areas non tender.  Pharynx with mild erythema, no exudate               Nose: without d/c or deformity               Neck: Neck supple. Gross normal ROM               Cardiovascular: Normal rate and regular rhythm.                 Pulmonary/Chest: Effort normal and breath sounds without rales or wheezing.                Abd:  Soft, NT, ND, + BS, no organomegaly               Neurological: Pt is alert. At baseline orientation, motor grossly intact               Skin: Skin is warm. No rashes, no other new lesions, LE edema - none               Psychiatric: Pt behavior is normal without agitation   Micro: none  Cardiac tracings I have personally interpreted today:  none  Pertinent Radiological findings (summarize): none   Lab Results  Component Value Date   WBC 6.7 08/12/2019   HGB 12.9 (L) 08/12/2019   HCT 38.9 (L) 08/12/2019   PLT 153.0 08/12/2019   GLUCOSE 96 08/12/2019   CHOL 172 08/12/2019   TRIG 128.0 08/12/2019   HDL 51.70 08/12/2019   LDLCALC 95 08/12/2019   ALT 14 08/12/2019   AST 21 08/12/2019   NA 141 08/12/2019   K 4.7 08/12/2019   CL 110 08/12/2019   CREATININE 1.48 08/12/2019   BUN 38 (H) 08/12/2019   CO2 25 08/12/2019   TSH 5.37 (H) 08/12/2019   PSA 2.08 08/12/2019   INR 1.49 04/18/2010   HGBA1C 5.7 03/11/2014   Assessment/Plan:  DIOGO ANNE is a 87 y.o. White or Caucasian [1] male with  has a past medical history of Aortic valve disorders, Atherosclerosis of renal artery (HCC), Bilateral renal artery stenosis (HCC), BPH (benign prostatic hypertrophy), Carotid artery disease ([place]), Cerebrovascular disease, unspecified, Coronary atherosclerosis of unspecified type of vessel, native or graft, History of cystoscopy, History of echocardiogram, Malignant neoplasm of bladder, part unspecified, Melanoma in situ of back ([place]) (2012), Old myocardial  infarction, Other and unspecified hyperlipidemia, Peripheral vascular disease, unspecified ([place]), Postsurgical aortocoronary bypass status, Postsurgical percutaneous transluminal coronary angioplasty status, Scarlet fever, and Unspecified sleep apnea.  Cerumen impaction None today, pt and wife reassured  Hearing loss d/t noise With mild acute on chronic likely related to allergies and estachian tube dysfxn - for mucine prn  Essential hypertension Mild elevated today, declines med change, cont to monitor at home and next visit  Allergic rhinitis Mild to mod, for allegra and nasacort asd,  to f/u any worsening symptoms or concerns  Followup: Return if symptoms worsen or fail to improve.  Cathlean Cower, MD 12/12/2020 3:36 PM [facility]

## 2020-12-12 ENCOUNTER — Encounter: Payer: Self-pay | Admitting: Internal Medicine

## 2020-12-12 DIAGNOSIS — J309 Allergic rhinitis, unspecified: Secondary | ICD-10-CM | POA: Insufficient documentation

## 2020-12-12 NOTE — Assessment & Plan Note (Signed)
With mild acute on chronic likely related to allergies and estachian tube dysfxn - for mucine prn

## 2020-12-12 NOTE — Assessment & Plan Note (Signed)
None today, pt and wife reassured

## 2020-12-12 NOTE — Assessment & Plan Note (Signed)
Mild to mod, for allegra and nasacort asd,   to f/u any worsening symptoms or concerns 

## 2020-12-12 NOTE — Assessment & Plan Note (Signed)
Mild elevated today, declines med change, cont to monitor at home and next visit

## 2020-12-16 DIAGNOSIS — M5416 Radiculopathy, lumbar region: Secondary | ICD-10-CM | POA: Diagnosis not present

## 2020-12-23 DIAGNOSIS — R350 Frequency of micturition: Secondary | ICD-10-CM | POA: Diagnosis not present

## 2020-12-27 ENCOUNTER — Other Ambulatory Visit: Payer: Self-pay

## 2020-12-27 ENCOUNTER — Ambulatory Visit: Payer: Medicare Other | Admitting: Internal Medicine

## 2020-12-27 ENCOUNTER — Encounter: Payer: Self-pay | Admitting: Internal Medicine

## 2020-12-27 VITALS — BP 150/78 | HR 72 | Temp 98.5°F | Ht 66.0 in | Wt 128.0 lb

## 2020-12-27 DIAGNOSIS — I10 Essential (primary) hypertension: Secondary | ICD-10-CM | POA: Diagnosis not present

## 2020-12-27 DIAGNOSIS — E039 Hypothyroidism, unspecified: Secondary | ICD-10-CM | POA: Diagnosis not present

## 2020-12-27 DIAGNOSIS — R350 Frequency of micturition: Secondary | ICD-10-CM

## 2020-12-27 DIAGNOSIS — H9313 Tinnitus, bilateral: Secondary | ICD-10-CM | POA: Insufficient documentation

## 2020-12-27 DIAGNOSIS — H903 Sensorineural hearing loss, bilateral: Secondary | ICD-10-CM | POA: Insufficient documentation

## 2020-12-27 DIAGNOSIS — N3281 Overactive bladder: Secondary | ICD-10-CM | POA: Diagnosis not present

## 2020-12-27 DIAGNOSIS — Z461 Encounter for fitting and adjustment of hearing aid: Secondary | ICD-10-CM | POA: Insufficient documentation

## 2020-12-27 DIAGNOSIS — N401 Enlarged prostate with lower urinary tract symptoms: Secondary | ICD-10-CM

## 2020-12-27 DIAGNOSIS — Z8551 Personal history of malignant neoplasm of bladder: Secondary | ICD-10-CM | POA: Insufficient documentation

## 2020-12-27 NOTE — Progress Notes (Signed)
Patient ID: [name], male   DOB: [birth year], 87 y.o.   MRN: [id number]        Chief Complaint: follow up urgent care visit       HPI:  [name] is a 87 y.o. male here with c/o urinary urgency for many years but then changed to involve a sense of urinary retention on thur June 9, after he admits he had stopped his flomax 2 wks prior as he became concerned he did not understand why he was taking the medication   Denies urinary symptoms such as dysuria, frequency, flank pain, hematuria or n/v, fever, chills.  Was seen at [facility] soon after onset symptoms, and per pt UA was determined neg, and flomax restarted.  Symptoms of retention much improved in the last 2 days but urinary urgency and frequency still high and several times nocturia nightly make it difficult to sleep, and has to use cane to get to BR.  Pt denies chest pain, increased sob or doe, wheezing, orthopnea, PND, increased LE swelling, palpitations, dizziness or syncope.  BP at home has been well controlled per wife at < 140'90, declines med change for now.   Pt denies fever, wt loss, night sweats, loss of appetite, or other constitutional symptoms   Pt denies polydipsia, polyuria, or new focal neuro s/s.  Denies hyper or hypo thyroid symptoms such as voice, skin or hair change.       Wt Readings from Last 3 Encounters:  12/27/20 128 lb (58.1 kg)  12/09/20 127 lb (57.6 kg)  09/23/20 129 lb (58.5 kg)   BP Readings from Last 3 Encounters:  12/27/20 (!) 150/78  12/09/20 (!) 152/68  09/23/20 122/64         Past Medical History:  Diagnosis Date   Aortic valve disorders    Echo 3/19: Mild LVH, EF 65-70, dynamic obstruction during Valsalva in mid cavity with mid cavity obliteration (peak velocity 200 cm/s, peak gradient 16 mmHg), normal wall motion, grade 1 diastolic dysfunction, AVR with mild stenosis (mean gradient 32, peak 56), MAC, mild to moderate MR, mild LAE   Atherosclerosis of renal artery (HCC)    Bilateral renal artery  stenosis (HCC)    documented at the time of angiography by Dr. Albertine Patricia   BPH (benign prostatic hypertrophy)    Carotid artery disease ([place])    Carotid US 3/19: Bilateral ICA 40-59, follow-up 1 year   Cerebrovascular disease, unspecified    Coronary atherosclerosis of unspecified type of vessel, native or graft    History of cystoscopy    w/ bladder biopsy   History of echocardiogram    Echo 3/19: mild LVH, EF 65-70, dynamic obstruction during Valsalva with mid cavity obliteration (peak velocity of 200 cm/sec - peak gradient 16 mmHg), no RWMA, Gr 1 DD, s/p AVR, mild AS (mean 32, peak 56), MAC, mild to mod MR, mild LAE   Malignant neoplasm of bladder, part unspecified    Melanoma in situ of back ([place]) 2012   area of left scapula   Old myocardial infarction    Other and unspecified hyperlipidemia    Peripheral vascular disease, unspecified ([place])    history of stent left lower extremity placed by Dr. Albertine Patricia   Postsurgical aortocoronary bypass status    Postsurgical percutaneous transluminal coronary angioplasty status    Scarlet fever    Unspecified sleep apnea    Past Surgical History:  Procedure Laterality Date   CORONARY ARTERY BYPASS GRAFT  CYSTOSCOPY     w/ bladder biopsy   MELANOMA EXCISION  2012   left scapular  area - clean margins.    Percutaneous transluminal coronary angioplasty     redo median sternotomy     extracorporeal circulation, aortic valve replacement using a 23-mm Mitroflow aortic pericardial heart valve.  Surgeon- Gilford Raid, MD 04-18-10    reports that he has never smoked. He has never used smokeless tobacco. He reports current alcohol use. He reports that he does not use drugs. family history includes Cancer in an other family member; Coronary artery disease in his father and mother; Diabetes in his mother; Heart attack in his father; Heart disease in his father and mother; Hyperlipidemia in his mother; Hypertension in his mother; Stroke in his mother;  Sudden death in his father. Allergies  Allergen Reactions   Sulfonamide Derivatives Hives   Lactose Diarrhea   Sulfa Antibiotics Hives   Current Outpatient Medications on File Prior to Visit  Medication Sig Dispense Refill   amLODipine (NORVASC) 10 MG tablet Take 1 tablet (10 mg total) by mouth daily. 90 tablet 3   Ascorbic Acid (VITAMIN C PO) Take 1 capsule by mouth daily.     aspirin EC 81 MG tablet Take 1 tablet (81 mg total) by mouth daily. 90 tablet 3   B Complex-C (SUPER B COMPLEX PO) Take 1 tablet by mouth daily.     cholecalciferol (VITAMIN D) 1000 UNITS tablet Take 1,000 Units by mouth daily.     fish oil-omega-3 fatty acids 1000 MG capsule Take 1 g by mouth 2 (two) times daily.     lisinopril (ZESTRIL) 20 MG tablet Take 1 tablet (20 mg total) by mouth daily. 90 tablet 3   lisinopril (ZESTRIL) 20 MG tablet Take 1 tablet by mouth daily.     loperamide (IMODIUM A-D) 2 MG tablet Take 1 tablet (2 mg total) by mouth 4 (four) times daily as needed for diarrhea or loose stools. 30 tablet 2   Magnesium 400 MG TABS Take 400 mg by mouth daily.     metoprolol succinate (TOPROL-XL) 25 MG 24 hr tablet Take 1 tablet (25 mg total) by mouth in the morning and at bedtime. 180 tablet 3   multivitamin (THERAGRAN) tablet Take 1 tablet by mouth daily.     nitroGLYCERIN (NITROSTAT) 0.4 MG SL tablet Place 1 tablet (0.4 mg total) under the tongue every 5 (five) minutes as needed for chest pain (Max 3 doses within 15 minutes call 911). 20 tablet 2   simvastatin (ZOCOR) 20 MG tablet Take 1 tablet (20 mg total) by mouth at bedtime. 90 tablet 3   simvastatin (ZOCOR) 20 MG tablet Take 1 tablet by mouth daily.     tamsulosin (FLOMAX) 0.4 MG CAPS capsule Take 2 capsules (0.8 mg total) by mouth daily. 180 capsule 3   traZODone (DESYREL) 50 MG tablet Take 0.5-1 tablets (25-50 mg total) by mouth at bedtime as needed for sleep. 90 tablet 3   aspirin 81 MG chewable tablet Chew by mouth.     No current  facility-administered medications on file prior to visit.        ROS:  All others reviewed and negative.  Objective        PE:  BP (!) 150/78 (BP Location: Left Arm, Patient Position: Sitting, Cuff Size: Normal)   Pulse 72   Temp 98.5 F (36.9 C) (Oral)   Ht _0  (1.676 m)   Wt 128 lb (58.1 kg)  SpO2 99%   BMI 20.66 kg/m                 Constitutional: Pt appears in NAD               HENT: Head: NCAT.                Right Ear: External ear normal.                 Left Ear: External ear normal.                Eyes: . Pupils are equal, round, and reactive to light. Conjunctivae and EOM are normal               Nose: without d/c or deformity               Neck: Neck supple. Gross normal ROM               Cardiovascular: Normal rate and regular rhythm.                 Pulmonary/Chest: Effort normal and breath sounds without rales or wheezing.                Abd:  Soft, NT, ND, + BS, no organomegaly               Neurological: Pt is alert. At baseline orientation, motor grossly intact               Skin: Skin is warm. No rashes, no other new lesions, LE edema - none               Psychiatric: Pt behavior is normal without agitation   Micro: none  Cardiac tracings I have personally interpreted today:  none  Pertinent Radiological findings (summarize): none   Lab Results  Component Value Date   WBC 6.7 08/12/2019   HGB 12.9 (L) 08/12/2019   HCT 38.9 (L) 08/12/2019   PLT 153.0 08/12/2019   GLUCOSE 96 08/12/2019   CHOL 172 08/12/2019   TRIG 128.0 08/12/2019   HDL 51.70 08/12/2019   LDLCALC 95 08/12/2019   ALT 14 08/12/2019   AST 21 08/12/2019   NA 141 08/12/2019   K 4.7 08/12/2019   CL 110 08/12/2019   CREATININE 1.48 08/12/2019   BUN 38 (H) 08/12/2019   CO2 25 08/12/2019   TSH 5.37 (H) 08/12/2019   PSA 2.08 08/12/2019   INR 1.49 04/18/2010   HGBA1C 5.7 03/11/2014   Assessment/Plan:  JACQUIS PAXTON is a 87 y.o. White or Caucasian [1] male with  has a past medical  history of Aortic valve disorders, Atherosclerosis of renal artery (HCC), Bilateral renal artery stenosis (HCC), BPH (benign prostatic hypertrophy), Carotid artery disease ([place]), Cerebrovascular disease, unspecified, Coronary atherosclerosis of unspecified type of vessel, native or graft, History of cystoscopy, History of echocardiogram, Malignant neoplasm of bladder, part unspecified, Melanoma in situ of back ([place]) (2012), Old myocardial infarction, Other and unspecified hyperlipidemia, Peripheral vascular disease, unspecified ([place]), Postsurgical aortocoronary bypass status, Postsurgical percutaneous transluminal coronary angioplasty status, Scarlet fever, and Unspecified sleep apnea.  BPH (benign prostatic hyperplasia) With transient symptoms of retention now improved back on flomax restarted, recent UA reportedly neg, exam benign, ok to continue same flomax for now,  to f/u any worsening symptoms or concerns  Essential hypertension BP Readings from Last 3 Encounters:  12/27/20 (!) 150/78  12/09/20 (!) 152/68  09/23/20 122/64  BP at home < 140/90 per wife and only uncontrolled here, o/w Stable, pt to continue medical treatment norvasc, lsiniopril, toprol   OAB (overactive bladder) Also urinary frequency and urgency worsening last year, prob OAB - for trial vesicare 5 qd  Hypothyroidism Lab Results  Component Value Date   TSH 5.37 (H) 08/12/2019   Stable, pt to continue levothyroxinem declines f/u lab for now  Followup: Return if symptoms worsen or fail to improve.  Cathlean Cower, MD 12/29/2020 9:58 PM [facility]

## 2020-12-27 NOTE — Patient Instructions (Signed)
Please take all new medication as prescribed - the generic for vesicare 5 mg per day - for the bladder   Please continue all other medications as before, including the tamsulosin for the prostate  Please have the pharmacy call with any other refills you may need.  Please continue your efforts at being more active, low cholesterol diet, and weight control.  Please keep your appointments with your specialists as you may have planned

## 2020-12-28 ENCOUNTER — Telehealth: Payer: Self-pay | Admitting: Internal Medicine

## 2020-12-28 MED ORDER — SOLIFENACIN SUCCINATE 5 MG PO TABS: 5.0000 mg | ORAL_TABLET | Freq: Every day | ORAL | 3 refills | Status: AC

## 2020-12-28 NOTE — Telephone Encounter (Signed)
Ok this was re -done

## 2020-12-28 NOTE — Telephone Encounter (Signed)
Patient called and said that pharmacy did not receive a prescription for Vesicare 5mg . He was wondering if it could be resent to [facility] [id number] - [place], [place]. Patient seen Dr. Jenny Reichmann on 12-27-20. Please advise

## 2020-12-29 ENCOUNTER — Encounter: Payer: Self-pay | Admitting: Internal Medicine

## 2020-12-29 NOTE — Assessment & Plan Note (Signed)
Also urinary frequency and urgency worsening last year, prob OAB - for trial vesicare 5 qd

## 2020-12-29 NOTE — Assessment & Plan Note (Signed)
With transient symptoms of retention now improved back on flomax restarted, recent UA reportedly neg, exam benign, ok to continue same flomax for now,  to f/u any worsening symptoms or concerns

## 2020-12-29 NOTE — Assessment & Plan Note (Signed)
BP Readings from Last 3 Encounters:  12/27/20 (!) 150/78  12/09/20 (!) 152/68  09/23/20 122/64   BP at home < 140/90 per wife and only uncontrolled here, o/w Stable, pt to continue medical treatment norvasc, lsiniopril, toprol

## 2020-12-29 NOTE — Assessment & Plan Note (Signed)
Lab Results  Component Value Date   TSH 5.37 (H) 08/12/2019   Stable, pt to continue levothyroxinem declines f/u lab for now

## 2021-01-01 DIAGNOSIS — M5416 Radiculopathy, lumbar region: Secondary | ICD-10-CM | POA: Diagnosis not present

## 2021-01-01 DIAGNOSIS — R252 Cramp and spasm: Secondary | ICD-10-CM | POA: Diagnosis not present

## 2021-01-01 DIAGNOSIS — M79605 Pain in left leg: Secondary | ICD-10-CM | POA: Diagnosis not present

## 2021-01-01 DIAGNOSIS — M25562 Pain in left knee: Secondary | ICD-10-CM | POA: Diagnosis not present

## 2021-01-03 ENCOUNTER — Telehealth: Payer: Self-pay | Admitting: Internal Medicine

## 2021-01-03 DIAGNOSIS — R3915 Urgency of urination: Secondary | ICD-10-CM

## 2021-01-03 NOTE — Telephone Encounter (Signed)
Okay.  Thanks.

## 2021-01-03 NOTE — Telephone Encounter (Signed)
Needs Referral to urologist for urgency Last saw Dr. Jenny Reichmann

## 2021-01-04 ENCOUNTER — Encounter: Payer: Self-pay | Admitting: Internal Medicine

## 2021-01-04 NOTE — Telephone Encounter (Signed)
Called pt daughter there was no answer [facility] referral has been placed.Marland KitchenJohny Chess

## 2021-01-06 DIAGNOSIS — M5416 Radiculopathy, lumbar region: Secondary | ICD-10-CM | POA: Diagnosis not present

## 2021-01-06 DIAGNOSIS — M5136 Other intervertebral disc degeneration, lumbar region: Secondary | ICD-10-CM | POA: Diagnosis not present

## 2021-01-06 DIAGNOSIS — G2581 Restless legs syndrome: Secondary | ICD-10-CM | POA: Diagnosis not present

## 2021-01-10 ENCOUNTER — Telehealth: Payer: Self-pay | Admitting: Cardiovascular Disease

## 2021-01-10 DIAGNOSIS — M79605 Pain in left leg: Secondary | ICD-10-CM

## 2021-01-10 DIAGNOSIS — I739 Peripheral vascular disease, unspecified: Secondary | ICD-10-CM

## 2021-01-10 NOTE — Telephone Encounter (Signed)
Patient's son states the patient is in constant leg pain, mainly in the left. He states he has had it for months. He states he is being treated for pain management in his back at [facility] with Dr. Davy Pique. He states based on the x ray he has a compressed vertebra, but the ones below are normal. He states they were told leg pain is usually from pinching in the lower vertebra so they think it is probably not causing the leg pain. Patient is not having any other symptoms and has no leg swelling.

## 2021-01-10 NOTE — Telephone Encounter (Signed)
LVM for return call. 

## 2021-01-11 ENCOUNTER — Other Ambulatory Visit: Payer: Self-pay | Admitting: Internal Medicine

## 2021-01-12 NOTE — Telephone Encounter (Signed)
Spoke with pt and pt's wife re message Pt complaining of bilateral leg pain but worse in the left Appears pt was to have repeat arterial doppler in 12 months and this is overdue Will forward message to Dr Burt Knack for review and recommendations ./cy

## 2021-01-13 NOTE — Telephone Encounter (Signed)
Agree with checking lower extremity doppler studies. Thank you

## 2021-01-14 NOTE — Telephone Encounter (Signed)
Patient's son was returning call

## 2021-01-14 NOTE — Telephone Encounter (Signed)
Spoke with pt and noted has appt scheduled for 01/31/21 to have lower extremity dop done the patient aware  that this was by recommendation from Dr Burt Knack .Adonis Housekeeper

## 2021-01-14 NOTE — Telephone Encounter (Signed)
Busy signal Will try later ./cy 

## 2021-01-14 NOTE — Telephone Encounter (Signed)
Lm to call back ./cy 

## 2021-01-20 ENCOUNTER — Other Ambulatory Visit (HOSPITAL_COMMUNITY): Payer: Self-pay | Admitting: Cardiovascular Disease

## 2021-01-20 DIAGNOSIS — I739 Peripheral vascular disease, unspecified: Secondary | ICD-10-CM

## 2021-01-21 DIAGNOSIS — R351 Nocturia: Secondary | ICD-10-CM | POA: Diagnosis not present

## 2021-01-21 DIAGNOSIS — N401 Enlarged prostate with lower urinary tract symptoms: Secondary | ICD-10-CM | POA: Diagnosis not present

## 2021-01-21 DIAGNOSIS — R350 Frequency of micturition: Secondary | ICD-10-CM | POA: Diagnosis not present

## 2021-01-21 DIAGNOSIS — R3915 Urgency of urination: Secondary | ICD-10-CM | POA: Diagnosis not present

## 2021-01-31 ENCOUNTER — Other Ambulatory Visit: Payer: Self-pay

## 2021-01-31 ENCOUNTER — Other Ambulatory Visit (HOSPITAL_COMMUNITY): Payer: Self-pay | Admitting: Cardiovascular Disease

## 2021-01-31 ENCOUNTER — Ambulatory Visit (HOSPITAL_COMMUNITY)
Admission: RE | Admit: 2021-01-31 | Discharge: 2021-01-31 | Disposition: A | Discharge status: Home / Self Care | Payer: Medicare Other | Source: Ambulatory Visit | Attending: Cardiovascular Disease | Admitting: Cardiovascular Disease

## 2021-01-31 DIAGNOSIS — M79605 Pain in left leg: Secondary | ICD-10-CM | POA: Diagnosis not present

## 2021-01-31 DIAGNOSIS — Z95820 Peripheral vascular angioplasty status with implants and grafts: Secondary | ICD-10-CM

## 2021-01-31 DIAGNOSIS — I739 Peripheral vascular disease, unspecified: Secondary | ICD-10-CM

## 2021-02-02 ENCOUNTER — Encounter: Payer: Self-pay | Admitting: Emergency Medicine

## 2021-02-02 ENCOUNTER — Ambulatory Visit: Payer: Medicare Other | Admitting: Emergency Medicine

## 2021-02-02 ENCOUNTER — Other Ambulatory Visit: Payer: Self-pay

## 2021-02-02 VITALS — BP 130/60 | HR 59 | Temp 97.8°F | Resp 18 | Ht 66.0 in | Wt 125.8 lb

## 2021-02-02 DIAGNOSIS — M5442 Lumbago with sciatica, left side: Secondary | ICD-10-CM

## 2021-02-02 DIAGNOSIS — M4726 Other spondylosis with radiculopathy, lumbar region: Secondary | ICD-10-CM

## 2021-02-02 DIAGNOSIS — M5431 Sciatica, right side: Secondary | ICD-10-CM | POA: Diagnosis not present

## 2021-02-02 DIAGNOSIS — I10 Essential (primary) hypertension: Secondary | ICD-10-CM

## 2021-02-02 DIAGNOSIS — G8929 Other chronic pain: Secondary | ICD-10-CM

## 2021-02-02 DIAGNOSIS — M5432 Sciatica, left side: Secondary | ICD-10-CM

## 2021-02-02 DIAGNOSIS — M5441 Lumbago with sciatica, right side: Secondary | ICD-10-CM

## 2021-02-02 MED ORDER — GABAPENTIN 100 MG PO CAPS: 200.0000 mg | ORAL_CAPSULE | Freq: Two times a day (BID) | ORAL | 1 refills | Status: DC

## 2021-02-02 NOTE — Patient Instructions (Addendum)
Take Tylenol every 6 hours as needed Increase gabapentin to 200 mg twice a day Follow-up with Dr. Alain Marion next week Sciatica  Sciatica is pain, weakness, tingling, or loss of feeling (numbness) along the sciatic nerve. The sciatic nerve starts in the lower back and goes down the back of each leg. Sciatica usually goes away on its own or with treatment. Sometimes, sciatica may come back (recur). What are the causes? This condition happens when the sciatic nerve is pinched or has pressure put on it. This may be the result of: A disk in between the bones of the spine bulging out too far (herniated disk). Changes in the spinal disks that occur with aging. A condition that affects a muscle in the butt. Extra bone growth near the sciatic nerve. A break (fracture) of the area between your hip bones (pelvis). Pregnancy. Tumor. This is rare. What increases the risk? You are more likely to develop this condition if you: Play sports that put pressure or stress on the spine. Have poor strength and ease of movement (flexibility). Have had a back injury in the past. Have had back surgery. Sit for long periods of time. Do activities that involve bending or lifting over and over again. Are very overweight (obese). What are the signs or symptoms? Symptoms can vary from mild to very bad. They may include: Any of these problems in the lower back, leg, hip, or butt: Mild tingling, loss of feeling, or dull aches. Burning sensations. Sharp pains. Loss of feeling in the back of the calf or the sole of the foot. Leg weakness. Very bad back pain that makes it hard to move. These symptoms may get worse when you cough, sneeze, or laugh. They may alsoget worse when you sit or stand for long periods of time. How is this treated? This condition often gets better without any treatment. However, treatment may include: Changing or cutting back on physical activity when you have pain. Doing exercises and  stretching. Putting ice or heat on the affected area. Medicines that help: To relieve pain and swelling. To relax your muscles. Shots (injections) of medicines that help to relieve pain, irritation, and swelling. Surgery. Follow these instructions at home: Medicines Take over-the-counter and prescription medicines only as told by your doctor. Ask your doctor if the medicine prescribed to you: Requires you to avoid driving or using heavy machinery. Can cause trouble pooping (constipation). You may need to take these steps to prevent or treat trouble pooping: Drink enough fluids to keep your pee (urine) pale yellow. Take over-the-counter or prescription medicines. Eat foods that are high in fiber. These include beans, whole grains, and fresh fruits and vegetables. Limit foods that are high in fat and sugar. These include fried or sweet foods. Managing pain     If told, put ice on the affected area. Put ice in a plastic bag. Place a towel between your skin and the bag. Leave the ice on for 20 minutes, 2-3 times a day. If told, put heat on the affected area. Use the heat source that your doctor tells you to use, such as a moist heat pack or a heating pad. Place a towel between your skin and the heat source. Leave the heat on for 20-30 minutes. Remove the heat if your skin turns bright red. This is very important if you are unable to feel pain, heat, or cold. You may have a greater risk of getting burned. Activity  Return to your normal activities as told  by your doctor. Ask your doctor what activities are safe for you. Avoid activities that make your symptoms worse. Take short rests during the day. When you rest for a long time, do some physical activity or stretching between periods of rest. Avoid sitting for a long time without moving. Get up and move around at least one time each hour. Exercise and stretch regularly, as told by your doctor. Do not lift anything that is heavier  than 10 lb (4.5 kg) while you have symptoms of sciatica. Avoid lifting heavy things even when you do not have symptoms. Avoid lifting heavy things over and over. When you lift objects, always lift in a way that is safe for your body. To do this, you should: Bend your knees. Keep the object close to your body. Avoid twisting.  General instructions Stay at a healthy weight. Wear comfortable shoes that support your feet. Avoid wearing high heels. Avoid sleeping on a mattress that is too soft or too hard. You might have less pain if you sleep on a mattress that is firm enough to support your back. Keep all follow-up visits as told by your doctor. This is important. Contact a doctor if: You have pain that: Wakes you up when you are sleeping. Gets worse when you lie down. Is worse than the pain you have had in the past. Lasts longer than 4 weeks. You lose weight without trying. Get help right away if: You cannot control when you pee (urinate) or poop (have a bowel movement). You have weakness in any of these areas and it gets worse: Lower back. The area between your hip bones. Butt. Legs. You have redness or swelling of your back. You have a burning feeling when you pee. Summary Sciatica is pain, weakness, tingling, or loss of feeling (numbness) along the sciatic nerve. This condition happens when the sciatic nerve is pinched or has pressure put on it. Sciatica can cause pain, tingling, or loss of feeling (numbness) in the lower back, legs, hips, and butt. Treatment often includes rest, exercise, medicines, and putting ice or heat on the affected area. This information is not intended to replace advice given to you by your health care provider. Make sure you discuss any questions you have with your healthcare provider. Document Revised: 07/22/2018 Document Reviewed: 07/22/2018 Elsevier Patient Education  [place].

## 2021-02-02 NOTE — Progress Notes (Signed)
[name] 87 y.o.   Chief Complaint  Patient presents with   Bilateral Leg Pain    Pain started back in March when he received a shot for his back. Went back for the 2nd shot in June and the pain to his legs has gotten worse. Even has issues with walking.    HISTORY OF PRESENT ILLNESS: This is a 87 y.o. male complaining of bilateral leg pain for couple of months.  Here with wife and daughter. Patient has chronic pain and has seen neurosurgeon and orthopedist in the past.  Was recently given injections to his lumbar area.  Recently started on gabapentin 100 mg twice a day.  Had recent studies done to evaluate lower extremity circulation.  Studies were normal. Patient denies any bowel or bladder issues.  Ambulatory.  Denies numbness or tingling to his legs. FINDINGS: Moderately severe degenerative changes of the visualized lower thoracic and lumbar spine, most severe at L2-3. At L2-3, there is endplate sclerosis, disc space narrowing, and endplate osteophytes. There is also slight retrolisthesis of L2 upon L3 measuring 8 mm.   Preserved vertebral body heights. No acute compression fracture, wedge-shaped deformity or focal kyphosis. No pars defects visualized. Associated levoscoliosis of the lumbar spine. Aortoiliac atherosclerosis noted.   IMPRESSION: Thoracolumbar degenerative changes as above. No acute finding by plain radiography   Aortic Atherosclerosis (ICD10-I70.0). Lower extremity vascular ultrasound done on 01/31/2021 as follows:  Summary:  Left: No significant change as compared to previous study.  Atherosclerosis in the left lower extremity.  30-49% stenosis in the common femoral and superficial femoral artery.  Patent superficial femoral artery stent.  Patent popliteal artery, s/p PTA.  Electronically Signed   By: Jerilynn Mages.  Shick M.D.   On: 09/25/2020 13:33  HPI   Prior to Admission medications   Medication Sig Start Date End Date Taking? Authorizing Provider   amLODipine (NORVASC) 10 MG tablet Take 1 tablet (10 mg total) by mouth daily. 08/25/20  Yes Plotnikov, Evie Lacks, MD  Ascorbic Acid (VITAMIN C PO) Take 1 capsule by mouth daily.   Yes [provider]  aspirin EC 81 MG tablet Take 1 tablet (81 mg total) by mouth daily. 09/26/17  Yes Weaver, Scott T, PA-C  B Complex-C (SUPER B COMPLEX PO) Take 1 tablet by mouth daily.   Yes [provider]  cholecalciferol (VITAMIN D) 1000 UNITS tablet Take 1,000 Units by mouth daily.   Yes [provider]  fish oil-omega-3 fatty acids 1000 MG capsule Take 1 g by mouth 2 (two) times daily.   Yes [provider]  lisinopril (ZESTRIL) 20 MG tablet Take 1 tablet (20 mg total) by mouth daily. 08/25/20  Yes Plotnikov, Evie Lacks, MD  loperamide (IMODIUM A-D) 2 MG tablet Take 1 tablet (2 mg total) by mouth 4 (four) times daily as needed for diarrhea or loose stools. 01/28/18  Yes Plotnikov, Evie Lacks, MD  Magnesium 400 MG TABS Take 400 mg by mouth daily.   Yes [provider]  metoprolol succinate (TOPROL-XL) 25 MG 24 hr tablet Take 1 tablet (25 mg total) by mouth in the morning and at bedtime. 08/25/20  Yes Plotnikov, Evie Lacks, MD  multivitamin [facility]) tablet Take 1 tablet by mouth daily.   Yes [provider]  nitroGLYCERIN (NITROSTAT) 0.4 MG SL tablet Place 1 tablet (0.4 mg total) under the tongue every 5 (five) minutes as needed for chest pain (Max 3 doses within 15 minutes call 911). 08/12/19  Yes Plotnikov, Tyrone Apple  V, MD  simvastatin (ZOCOR) 20 MG tablet Take 1 tablet (20 mg total) by mouth at bedtime. 08/25/20  Yes Plotnikov, Evie Lacks, MD  solifenacin (VESICARE) 5 MG tablet Take 1 tablet (5 mg total) by mouth daily. 12/28/20  Yes Biagio Borg, MD  tamsulosin (FLOMAX) 0.4 MG CAPS capsule Take 2 capsules (0.8 mg total) by mouth daily. 08/25/20  Yes Plotnikov, Evie Lacks, MD  traZODone (DESYREL) 50 MG tablet Take 0.5-1 tablets (25-50 mg total) by mouth at bedtime as needed  for sleep. 08/25/20  Yes Plotnikov, Evie Lacks, MD  gabapentin (NEURONTIN) 100 MG capsule Take 2 capsules (200 mg total) by mouth 2 (two) times daily. 02/02/21 05/03/21  Horald Pollen, MD    Allergies  Allergen Reactions   Sulfonamide Derivatives Hives   Lactose Diarrhea   Sulfa Antibiotics Hives    Patient Active Problem List   Diagnosis Date Noted   History of malignant neoplasm of bladder 12/27/2020   Bilateral tinnitus 12/27/2020   Encounter for fitting and adjustment of hearing aid 12/27/2020   Sensorineural hearing loss, bilateral 12/27/2020   OAB (overactive bladder) 12/27/2020   Allergic rhinitis 12/12/2020   Lumbar radiculopathy 10/18/2020   Lumbar degenerative disc disease 09/30/2020   Restless legs 09/30/2020   Chronic bilateral low back pain with right-sided sciatica 09/24/2020   Aortic valve disorder 08/29/2020   Bilateral leg edema 03/26/2020   Right arm pain 01/21/2019   Insomnia 07/30/2018   Diarrhea 01/28/2018   Presbycusis of both ears 04/17/2016   Hearing loss 01/24/2016   Elevated PSA 07/22/2015   Urinary tract infection, site not specified 07/21/2015   Edema 09/02/2014   Well adult exam 03/11/2014   Cramps of lower extremity 11/10/2013   Cerumen impaction 11/10/2013   Cervical disc disease 09/25/2012   Essential hypertension 06/11/2012   Bradycardia 04/17/2012   Need for prophylactic vaccination with tetanus-diphtheria (Td) 02/08/2012   Hypothyroidism 01/10/2011   TMJ SYNDROME 06/08/2010   RBBB 10/15/2009   Aortic stenosis 12/11/2008   Malignant neoplasm of bladder ([place]) 08/27/2007   Dyslipidemia 08/27/2007   MYOCARDIAL INFARCTION, HX OF 08/27/2007   Atherosclerosis of CABG w unsp angina pectoris ([place]) 08/27/2007   Coronary arteriosclerosis 08/27/2007   Cerebrovascular disease 08/27/2007   RENAL ARTERY STENOSIS 08/27/2007   Peripheral vascular disease ([place]) 08/27/2007   SLEEP APNEA 08/27/2007   BPH (benign prostatic hyperplasia) 08/27/2007    CORONARY ARTERY BYPASS GRAFT, HX OF 08/27/2007   PERCUTANEOUS TRANSLUMINAL CORONARY ANGIOPLASTY, HX OF 08/27/2007    Past Medical History:  Diagnosis Date   Aortic valve disorders    Echo 3/19: Mild LVH, EF 65-70, dynamic obstruction during Valsalva in mid cavity with mid cavity obliteration (peak velocity 200 cm/s, peak gradient 16 mmHg), normal wall motion, grade 1 diastolic dysfunction, AVR with mild stenosis (mean gradient 32, peak 56), MAC, mild to moderate MR, mild LAE   Atherosclerosis of renal artery (HCC)    Bilateral renal artery stenosis (HCC)    documented at the time of angiography by Dr. Albertine Patricia   BPH (benign prostatic hypertrophy)    Carotid artery disease ([place])    Carotid US 3/19: Bilateral ICA 40-59, follow-up 1 year   Cerebrovascular disease, unspecified    Coronary atherosclerosis of unspecified type of vessel, native or graft    History of cystoscopy    w/ bladder biopsy   History of echocardiogram    Echo 3/19: mild LVH, EF 65-70, dynamic obstruction during Valsalva with mid cavity obliteration (peak velocity of  200 cm/sec - peak gradient 16 mmHg), no RWMA, Gr 1 DD, s/p AVR, mild AS (mean 32, peak 56), MAC, mild to mod MR, mild LAE   Malignant neoplasm of bladder, part unspecified    Melanoma in situ of back ([place]) 2012   area of left scapula   Old myocardial infarction    Other and unspecified hyperlipidemia    Peripheral vascular disease, unspecified ([place])    history of stent left lower extremity placed by Dr. Albertine Patricia   Postsurgical aortocoronary bypass status    Postsurgical percutaneous transluminal coronary angioplasty status    Scarlet fever    Unspecified sleep apnea     Past Surgical History:  Procedure Laterality Date   CORONARY ARTERY BYPASS GRAFT     CYSTOSCOPY     w/ bladder biopsy   MELANOMA EXCISION  2012   left scapular  area - clean margins.    Percutaneous transluminal coronary angioplasty     redo median sternotomy     extracorporeal  circulation, aortic valve replacement using a 23-mm Mitroflow aortic pericardial heart valve.  Surgeon- Gilford Raid, MD 04-18-10    Social History   Socioeconomic History   Marital status: Married    Spouse name: Not on file   Number of children: 3   Years of education: 17   Highest education level: Not on file  Occupational History   Occupation: Lobbyist: RETIRED   Occupation: retired  Tobacco Use   Smoking status: Never   Smokeless tobacco: Never  Scientific laboratory technician Use: Never used  Substance and Sexual Activity   Alcohol use: Yes    Comment: rare   Drug use: No   Sexual activity: Not Currently  Other Topics Concern   Not on file  Social History Narrative   HSG, [employer], did some graduate work. Married '52. 1 son - '61; 2 daughters -'40, '63: grandchildren 7; great-grands 7. 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 Active gardner, Designer, jewellery. good marriage.   Social Determinants of Health   Financial Resource Strain: Low Risk    Difficulty of Paying Living Expenses: Not hard at all  Food Insecurity: No Food Insecurity   Worried About Charity fundraiser in the Last Year: Never true   [place] in the Last Year: Never true  Transportation Needs: No Transportation Needs   Lack of Transportation (Medical): No   Lack of Transportation (Non-Medical): No  Physical Activity: Sufficiently Active   Days of Exercise per Week: 5 days   Minutes of Exercise per Session: 30 min  Stress: No Stress Concern Present   Feeling of Stress : Not at all  Social Connections: Not on file  Intimate Partner Violence: Not At Risk   Fear of Current or Ex-Partner: No   Emotionally Abused: No   Physically Abused: No   Sexually Abused: No    Family History  Problem Relation Age of Onset   Hypertension Mother    Diabetes Mother    Hyperlipidemia Mother    Coronary artery disease Mother    Heart disease Mother        CAD, cardiomyopathy   Stroke  Mother    Coronary artery disease Father    Sudden death Father        1 wk post MI   Heart attack Father    Heart disease Father        CAD/MI-sudden death   Cancer Other  prostate   Birth defects Neg Hx      Review of Systems  Constitutional: Negative.  Negative for chills and fever.  HENT: Negative.  Negative for congestion and sore throat.   Respiratory: Negative.  Negative for cough and shortness of breath.   Cardiovascular: Negative.  Negative for chest pain and palpitations.  Gastrointestinal:  Negative for abdominal pain, diarrhea, nausea and vomiting.  Genitourinary: Negative.  Negative for dysuria and hematuria.  Musculoskeletal:  Positive for back pain.  Skin: Negative.  Negative for rash.  Neurological:  Negative for dizziness, sensory change, focal weakness and headaches.  All other systems reviewed and are negative.  Today's Vitals   02/02/21 1044  BP: 130/60  Pulse: (!) 59  Resp: 18  Temp: 97.8 F (36.6 C)  TempSrc: Oral  SpO2: 98%  Weight: 125 lb 12.8 oz (57.1 kg)  Height: _0  (1.676 m)   Body mass index is 20.3 kg/m.  Physical Exam Vitals reviewed.  Constitutional:      Appearance: Normal appearance.  HENT:     Head: Normocephalic.  Eyes:     Extraocular Movements: Extraocular movements intact.     Pupils: Pupils are equal, round, and reactive to light.  Cardiovascular:     Rate and Rhythm: Normal rate.  Pulmonary:     Effort: Pulmonary effort is normal.  Abdominal:     Palpations: Abdomen is soft.     Tenderness: There is no abdominal tenderness.  Musculoskeletal:     Comments: Lower extremities/ankles/feet: Warm to touch.  Mild ankle edema.  No erythema or ecchymosis.  No signs of chronic venous insufficiency.  Excellent peripheral pulses.  Excellent capillary refill.  Intact sensation.  Skin:    General: Skin is warm and dry.  Neurological:     General: No focal deficit present.     Mental Status: He is alert and oriented to  person, place, and time.     Sensory: No sensory deficit.     Motor: No weakness.     Deep Tendon Reflexes:     Reflex Scores:      Patellar reflexes are 3+ on the right side and 3+ on the left side.      Achilles reflexes are 2+ on the right side and 2+ on the left side. Psychiatric:        Mood and Affect: Mood normal.        Behavior: Behavior normal.  Able to get up from a chair and moving to examination table without any difficulty. Good strength of lower extremities.  Unremarkable findings on physical exam.   ASSESSMENT & PLAN: A total of 30 minutes was spent with the patient and counseling/coordination of care regarding preparing for this visit, review of most recent office visit notes, review of past medical history, review of all medications, differential diagnosis of bilateral leg pain, review of most recent lumbar x-rays, review of most recent vascular studies of both legs, pain management, prognosis, documentation and need to follow-up with Dr Alain Marion his PCP within the next 2 weeks.  Recommend to increase gabapentin to 200 mg twice a day.  Take Tylenol every 6 hours as needed. Follow-up with neurosurgeon/orthopedist on follow-up with PCP Dr. Alain Marion next week. Clinically stable.  No red flag signs or symptoms.  Conservative treatment and pain management is best recommendation.  Jahmal was seen today for bilateral leg pain.  Diagnoses and all orders for this visit:  Bilateral sciatica  Essential hypertension  Osteoarthritis of spine with  radiculopathy, lumbar region  Chronic bilateral low back pain with bilateral sciatica  Other orders -     gabapentin (NEURONTIN) 100 MG capsule; Take 2 capsules (200 mg total) by mouth 2 (two) times daily.  Patient Instructions  Take Tylenol every 6 hours as needed Increase gabapentin to 200 mg twice a day Follow-up with Dr. Alain Marion next week Sciatica  Sciatica is pain, weakness, tingling, or loss of feeling (numbness) along  the sciatic nerve. The sciatic nerve starts in the lower back and goes down the back of each leg. Sciatica usually goes away on its own or with treatment. Sometimes, sciatica may come back (recur). What are the causes? This condition happens when the sciatic nerve is pinched or has pressure put on it. This may be the result of: A disk in between the bones of the spine bulging out too far (herniated disk). Changes in the spinal disks that occur with aging. A condition that affects a muscle in the butt. Extra bone growth near the sciatic nerve. A break (fracture) of the area between your hip bones (pelvis). Pregnancy. Tumor. This is rare. What increases the risk? You are more likely to develop this condition if you: Play sports that put pressure or stress on the spine. Have poor strength and ease of movement (flexibility). Have had a back injury in the past. Have had back surgery. Sit for long periods of time. Do activities that involve bending or lifting over and over again. Are very overweight (obese). What are the signs or symptoms? Symptoms can vary from mild to very bad. They may include: Any of these problems in the lower back, leg, hip, or butt: Mild tingling, loss of feeling, or dull aches. Burning sensations. Sharp pains. Loss of feeling in the back of the calf or the sole of the foot. Leg weakness. Very bad back pain that makes it hard to move. These symptoms may get worse when you cough, sneeze, or laugh. They may alsoget worse when you sit or stand for long periods of time. How is this treated? This condition often gets better without any treatment. However, treatment may include: Changing or cutting back on physical activity when you have pain. Doing exercises and stretching. Putting ice or heat on the affected area. Medicines that help: To relieve pain and swelling. To relax your muscles. Shots (injections) of medicines that help to relieve pain, irritation, and  swelling. Surgery. Follow these instructions at home: Medicines Take over-the-counter and prescription medicines only as told by your doctor. Ask your doctor if the medicine prescribed to you: Requires you to avoid driving or using heavy machinery. Can cause trouble pooping (constipation). You may need to take these steps to prevent or treat trouble pooping: Drink enough fluids to keep your pee (urine) pale yellow. Take over-the-counter or prescription medicines. Eat foods that are high in fiber. These include beans, whole grains, and fresh fruits and vegetables. Limit foods that are high in fat and sugar. These include fried or sweet foods. Managing pain     If told, put ice on the affected area. Put ice in a plastic bag. Place a towel between your skin and the bag. Leave the ice on for 20 minutes, 2-3 times a day. If told, put heat on the affected area. Use the heat source that your doctor tells you to use, such as a moist heat pack or a heating pad. Place a towel between your skin and the heat source. Leave the heat on for 20-30  minutes. Remove the heat if your skin turns bright red. This is very important if you are unable to feel pain, heat, or cold. You may have a greater risk of getting burned. Activity  Return to your normal activities as told by your doctor. Ask your doctor what activities are safe for you. Avoid activities that make your symptoms worse. Take short rests during the day. When you rest for a long time, do some physical activity or stretching between periods of rest. Avoid sitting for a long time without moving. Get up and move around at least one time each hour. Exercise and stretch regularly, as told by your doctor. Do not lift anything that is heavier than 10 lb (4.5 kg) while you have symptoms of sciatica. Avoid lifting heavy things even when you do not have symptoms. Avoid lifting heavy things over and over. When you lift objects, always lift in a way  that is safe for your body. To do this, you should: Bend your knees. Keep the object close to your body. Avoid twisting.  General instructions Stay at a healthy weight. Wear comfortable shoes that support your feet. Avoid wearing high heels. Avoid sleeping on a mattress that is too soft or too hard. You might have less pain if you sleep on a mattress that is firm enough to support your back. Keep all follow-up visits as told by your doctor. This is important. Contact a doctor if: You have pain that: Wakes you up when you are sleeping. Gets worse when you lie down. Is worse than the pain you have had in the past. Lasts longer than 4 weeks. You lose weight without trying. Get help right away if: You cannot control when you pee (urinate) or poop (have a bowel movement). You have weakness in any of these areas and it gets worse: Lower back. The area between your hip bones. Butt. Legs. You have redness or swelling of your back. You have a burning feeling when you pee. Summary Sciatica is pain, weakness, tingling, or loss of feeling (numbness) along the sciatic nerve. This condition happens when the sciatic nerve is pinched or has pressure put on it. Sciatica can cause pain, tingling, or loss of feeling (numbness) in the lower back, legs, hips, and butt. Treatment often includes rest, exercise, medicines, and putting ice or heat on the affected area. This information is not intended to replace advice given to you by your health care provider. Make sure you discuss any questions you have with your healthcare provider. Document Revised: 07/22/2018 Document Reviewed: 07/22/2018 Elsevier Patient Education  2022 [place], MD [facility]

## 2021-02-22 ENCOUNTER — Encounter: Payer: Self-pay | Admitting: Internal Medicine

## 2021-02-22 ENCOUNTER — Ambulatory Visit: Payer: Medicare Other | Admitting: Internal Medicine

## 2021-02-22 ENCOUNTER — Other Ambulatory Visit: Payer: Self-pay

## 2021-02-22 DIAGNOSIS — M5416 Radiculopathy, lumbar region: Secondary | ICD-10-CM | POA: Diagnosis not present

## 2021-02-22 MED ORDER — ACETAMINOPHEN-CODEINE 300-30 MG PO TABS: 0.5000 | ORAL_TABLET | ORAL | 0 refills | Status: DC | PRN

## 2021-02-22 MED ORDER — METHYLPREDNISOLONE 4 MG PO TBPK: ORAL_TABLET | ORAL | 0 refills | Status: DC

## 2021-02-22 NOTE — Progress Notes (Signed)
Subjective:  Patient ID: [name], male    DOB: 12-22-1928  Age: 87 y.o. MRN: [id number]  CC: Leg Pain (Wife states pain starts at his tailbone goes down legs)   HPI [name] presents for severe leg pain - seeing Dr Davy Pique for LBP - shots - they did not help, worse - pain is 10/10 Pain is severe. Pain is B - worse w/walking. Better w/leaning. He fell once - R leg gave out. Circulation is good Gabapentin did not help  Outpatient Medications Prior to Visit  Medication Sig Dispense Refill   amLODipine (NORVASC) 10 MG tablet Take 1 tablet (10 mg total) by mouth daily. 90 tablet 3   Ascorbic Acid (VITAMIN C PO) Take 1 capsule by mouth daily.     aspirin EC 81 MG tablet Take 1 tablet (81 mg total) by mouth daily. 90 tablet 3   B Complex-C (SUPER B COMPLEX PO) Take 1 tablet by mouth daily.     cholecalciferol (VITAMIN D) 1000 UNITS tablet Take 1,000 Units by mouth daily.     fish oil-omega-3 fatty acids 1000 MG capsule Take 1 g by mouth 2 (two) times daily.     gabapentin (NEURONTIN) 100 MG capsule Take 2 capsules (200 mg total) by mouth 2 (two) times daily. 360 capsule 1   lisinopril (ZESTRIL) 20 MG tablet Take 1 tablet (20 mg total) by mouth daily. 90 tablet 3   loperamide (IMODIUM A-D) 2 MG tablet Take 1 tablet (2 mg total) by mouth 4 (four) times daily as needed for diarrhea or loose stools. 30 tablet 2   Magnesium 400 MG TABS Take 400 mg by mouth daily.     metoprolol succinate (TOPROL-XL) 25 MG 24 hr tablet Take 1 tablet (25 mg total) by mouth in the morning and at bedtime. 180 tablet 3   multivitamin (THERAGRAN) tablet Take 1 tablet by mouth daily.     nitroGLYCERIN (NITROSTAT) 0.4 MG SL tablet Place 1 tablet (0.4 mg total) under the tongue every 5 (five) minutes as needed for chest pain (Max 3 doses within 15 minutes call 911). 20 tablet 2   simvastatin (ZOCOR) 20 MG tablet Take 1 tablet (20 mg total) by mouth at bedtime. 90 tablet 3   solifenacin (VESICARE) 5 MG tablet  Take 1 tablet (5 mg total) by mouth daily. 90 tablet 3   tamsulosin (FLOMAX) 0.4 MG CAPS capsule Take 2 capsules (0.8 mg total) by mouth daily. 180 capsule 3   traZODone (DESYREL) 50 MG tablet Take 0.5-1 tablets (25-50 mg total) by mouth at bedtime as needed for sleep. 90 tablet 3   No facility-administered medications prior to visit.    ROS: Review of Systems  Constitutional:  Positive for fatigue. Negative for appetite change and unexpected weight change.  HENT:  Negative for congestion, nosebleeds, sneezing, sore throat and trouble swallowing.   Eyes:  Negative for itching and visual disturbance.  Respiratory:  Negative for cough.   Cardiovascular:  Negative for chest pain, palpitations and leg swelling.  Gastrointestinal:  Negative for abdominal distention, blood in stool, diarrhea and nausea.  Genitourinary:  Negative for frequency and hematuria.  Musculoskeletal:  Positive for back pain and gait problem. Negative for joint swelling and neck pain.  Skin:  Negative for rash.  Neurological:  Positive for weakness. Negative for dizziness, tremors and speech difficulty.  Psychiatric/Behavioral:  Negative for agitation, dysphoric mood, sleep disturbance and suicidal ideas. The patient is not nervous/anxious.    Objective:  BP (!) 142/60 (BP Location: Left Arm)   Pulse 67   Temp 98 F (36.7 C) (Oral)   Ht '5\' 6"'$  (1.676 m)   Wt 126 lb 6.4 oz (57.3 kg)   SpO2 98%   BMI 20.40 kg/m   BP Readings from Last 3 Encounters:  02/22/21 (!) 142/60  02/02/21 130/60  12/27/20 (!) 150/78    Wt Readings from Last 3 Encounters:  02/22/21 126 lb 6.4 oz (57.3 kg)  02/02/21 125 lb 12.8 oz (57.1 kg)  12/27/20 128 lb (58.1 kg)    Physical Exam Constitutional:      General: He is not in acute distress.    Appearance: He is well-developed. He is obese.     Comments: NAD  Eyes:     Conjunctiva/sclera: Conjunctivae normal.     Pupils: Pupils are equal, round, and reactive to light.  Neck:      Thyroid: No thyromegaly.     Vascular: No JVD.  Cardiovascular:     Rate and Rhythm: Normal rate and regular rhythm.     Heart sounds: Normal heart sounds. No murmur heard.   No friction rub. No gallop.  Pulmonary:     Effort: Pulmonary effort is normal. No respiratory distress.     Breath sounds: Normal breath sounds. No wheezing or rales.  Chest:     Chest wall: No tenderness.  Abdominal:     General: Bowel sounds are normal. There is no distension.     Palpations: Abdomen is soft. There is no mass.     Tenderness: There is no abdominal tenderness. There is no guarding or rebound.  Musculoskeletal:        General: No tenderness. Normal range of motion.     Cervical back: Normal range of motion.  Lymphadenopathy:     Cervical: No cervical adenopathy.  Skin:    General: Skin is warm and dry.     Findings: No rash.  Neurological:     Mental Status: He is alert and oriented to person, place, and time.     Cranial Nerves: No cranial nerve deficit.     Motor: Weakness present. No abnormal muscle tone.     Coordination: Coordination abnormal.     Gait: Gait abnormal.     Deep Tendon Reflexes: Reflexes are normal and symmetric.  Psychiatric:        Behavior: Behavior normal.        Thought Content: Thought content normal.        Judgment: Judgment normal.   Antalgic gait Using a cane LS painful Legs are weaker   Lab Results  Component Value Date   WBC 6.7 08/12/2019   HGB 12.9 (L) 08/12/2019   HCT 38.9 (L) 08/12/2019   PLT 153.0 08/12/2019   GLUCOSE 96 08/12/2019   CHOL 172 08/12/2019   TRIG 128.0 08/12/2019   HDL 51.70 08/12/2019   LDLCALC 95 08/12/2019   ALT 14 08/12/2019   AST 21 08/12/2019   NA 141 08/12/2019   K 4.7 08/12/2019   CL 110 08/12/2019   CREATININE 1.48 08/12/2019   BUN 38 (H) 08/12/2019   CO2 25 08/12/2019   TSH 5.37 (H) 08/12/2019   PSA 2.08 08/12/2019   INR 1.49 04/18/2010   HGBA1C 5.7 03/11/2014    VAS [place] ABI WITH/WO TBI  Result Date:  01/31/2021  LOWER EXTREMITY DOPPLER STUDY Patient Name:  [name]  Date of Exam:   01/31/2021 Medical Rec #: [id number]  Accession #:    [id number] Date of Birth: [birth year]        Patient Gender: M Patient Age:   34Y Exam Location:  [facility] Procedure:      VAS [place] ABI WITH/WO TBI Referring Phys: [id number] MICHAEL COOPER --------------------------------------------------------------------------------  Indications: Peripheral artery disease. High Risk Factors: Hypertension, hyperlipidemia, no history of smoking, prior                    MI, coronary artery disease. Other Factors: Pt complaining of bilateral leg pain but worse in the left. He                states his legs hurt from the moment he wakes up til he goes to                bed at night. Denies any claudication symptoms.  Vascular Interventions: Left SFA stent and PTA of the left popliteal artery in                         07/2003. Comparison Study: 09/2017-.89 on the right and 1.05 on the left. Performing Technologist: Wilkie Aye RVT  Examination Guidelines: A complete evaluation includes at minimum, Doppler waveform signals and systolic blood pressure reading at the level of bilateral brachial, anterior tibial, and posterior tibial arteries, when vessel segments are accessible. Bilateral testing is considered an integral part of a complete examination. Photoelectric Plethysmograph (PPG) waveforms and toe systolic pressure readings are included as required and additional duplex testing as needed. Limited examinations for reoccurring indications may be performed as noted.  ABI Findings: +---------+------------------+-----+--------+--------+ Right    Rt Pressure (mmHg)IndexWaveformComment  +---------+------------------+-----+--------+--------+ Brachial 153                                     +---------+------------------+-----+--------+--------+ ATA      122               0.78 biphasic          +---------+------------------+-----+--------+--------+ PTA      118               0.75 biphasic         +---------+------------------+-----+--------+--------+ PERO     123               0.78 biphasic         +---------+------------------+-----+--------+--------+ Great Toe91                0.58 Abnormal         +---------+------------------+-----+--------+--------+ +---------+------------------+-----+---------+-------+ Left     Lt Pressure (mmHg)IndexWaveform Comment +---------+------------------+-----+---------+-------+ Brachial 157                                     +---------+------------------+-----+---------+-------+ ATA      166               1.06 triphasic        +---------+------------------+-----+---------+-------+ PTA      177               1.13 triphasic        +---------+------------------+-----+---------+-------+ PERO     175               1.11 triphasic        +---------+------------------+-----+---------+-------+ Mitzi Davenport  0.82 Normal           +---------+------------------+-----+---------+-------+ +-------+-----------+-----------+------------+------------+ ABI/TBIToday's ABIToday's TBIPrevious ABIPrevious TBI +-------+-----------+-----------+------------+------------+ Right  0.78       0.58       0.89        0.60         +-------+-----------+-----------+------------+------------+ Left   1.13       0.82       1.05        0.82         +-------+-----------+-----------+------------+------------+  Bilateral TBIs and ABIs appear essentially unchanged compared to prior study on 09/2017.  Summary: Right: Resting right ankle-brachial index indicates moderate right lower extremity arterial disease. The right toe-brachial index is abnormal. Left: Resting left ankle-brachial index is within normal range. No evidence of significant left lower extremity arterial disease. The left toe-brachial index is normal.  *See table(s)  above for measurements and observations.  Suggest follow up study in 12 months. Electronically signed by Larae Grooms MD on 01/31/2021 at 9:14:40 PM.    Final    VAS [place] LOWER EXTREMITY ARTERIAL DUPLEX  Result Date: 01/31/2021 LOWER EXTREMITY ARTERIAL DUPLEX STUDY Patient Name:  KANNAN PAMINTUAN  Date of Exam:   01/31/2021 Medical Rec #: [id number]       Accession #:    [id number] Date of Birth: 09-21-1928        Patient Gender: M Patient Age:   57Y Exam Location:  [facility] Procedure:      VAS [place] LOWER EXTREMITY ARTERIAL DUPLEX Referring Phys: Fleming --------------------------------------------------------------------------------  Indications: Peripheral artery disease. High Risk Factors: Hypertension, hyperlipidemia, no history of smoking, prior                    MI, coronary artery disease. Other Factors: Pt complaining of bilateral leg pain but worse in the left. He                states his legs hurt from the moment he wakes up til he goes to                bed at night. Denies any claudication symptoms.  Vascular Interventions: Left SFA stent and PTA of the left popliteal artery in                         07/2003. Current ABI:            Right 0.78; Left 1.13 Comparison Study: 09/2017-Showed a patent left SFA stent and popliteal artery,                   s/p PTA. Performing Technologist: Wilkie Aye RVT  Examination Guidelines: A complete evaluation includes B-mode imaging, spectral Doppler, color Doppler, and power Doppler as needed of all accessible portions of each vessel. Bilateral testing is considered an integral part of a complete examination. Limited examinations for reoccurring indications may be performed as noted.   +----------+--------+-----+---------------+---------+---------+ LEFT      PSV cm/sRatioStenosis       Waveform Comments  +----------+--------+-----+---------------+---------+---------+ CFA Prox  136                         biphasic            +----------+--------+-----+---------------+---------+---------+ CFA Distal195          30-49% stenosistriphasic          +----------+--------+-----+---------------+---------+---------+ DFA  215                         triphasic          +----------+--------+-----+---------------+---------+---------+ SFA Prox  182          30-49% stenosisbiphasic turbulent +----------+--------+-----+---------------+---------+---------+ SFA Mid   92                          biphasic turbulent +----------+--------+-----+---------------+---------+---------+ POP Prox  92                          biphasic           +----------+--------+-----+---------------+---------+---------+ POP Distal62                          biphasic           +----------+--------+-----+---------------+---------+---------+ TP Trunk  107                         biphasic           +----------+--------+-----+---------------+---------+---------+  Left Stent(s): +---------------+--------+--------+--------+--------+ distal SFA     PSV cm/sStenosisWaveformComments +---------------+--------+--------+--------+--------+ Prox to Stent  66              biphasic         +---------------+--------+--------+--------+--------+ Proximal Stent 83              biphasic         +---------------+--------+--------+--------+--------+ Mid Stent      84              biphasic         +---------------+--------+--------+--------+--------+ Distal Stent   93              biphasic         +---------------+--------+--------+--------+--------+ Distal to Stent64              biphasic         +---------------+--------+--------+--------+--------+  Summary: Left: No significant change as compared to previous study. Atherosclerosis in the left lower extremity. 30-49% stenosis in the common femoral and superficial femoral artery. Patent superficial femoral artery stent. Patent popliteal artery, s/p PTA.  See  table(s) above for measurements and observations. Suggest follow up study in 12 months. Electronically signed by Larae Grooms MD on 01/31/2021 at 9:19:50 PM.    Final     Assessment & Plan:     Walker Kehr, MD

## 2021-02-22 NOTE — Assessment & Plan Note (Signed)
2022 B spinal stenosis sx's Discussed w/pt, his wife and dtr PT offered D/c Gabapentin - not helping Medrol pack Opioids discussed - will try T#3

## 2021-02-22 NOTE — Patient Instructions (Signed)
[employer] neurological surgery (7th ed., pp. [id number]). [place], [place]: [employer]."> [employer] (7th ed., pp. [phone]). [place], [place]: [employer].">  Spinal Stenosis  Spinal stenosis is a condition that happens when the spinal canal narrows. The spinal canal is the space between the bones of your spine (vertebrae). This narrowing puts pressure on the spinal cord or nerves. Spinal stenosiscan affect the vertebrae in the neck, upper back, and lower back. This condition can range from mild to severe. In some cases, there are nosymptoms. What are the causes? This condition is caused by areas of bone pushing into the spinal canal. This condition may be present at birth (congenital), or it may be caused by: Slow breakdown of your vertebrae (spinal degeneration). This usually starts around 85 years of age. Injury (trauma) to your spine. Tumors in your spine. Calcium deposits in your spine. What increases the risk? The following factors may make you more likely to develop this condition: Being older than age 39. Having a problem present at birth with an abnormally shaped spine (congenitalspinal deformity), such as scoliosis. Having arthritis. What are the signs or symptoms? Symptoms of this condition include: Pain in the neck or back that is generally worse with activities, particularly when you stand or walk. Numbness, tingling, hot or cold sensations, weakness, or tiredness (fatigue) in your leg or legs. Pain going from the buttock, down the thigh, and to the calf (sciatica). This can happen in one or both legs. Frequent episodes of falling. A foot-slapping gait that leads to muscle weakness. In more severe cases, you may develop: Problems having a bowel movement or urinating. Difficulty having sex. Loss of feeling in your legs and inability to walk. Symptoms may come on slowly and get worse over time. In some cases, there are no symptoms. How is this  diagnosed? This condition is diagnosed based on your medical history and a physical exam.You will also have tests, such as an MRI, a CT scan, or an X-ray. How is this treated? Treatment for this condition often focuses on managing your pain and any other symptoms. Treatment may include: Practicing good posture to lessen pressure on your nerves. Exercising to strengthen muscles, build endurance, improve balance, and maintain range of motion. This may include physical therapy to restore movement and strength to your back. Losing weight, if needed. Medicines to reduce inflammation or pain. This may include a medicine that is injected into your spine (steroidinjection). Assistive devices, such as a corset or brace. In some cases, surgery may be needed. The most common procedure is decompression laminectomy. This is done to remove excess bone that putspressure on your nerve roots. Follow these instructions at home: Managing pain, stiffness, and swelling  Practice good posture. If you were given a brace or a corset, wear it as told by your health care provider. Maintain a healthy weight. Talk with your health care provider if you need help losing weight. If directed, apply heat to the affected area as often as told by your health care provider. Use the heat source that your health care provider recommends, such as a moist heat pack or a heating pad. Place a towel between your skin and the heat source. Leave the heat on for 20-30 minutes. Remove the heat if your skin turns bright red. This is especially important if you are unable to feel pain, heat, or cold. You may have a greater risk of getting burned.  Activity Do all exercises and stretches as told by your  health care provider. Do not do any activities that cause pain. Ask your health care provider what activities are safe for you. Do not lift anything that is heavier than 10 lb (4.5 kg), or the limit that you are told by your health care  provider. Return to your normal activities as told by your health care provider. Ask your health care provider what activities are safe for you. General instructions Take over-the-counter and prescription medicines only as told by your health care provider. Do not use any products that contain nicotine or tobacco, such as cigarettes, e-cigarettes, and chewing tobacco. If you need help quitting, ask your health care provider. Eat a healthy diet. This includes plenty of fruits and vegetables, whole grains, and low-fat (lean) protein. Keep all follow-up visits as told by your health care provider. This is important. Contact a health care provider if: Your symptoms do not get better or they get worse. You have a fever. Get help right away if: You have new pain or symptoms of severe pain, such as: New or worsening pain in your neck or upper back. Severe pain that cannot be controlled with medicines. A severe headache that gets worse when you stand. You are dizzy. You have vision problems, such as blurred vision or double vision. You have nausea or you vomit. You develop new or worsening numbness or tingling in your back or legs. You have pain, redness, swelling, or warmth in your arm or leg. Summary Spinal stenosis is a condition that happens when the spinal canal narrows. The spinal canal is the space between the bones of your spine (vertebrae). This narrowing puts pressure on the spinal cord or nerves. This condition may be caused by a birth defect, breakdown of your vertebrae, trauma, tumors, or calcium deposits. Spinal stenosis can cause numbness, weakness, or pain in the buttocks, neck, back, and legs. This condition is usually diagnosed with your medical history, a physical exam, and tests, such as an MRI, a CT scan, or an X-ray. This information is not intended to replace advice given to you by your health care provider. Make sure you discuss any questions you have with your healthcare  provider. Document Revised: 05/01/2019 Document Reviewed: 05/01/2019 Elsevier Patient Education  2022 [employer].

## 2021-03-01 ENCOUNTER — Other Ambulatory Visit: Payer: Self-pay | Admitting: Internal Medicine

## 2021-03-02 ENCOUNTER — Other Ambulatory Visit: Payer: Self-pay

## 2021-03-02 ENCOUNTER — Encounter: Payer: Self-pay | Admitting: Physical Therapy

## 2021-03-02 ENCOUNTER — Ambulatory Visit: Payer: Medicare Other | Attending: Internal Medicine | Admitting: Physical Therapy

## 2021-03-02 DIAGNOSIS — M6281 Muscle weakness (generalized): Secondary | ICD-10-CM | POA: Diagnosis not present

## 2021-03-02 DIAGNOSIS — R2689 Other abnormalities of gait and mobility: Secondary | ICD-10-CM | POA: Insufficient documentation

## 2021-03-02 DIAGNOSIS — M545 Low back pain, unspecified: Secondary | ICD-10-CM | POA: Diagnosis not present

## 2021-03-02 DIAGNOSIS — G8929 Other chronic pain: Secondary | ICD-10-CM

## 2021-03-02 NOTE — Therapy (Signed)
[name], [place], [address] Phone: [phone]   Fax:  [phone]  Physical Therapy Evaluation  Patient Details  Name: [name] MRN: [id number] Date of Birth: 26-Aug-1928 Referring Provider (PT): Plotnikov, Evie Lacks, MD   Encounter Date: 03/02/2021   PT End of Session - 03/02/21 1151     Visit Number 1    Number of Visits 6    Date for PT Re-Evaluation 04/13/21    Authorization Type BCBS MEDICARE    PT Start Time 1200    PT Stop Time 7741    PT Time Calculation (min) 45 min    Activity Tolerance Patient tolerated treatment well    Behavior During Therapy [facility] for tasks assessed/performed             Past Medical History:  Diagnosis Date   Aortic valve disorders    Echo 3/19: Mild LVH, EF 65-70, dynamic obstruction during Valsalva in mid cavity with mid cavity obliteration (peak velocity 200 cm/s, peak gradient 16 mmHg), normal wall motion, grade 1 diastolic dysfunction, AVR with mild stenosis (mean gradient 32, peak 56), MAC, mild to moderate MR, mild LAE   Atherosclerosis of renal artery ([place])    Bilateral renal artery stenosis ([place])    documented at the time of angiography by Dr. Albertine Patricia   BPH (benign prostatic hypertrophy)    Carotid artery disease ([place])    Carotid US 3/19: Bilateral ICA 40-59, follow-up 1 year   Cerebrovascular disease, unspecified    Coronary atherosclerosis of unspecified type of vessel, native or graft    History of cystoscopy    w/ bladder biopsy   History of echocardiogram    Echo 3/19: mild LVH, EF 65-70, dynamic obstruction during Valsalva with mid cavity obliteration (peak velocity of 200 cm/sec - peak gradient 16 mmHg), no RWMA, Gr 1 DD, s/p AVR, mild AS (mean 32, peak 56), MAC, mild to mod MR, mild LAE   Malignant neoplasm of bladder, part unspecified    Melanoma in situ of back ([place]) 2012   area of left scapula   Old myocardial infarction    Other and  unspecified hyperlipidemia    Peripheral vascular disease, unspecified ([place])    history of stent left lower extremity placed by Dr. Albertine Patricia   Postsurgical aortocoronary bypass status    Postsurgical percutaneous transluminal coronary angioplasty status    Scarlet fever    Unspecified sleep apnea     Past Surgical History:  Procedure Laterality Date   CORONARY ARTERY BYPASS GRAFT     CYSTOSCOPY     w/ bladder biopsy   MELANOMA EXCISION  2012   left scapular  area - clean margins.    Percutaneous transluminal coronary angioplasty     redo median sternotomy     extracorporeal circulation, aortic valve replacement using a 23-mm Mitroflow aortic pericardial heart valve.  Surgeon- Gilford Raid, MD 04-18-10    There were no vitals filed for this visit.    Subjective Assessment - 03/02/21 1150     Subjective Patient reports he had some back trouble in March 2022 that started gradually without specific mechanism. He saw his doctor who gave him a shot, then he had some trouble with his legs after that first shot and was given a second shot which after that patient reports his legs gave up on him. Patient reports he continues to have low back pain and bilateral leg pain/weakness. Overall, pain seems to be related  to both legs, left worse than right, and patient reports it is the entire leg. He has started to use a cane for walking due to this. He had his circulation checked and that seemed to be fine. He has to sit down very gently because of the pain, and then generally moving around exacerbates his pain, walking, getting in and out of the car, changing positions such as sitting/standing, standing. Patient's daughter reports his PCP referred him to PT for pain relief and instruction on TENS unit.    Patient is accompained by: Family member   daughter   Limitations Lifting;Standing;Walking;House hold activities    How long can you sit comfortably? "for a while"    How long can you walk comfortably?  10 minutes    Diagnostic tests X-ray    Patient Stated Goals Imporve pain level to improve walking and getting around    Currently in Pain? Yes    Pain Score 5     Pain Location Back    Pain Orientation Lower    Pain Descriptors / Indicators Sharp;Shooting    Pain Type Chronic pain    Pain Radiating Towards bilateral legs    Pain Onset More than a month ago    Pain Frequency Constant    Aggravating Factors  General moving or activity, walking, changing positions    Pain Relieving Factors Medication                [facility] PT Assessment - 03/02/21 0001       Assessment   Medical Diagnosis Lumbar radiculopathy    Referring Provider (PT) Plotnikov, Evie Lacks, MD    Onset Date/Surgical Date --   March 2022   Next MD Visit Not scheduled    Prior Therapy None      Precautions   Precautions None      Restrictions   Weight Bearing Restrictions No      Balance Screen   Has the patient fallen in the past 6 months Yes    How many times? 1 - fell in driveway, felt like right leg quit on him    Has the patient had a decrease in activity level because of a fear of falling?  No    Is the patient reluctant to leave their home because of a fear of falling?  No      Prior Function   Level of Independence Independent    Vocation Retired      Charity fundraiser Status Within Functional Limits for tasks assessed      Observation/Other Assessments   Observations Patient appears in no apparent distress    Focus on Therapeutic Outcomes (FOTO)  46% functional status      Sensation   Light Touch Appears Intact      Coordination   Gross Motor Movements are Fluid and Coordinated Yes      Functional Tests   Functional tests Sit to Stand      Sit to Stand   Comments Patient requires BUE for assist      Posture/Postural Control   Posture Comments Rounded shoulder, decreased lumbar lordosis      ROM / Strength   AROM / PROM / Strength AROM;PROM;Strength      AROM    AROM Assessment Site Lumbar    Lumbar Flexion 75%   hamstring tightness noted, required knee bend   Lumbar Extension <25%   assessed seated due to balance deficit   Lumbar - Right  Rotation 50%    Lumbar - Left Rotation 50%      PROM   Overall PROM Comments Hip PROM grossly WFL except IR limited bilaterally, non-painful      Strength   Strength Assessment Site Hip;Knee    Right/Left Hip Right;Left    Right Hip Flexion 4/5    Right Hip Extension 3/5    Right Hip ABduction 3/5    Left Hip Flexion 4/5    Left Hip Extension 3/5    Left Hip ABduction 3/5    Right/Left Knee Right;Left    Right Knee Flexion 5/5    Right Knee Extension 5/5    Left Knee Flexion 5/5    Left Knee Extension 5/5      Flexibility   Soft Tissue Assessment /Muscle Length yes    Hamstrings Limited ~45 deg SLR bilaterally    Quadriceps Limited ~ 90 deg prone knee flexion bilaterally    Piriformis Limited bilaterally      Palpation   Spinal mobility Hypomobile throughout lumbar spine, lower lumbar CPAs reproduced sharp localized low back pain, no LE referral pain    Palpation comment Mild TTP lumbar paraspinals      Special Tests   Other special tests Lumbar radicular testing negative      Transfers   Transfers Independent with all Transfers    Comments required BUE for support      Ambulation/Gait   Ambulation/Gait Yes    Ambulation/Gait Assistance 6: Modified independent (Device/Increase time)    Assistive device Straight cane    Gait Comments Unsteadiness on feet      Balance   Balance Assessed Yes      Static Standing Balance   Static Standing - Balance Support No upper extremity supported    Static Standing - Level of Assistance 5: Stand by assistance   patient demonstrates increased sway and reports feeling unsteady   Static Standing Balance -  Activities  [place]berg - Eyes Opened    Static Standing - Comment/# of Minutes 30 seconds                        Objective measurements  completed on examination: See above findings.       [place] Adult PT Treatment/Exercise - 03/02/21 0001       Exercises   Exercises Lumbar      Lumbar Exercises: Stretches   Passive Hamstring Stretch 2 reps;20 seconds    Passive Hamstring Stretch Limitations seated edge of chair    Lower Trunk Rotation 3 reps;10 seconds    Piriformis Stretch 2 reps;20 seconds    Piriformis Stretch Limitations supine      Lumbar Exercises: Supine   Bridge 10 reps    Straight Leg Raise 10 reps    Straight Leg Raises Limitations partial range      Lumbar Exercises: Sidelying   Clam 10 reps    Clam Limitations yellow band                    PT Education - 03/02/21 1151     Education Details Exam findings, POC, HEP    Person(s) Educated Patient;Child(ren)    Methods Explanation;Demonstration;Tactile cues;Verbal cues;Handout    Comprehension Verbalized understanding;Returned demonstration;Verbal cues required;Tactile cues required;Need further instruction              PT Short Term Goals - 03/02/21 1153       PT SHORT TERM GOAL #1  Title Patient will be I with initial HEP to progress with PT    Time 3    Period Weeks    Status New    Target Date 03/23/21      PT SHORT TERM GOAL #2   Title PT will review FOTO with patient by 3rd visit to understand expected outcome    Time 3    Period Weeks    Status New    Target Date 03/23/21               PT Long Term Goals - 03/02/21 1154       PT LONG TERM GOAL #1   Title Patient will be I with final HEP to maintain progress from PT    Time 6    Period Weeks    Status New    Target Date 04/13/21      PT LONG TERM GOAL #2   Title Patient will report >/= 60% functional status on FOTO to indicate improve functional status    Time 6    Period Weeks    Status New    Target Date 04/13/21      PT LONG TERM GOAL #3   Title Patient will report pain grossly </= 3/10 with activity in order to reduce functional limitations     Time 6    Period Weeks    Status New    Target Date 04/13/21      PT LONG TERM GOAL #4   Title Patient will exhibit core and hip strength >/= 4/5 MMT in order to improve transfer ability without increase in pain    Time 6    Period Weeks    Status New    Target Date 04/13/21      PT LONG TERM GOAL #5   Title Patient will report ability to walk >/= 30 minutes using LRAD without sitting in order to improve community activity    Time 6    Period Weeks    Status New    Target Date 04/13/21      Additional Long Term Goals   Additional Long Term Goals Yes      PT LONG TERM GOAL #6   Title Patient will be able to get in/out of car without increase in pain or difficulty    Time 6    Period Weeks    Status New    Target Date 04/13/21                    Plan - 03/02/21 1152     Clinical Impression Statement Patient presents to PT with report of chronic low back and bilateral leg pain that have been gradually worsening and affecting his ability to perform walking and daily tasks. His does not report LE pain specific to dermatomal pattern and did not have any specific radicular pain with testing today. His current symptoms are likely due to gross mobility and strength deficits with possible lumbar stenosis referral pain. Patient currently demonstrates gross mobility deficit of the spine and flexility deficit of hips and LEs, unbale to fully assess lumbar AROM due to balance deficit but he states greater position of comfort in flexion vs extension, strength deficit of the core and hips requiring BUE to perform transfers, balance deficits with static standing and requring use of SPC due to gait unsteadiness. Patient provided exercises to begin addressing above impairments and he would benefit from continued skilled PT to progress his mobility and  strength in order to reduce pain and maximize his functional ability.    Personal Factors and Comorbidities Age;Fitness;Past/Current  Experience;Time since onset of injury/illness/exacerbation    Examination-Activity Limitations Locomotion Level;Lift;Stand;Stairs;Transfers;Bed Mobility;Bend;Carry    Examination-Participation Restrictions Cleaning;Community Activity;Driving;Shop;Laundry;Yard Work    Merchant navy officer Evolving/Moderate complexity    Clinical Decision Making Moderate    Rehab Potential Fair    PT Frequency 1x / week    PT Duration 6 weeks    PT Treatment/Interventions ADLs/Self Care Home Management;Aquatic Therapy;Cryotherapy;Electrical Stimulation;Iontophoresis 11m/ml Dexamethasone;Moist Heat;Traction;Ultrasound;Neuromuscular re-education;Balance training;Therapeutic exercise;Therapeutic activities;Functional mobility training;Stair training;Gait training;Patient/family education;Manual techniques;Dry needling;Passive range of motion;Taping;Vasopneumatic Device;Spinal Manipulations;Joint Manipulations    PT Next Visit Plan Review HEP and progress PRN, manual for lumbar mobility, stretching for lumbar/hip/hamstring, progress core and hip strengthening, balance training    PT Home Exercise Plan [email or web address]    Consulted and Agree with Plan of Care Patient;Family member/caregiver    Family Member Consulted daughter             Patient will benefit from skilled therapeutic intervention in order to improve the following deficits and impairments:  Abnormal gait, Decreased range of motion, Difficulty walking, Postural dysfunction, Decreased strength, Pain, Decreased activity tolerance, Impaired flexibility, Hypomobility, Decreased balance  Visit Diagnosis: Chronic bilateral low back pain, unspecified whether sciatica present  Muscle weakness (generalized)  Other abnormalities of gait and mobility     Problem List Patient Active Problem List   Diagnosis Date Noted   Osteoarthritis of spine with radiculopathy, lumbar region 02/02/2021   History of malignant neoplasm of bladder 12/27/2020    Bilateral tinnitus 12/27/2020   Encounter for fitting and adjustment of hearing aid 12/27/2020   Sensorineural hearing loss, bilateral 12/27/2020   OAB (overactive bladder) 12/27/2020   Allergic rhinitis 12/12/2020   Lumbar radiculopathy 10/18/2020   Lumbar degenerative disc disease 09/30/2020   Restless legs 09/30/2020   Chronic bilateral low back pain with bilateral sciatica 09/24/2020   Aortic valve disorder 08/29/2020   Bilateral leg edema 03/26/2020   Right arm pain 01/21/2019   Insomnia 07/30/2018   Diarrhea 01/28/2018   Presbycusis of both ears 04/17/2016   Hearing loss 01/24/2016   Elevated PSA 07/22/2015   Urinary tract infection, site not specified 07/21/2015   Edema 09/02/2014   Well adult exam 03/11/2014   Cramps of lower extremity 11/10/2013   Cerumen impaction 11/10/2013   Cervical disc disease 09/25/2012   Essential hypertension 06/11/2012   Bradycardia 04/17/2012   Need for prophylactic vaccination with tetanus-diphtheria (Td) 02/08/2012   Hypothyroidism 01/10/2011   TMJ SYNDROME 06/08/2010   RBBB 10/15/2009   Aortic stenosis 12/11/2008   [place] 08/27/2007   Dyslipidemia 08/27/2007   MYOCARDIAL INFARCTION, HX OF 08/27/2007   Atherosclerosis of CABG w unsp angina pectoris ([place] 08/27/2007   Coronary arteriosclerosis 08/27/2007   Cerebrovascular disease 08/27/2007   RENAL ARTERY STENOSIS 08/27/2007   Peripheral vascular disease ([place] 08/27/2007   SLEEP APNEA 08/27/2007   BPH (benign prostatic hyperplasia) 08/27/2007   CORONARY ARTERY BYPASS GRAFT, HX OF 08/27/2007   PERCUTANEOUS TRANSLUMINAL CORONARY ANGIOPLASTY, HX OF 08/27/2007    CHilda Blades PT, DPT, LAT, ATC 03/02/21  1:37 PM Phone: [phone]: 3[place]C[facility] [address]Phone: [phone]  Fax:  [phone] Name: FRAWLIN REAUMEMRN: [id number] of Birth:  [birth year]

## 2021-03-08 ENCOUNTER — Encounter: Payer: Medicare Other | Admitting: Physical Therapy

## 2021-03-09 ENCOUNTER — Telehealth: Payer: Self-pay

## 2021-03-09 NOTE — Telephone Encounter (Signed)
Duplicate request.. refill already done 03/03/21.Marland KitchenJohny Delacruz

## 2021-03-10 ENCOUNTER — Encounter: Payer: Self-pay | Admitting: Physical Therapy

## 2021-03-10 ENCOUNTER — Ambulatory Visit: Payer: Medicare Other | Admitting: Physical Therapy

## 2021-03-10 ENCOUNTER — Other Ambulatory Visit: Payer: Self-pay

## 2021-03-10 DIAGNOSIS — R2689 Other abnormalities of gait and mobility: Secondary | ICD-10-CM | POA: Diagnosis not present

## 2021-03-10 DIAGNOSIS — M6281 Muscle weakness (generalized): Secondary | ICD-10-CM

## 2021-03-10 DIAGNOSIS — G8929 Other chronic pain: Secondary | ICD-10-CM

## 2021-03-10 DIAGNOSIS — M545 Low back pain, unspecified: Secondary | ICD-10-CM

## 2021-03-10 NOTE — Patient Instructions (Signed)
Access Code: [email or web address] URL: [email or web address] Date: 03/10/2021 Prepared by: Hilda Blades  Exercises Seated Hamstring Stretch - 2-3 x daily - 7 x weekly - 2 reps - 20 hold Seated Flexion Stretch with Swiss Ball - 2-3 x daily - 7 x weekly - 2 sets - 5 reps - 5 hold Supine Lower Trunk Rotation - 2-3 x daily - 7 x weekly - 3 reps - 10 hold Supine Piriformis Stretch with Foot on Ground - 2-3 x daily - 7 x weekly - 2 reps - 20 hold Supine Bridge - 1-2 x daily - 7 x weekly - 2 sets - 10 reps Small Range Straight Leg Raise - 1-2 x daily - 7 x weekly - 2 sets - 10 reps Clamshell with Resistance - 1-2 x daily - 7 x weekly - 2 sets - 10 reps Sit to Stand Without Arm Support - 1-2 x daily - 7 x weekly - 2 sets - 5 reps

## 2021-03-11 NOTE — Therapy (Signed)
[place], [place], [address] Phone: [phone]   Fax:  [phone]  Physical Therapy Treatment  Patient Details  Name: [name] MRN: [id number] Date of Birth: [birth year] Referring Provider (PT): Plotnikov, Evie Lacks, MD   Encounter Date: 03/10/2021   PT End of Session - 03/10/21 1451     Visit Number 2    Number of Visits 6    Date for PT Re-Evaluation 04/13/21    Authorization Type BCBS MEDICARE    PT Start Time 9983    PT Stop Time 3825    PT Time Calculation (min) 45 min    Activity Tolerance Patient tolerated treatment well    Behavior During Therapy [facility] for tasks assessed/performed             Past Medical History:  Diagnosis Date   Aortic valve disorders    Echo 3/19: Mild LVH, EF 65-70, dynamic obstruction during Valsalva in mid cavity with mid cavity obliteration (peak velocity 200 cm/s, peak gradient 16 mmHg), normal wall motion, grade 1 diastolic dysfunction, AVR with mild stenosis (mean gradient 32, peak 56), MAC, mild to moderate MR, mild LAE   Atherosclerosis of renal artery ([place])    Bilateral renal artery stenosis ([place])    documented at the time of angiography by Dr. Albertine Patricia   BPH (benign prostatic hypertrophy)    Carotid artery disease ([place])    Carotid US 3/19: Bilateral ICA 40-59, follow-up 1 year   Cerebrovascular disease, unspecified    Coronary atherosclerosis of unspecified type of vessel, native or graft    History of cystoscopy    w/ bladder biopsy   History of echocardiogram    Echo 3/19: mild LVH, EF 65-70, dynamic obstruction during Valsalva with mid cavity obliteration (peak velocity of 200 cm/sec - peak gradient 16 mmHg), no RWMA, Gr 1 DD, s/p AVR, mild AS (mean 32, peak 56), MAC, mild to mod MR, mild LAE   Malignant neoplasm of bladder, part unspecified    Melanoma in situ of back ([place]) 2012   area of left scapula   Old myocardial infarction    Other and  unspecified hyperlipidemia    Peripheral vascular disease, unspecified ([place])    history of stent left lower extremity placed by Dr. Albertine Patricia   Postsurgical aortocoronary bypass status    Postsurgical percutaneous transluminal coronary angioplasty status    Scarlet fever    Unspecified sleep apnea     Past Surgical History:  Procedure Laterality Date   CORONARY ARTERY BYPASS GRAFT     CYSTOSCOPY     w/ bladder biopsy   MELANOMA EXCISION  2012   left scapular  area - clean margins.    Percutaneous transluminal coronary angioplasty     redo median sternotomy     extracorporeal circulation, aortic valve replacement using a 23-mm Mitroflow aortic pericardial heart valve.  Surgeon- Gilford Raid, MD 04-18-10    There were no vitals filed for this visit.   Subjective Assessment - 03/10/21 1449     Subjective Patient reports back has been hurting and pain down the back of both legs. He has been doing the exercises, states they are going ok.    Patient Stated Goals Imporve pain level to improve walking and getting around    Currently in Pain? Yes    Pain Score 8     Pain Location Back    Pain Orientation Lower    Pain Descriptors / Indicators Sharp;Aching;Shooting  Pain Type Chronic pain    Pain Radiating Towards back of both legs    Pain Onset More than a month ago    Pain Frequency Constant                [facility] PT Assessment - 03/11/21 0001       Sit to Stand   Comments Patient able to perform without UE assist                           [facility] Adult PT Treatment/Exercise - 03/11/21 0001       Exercises   Exercises Lumbar      Lumbar Exercises: Stretches   Passive Hamstring Stretch 2 reps;20 seconds    Passive Hamstring Stretch Limitations supine PROM    Single Knee to Chest Stretch 2 reps;20 seconds    Single Knee to Chest Stretch Limitations supine PROM    Lower Trunk Rotation 3 reps;10 seconds    Quad Stretch 2 reps;20 seconds    Quad Stretch  Limitations prone PROM    Piriformis Stretch 2 reps;20 seconds    Piriformis Stretch Limitations supine PROM      Lumbar Exercises: Aerobic   Nustep L4 x 5 min with LE only while taking subjective      Lumbar Exercises: Seated   Sit to Stand 5 reps   3 sets   Sit to Stand Limitations focus on posture and balance, then slowly lowering to the table      Lumbar Exercises: Supine   Bridge 10 reps    Straight Leg Raise 10 reps    Straight Leg Raises Limitations partial range      Lumbar Exercises: Sidelying   Clam 10 reps      Manual Therapy   Manual Therapy Joint mobilization    Joint Mobilization Lumbar CPA mobs grade III-IV to improve mobility                    PT Education - 03/10/21 1450     Education Details HEP    Person(s) Educated Patient;Spouse    Methods Explanation;Demonstration;Verbal cues    Comprehension Verbalized understanding;Returned demonstration;Verbal cues required;Need further instruction              PT Short Term Goals - 03/02/21 1153       PT SHORT TERM GOAL #1   Title Patient will be I with initial HEP to progress with PT    Time 3    Period Weeks    Status New    Target Date 03/23/21      PT SHORT TERM GOAL #2   Title PT will review FOTO with patient by 3rd visit to understand expected outcome    Time 3    Period Weeks    Status New    Target Date 03/23/21               PT Long Term Goals - 03/02/21 1154       PT LONG TERM GOAL #1   Title Patient will be I with final HEP to maintain progress from PT    Time 6    Period Weeks    Status New    Target Date 04/13/21      PT LONG TERM GOAL #2   Title Patient will report >/= 60% functional status on FOTO to indicate improve functional status    Time 6    Period  Weeks    Status New    Target Date 04/13/21      PT LONG TERM GOAL #3   Title Patient will report pain grossly </= 3/10 with activity in order to reduce functional limitations    Time 6    Period  Weeks    Status New    Target Date 04/13/21      PT LONG TERM GOAL #4   Title Patient will exhibit core and hip strength >/= 4/5 MMT in order to improve transfer ability without increase in pain    Time 6    Period Weeks    Status New    Target Date 04/13/21      PT LONG TERM GOAL #5   Title Patient will report ability to walk >/= 30 minutes using LRAD without sitting in order to improve community activity    Time 6    Period Weeks    Status New    Target Date 04/13/21      Additional Long Term Goals   Additional Long Term Goals Yes      PT LONG TERM GOAL #6   Title Patient will be able to get in/out of car without increase in pain or difficulty    Time 6    Period Weeks    Status New    Target Date 04/13/21                   Plan - 03/10/21 1451     Clinical Impression Statement Patient tolerated therapy well with no adverse effects. Therapy focused on continued mobility, stretching, and strengthening. Patient continues to demonstrate gross mobility and flexibility deficits. [employer] with good tolerance and continued with stretching, patient reporting he felt better following PT. Updated HEP to progress lumbar stretching and gross strengthening to improve control lowering to a chair. Patient would benefit from continued skilled PT to progress his mobility and strength in order to reduce pain and maximize his functional ability.    PT Treatment/Interventions ADLs/Self Care Home Management;Aquatic Therapy;Cryotherapy;Electrical Stimulation;Iontophoresis 5m/ml Dexamethasone;Moist Heat;Traction;Ultrasound;Neuromuscular re-education;Balance training;Therapeutic exercise;Therapeutic activities;Functional mobility training;Stair training;Gait training;Patient/family education;Manual techniques;Dry needling;Passive range of motion;Taping;Vasopneumatic Device;Spinal Manipulations;Joint Manipulations    PT Next Visit Plan Review HEP and progress PRN, manual for  lumbar mobility, stretching for lumbar/hip/hamstring, progress core and hip strengthening, balance training    PT Home Exercise Plan [email or web address]    Consulted and Agree with Plan of Care Patient;Family member/caregiver    Family Member Consulted spouse             Patient will benefit from skilled therapeutic intervention in order to improve the following deficits and impairments:  Abnormal gait, Decreased range of motion, Difficulty walking, Postural dysfunction, Decreased strength, Pain, Decreased activity tolerance, Impaired flexibility, Hypomobility, Decreased balance  Visit Diagnosis: Chronic bilateral low back pain, unspecified whether sciatica present  Muscle weakness (generalized)  Other abnormalities of gait and mobility     Problem List Patient Active Problem List   Diagnosis Date Noted   Osteoarthritis of spine with radiculopathy, lumbar region 02/02/2021   History of malignant neoplasm of bladder 12/27/2020   Bilateral tinnitus 12/27/2020   Encounter for fitting and adjustment of hearing aid 12/27/2020   Sensorineural hearing loss, bilateral 12/27/2020   OAB (overactive bladder) 12/27/2020   Allergic rhinitis 12/12/2020   Lumbar radiculopathy 10/18/2020   Lumbar degenerative disc disease 09/30/2020   Restless legs 09/30/2020   Chronic bilateral low back pain with bilateral sciatica 09/24/2020  Aortic valve disorder 08/29/2020   Bilateral leg edema 03/26/2020   Right arm pain 01/21/2019   Insomnia 07/30/2018   Diarrhea 01/28/2018   Presbycusis of both ears 04/17/2016   Hearing loss 01/24/2016   Elevated PSA 07/22/2015   Urinary tract infection, site not specified 07/21/2015   Edema 09/02/2014   Well adult exam 03/11/2014   Cramps of lower extremity 11/10/2013   Cerumen impaction 11/10/2013   Cervical disc disease 09/25/2012   Essential hypertension 06/11/2012   Bradycardia 04/17/2012   Need for prophylactic vaccination with tetanus-diphtheria (Td)  02/08/2012   Hypothyroidism 01/10/2011   TMJ SYNDROME 06/08/2010   RBBB 10/15/2009   Aortic stenosis 12/11/2008   Malignant neoplasm of bladder ([place]) 08/27/2007   Dyslipidemia 08/27/2007   MYOCARDIAL INFARCTION, HX OF 08/27/2007   Atherosclerosis of CABG w unsp angina pectoris ([place]) 08/27/2007   Coronary arteriosclerosis 08/27/2007   Cerebrovascular disease 08/27/2007   RENAL ARTERY STENOSIS 08/27/2007   Peripheral vascular disease ([place]) 08/27/2007   SLEEP APNEA 08/27/2007   BPH (benign prostatic hyperplasia) 08/27/2007   CORONARY ARTERY BYPASS GRAFT, HX OF 08/27/2007   PERCUTANEOUS TRANSLUMINAL CORONARY ANGIOPLASTY, HX OF 08/27/2007    Hilda Blades, PT, DPT, LAT, ATC 03/11/21  8:10 AM Phone: [phone] Fax: [place] [facility] [address] [place], [place], [address] Phone: [phone]   Fax:  [phone]  Name: [name] MRN: [id number] Date of Birth: 1928-12-28

## 2021-03-14 ENCOUNTER — Ambulatory Visit: Payer: Medicare Other | Admitting: Physical Therapy

## 2021-03-15 ENCOUNTER — Encounter: Payer: Self-pay | Admitting: Physical Therapy

## 2021-03-15 ENCOUNTER — Other Ambulatory Visit: Payer: Self-pay

## 2021-03-15 ENCOUNTER — Ambulatory Visit: Payer: Medicare Other | Admitting: Physical Therapy

## 2021-03-15 DIAGNOSIS — M6281 Muscle weakness (generalized): Secondary | ICD-10-CM

## 2021-03-15 DIAGNOSIS — G8929 Other chronic pain: Secondary | ICD-10-CM

## 2021-03-15 DIAGNOSIS — M545 Low back pain, unspecified: Secondary | ICD-10-CM

## 2021-03-15 DIAGNOSIS — R2689 Other abnormalities of gait and mobility: Secondary | ICD-10-CM

## 2021-03-15 NOTE — Patient Instructions (Signed)
Access Code: [email or web address] URL: [email or web address] Date: 03/15/2021 Prepared by: Hilda Blades  Exercises Seated Hamstring Stretch - 2-3 x daily - 7 x weekly - 2 reps - 20 hold Seated Flexion Stretch with Swiss Ball - 2-3 x daily - 7 x weekly - 2 sets - 5 reps - 5 hold Sidelying Thoracic Lumbar Rotation - 2-3 x daily - 7 x weekly - 10 reps - 2-3 hold Modified Thomas Stretch - 2-3 x daily - 7 x weekly - 3 reps - 20 hold Supine Lower Trunk Rotation - 2-3 x daily - 7 x weekly - 3 reps - 10 hold Supine Piriformis Stretch with Foot on Ground - 2-3 x daily - 7 x weekly - 2 reps - 20 hold Supine Bridge - 1-2 x daily - 7 x weekly - 2 sets - 10 reps Small Range Straight Leg Raise - 1-2 x daily - 7 x weekly - 2 sets - 10 reps Clamshell with Resistance - 1-2 x daily - 7 x weekly - 2 sets - 10 reps Sit to Stand Without Arm Support - 1-2 x daily - 7 x weekly - 2 sets - 5 reps

## 2021-03-15 NOTE — Therapy (Addendum)
[place], [place], [address] Phone: [phone]   Fax:  [phone]  Physical Therapy Treatment / Discharge  Patient Details  Name: [name] MRN: [id number] Date of Birth: 11-24-1928 Referring Provider (PT): Plotnikov, Evie Lacks, MD   Encounter Date: 03/15/2021   PT End of Session - 03/15/21 1425     Visit Number 3    Number of Visits 6    Date for PT Re-Evaluation 04/13/21    Authorization Type BCBS MEDICARE    PT Start Time 68    PT Stop Time 3557    PT Time Calculation (min) 45 min    Activity Tolerance Patient tolerated treatment well    Behavior During Therapy [facility] for tasks assessed/performed             Past Medical History:  Diagnosis Date   Aortic valve disorders    Echo 3/19: Mild LVH, EF 65-70, dynamic obstruction during Valsalva in mid cavity with mid cavity obliteration (peak velocity 200 cm/s, peak gradient 16 mmHg), normal wall motion, grade 1 diastolic dysfunction, AVR with mild stenosis (mean gradient 32, peak 56), MAC, mild to moderate MR, mild LAE   Atherosclerosis of renal artery ([place])    Bilateral renal artery stenosis ([place])    documented at the time of angiography by Dr. Albertine Patricia   BPH (benign prostatic hypertrophy)    Carotid artery disease ([place])    Carotid US 3/19: Bilateral ICA 40-59, follow-up 1 year   Cerebrovascular disease, unspecified    Coronary atherosclerosis of unspecified type of vessel, native or graft    History of cystoscopy    w/ bladder biopsy   History of echocardiogram    Echo 3/19: mild LVH, EF 65-70, dynamic obstruction during Valsalva with mid cavity obliteration (peak velocity of 200 cm/sec - peak gradient 16 mmHg), no RWMA, Gr 1 DD, s/p AVR, mild AS (mean 32, peak 56), MAC, mild to mod MR, mild LAE   Malignant neoplasm of bladder, part unspecified    Melanoma in situ of back ([place]) 2012   area of left scapula   Old myocardial infarction     Other and unspecified hyperlipidemia    Peripheral vascular disease, unspecified ([place])    history of stent left lower extremity placed by Dr. Albertine Patricia   Postsurgical aortocoronary bypass status    Postsurgical percutaneous transluminal coronary angioplasty status    Scarlet fever    Unspecified sleep apnea     Past Surgical History:  Procedure Laterality Date   CORONARY ARTERY BYPASS GRAFT     CYSTOSCOPY     w/ bladder biopsy   MELANOMA EXCISION  2012   left scapular  area - clean margins.    Percutaneous transluminal coronary angioplasty     redo median sternotomy     extracorporeal circulation, aortic valve replacement using a 23-mm Mitroflow aortic pericardial heart valve.  Surgeon- Gilford Raid, MD 04-18-10    There were no vitals filed for this visit.   Subjective Assessment - 03/15/21 1424     Subjective Patient reports he is conistent with his exercises but not seeing any changes in pain level. He does report he is feeling better today, but that yesterday he was having more pain.    Patient Stated Goals Imporve pain level to improve walking and getting around    Currently in Pain? Yes    Pain Score 7     Pain Location Back    Pain Orientation  Lower    Pain Descriptors / Indicators Aching;Sharp;Shooting    Pain Type Chronic pain    Pain Radiating Towards back of both legs    Pain Onset More than a month ago    Pain Frequency Constant    Aggravating Factors  General moving or activity, walking, changing positions                [facility] PT Assessment - 03/15/21 0001       Strength   Right Hip Extension 3/5    Right Hip ABduction 3/5    Left Hip Extension 3/5    Left Hip ABduction 3/5      Flexibility   Hamstrings Limited ~45 deg SLR bilaterally                           [facility] Adult PT Treatment/Exercise - 03/15/21 0001       Exercises   Exercises Lumbar      Lumbar Exercises: Stretches   Passive Hamstring Stretch 3 reps;20 seconds     Passive Hamstring Stretch Limitations seated edge of table    Lower Trunk Rotation 5 reps;10 seconds    Hip Flexor Stretch 2 reps;20 seconds    Hip Flexor Stretch Limitations modified thomas    Gastroc Stretch 2 reps;20 seconds    Gastroc Stretch Limitations seated with strap    Other Lumbar Stretch Exercise Seated lumbar flexion stretch with physioball 5 x 10 sec    Other Lumbar Stretch Exercise Open book stretch x 10 each      Lumbar Exercises: Aerobic   Nustep L5 x 5 min with LE only while taking subjective      Lumbar Exercises: Seated   Sit to Stand 10 reps   2 sets   Sit to Stand Limitations focus on slow controlled movement, patient reports pain at ischial tuberosity bilaterally with sitting      Lumbar Exercises: Supine   Bridge 10 reps   2 sets   Straight Leg Raise 10 reps   2 sets                   PT Education - 03/15/21 1425     Education Details HEP update, FOTO    Person(s) Educated Patient    Methods Explanation;Demonstration;Verbal cues;Handout    Comprehension Verbalized understanding;Returned demonstration;Verbal cues required;Need further instruction              PT Short Term Goals - 03/15/21 1426       PT SHORT TERM GOAL #1   Title Patient will be I with initial HEP to progress with PT    Baseline progressing with HEP    Time 3    Period Weeks    Status On-going    Target Date 03/23/21      PT SHORT TERM GOAL #2   Title PT will review FOTO with patient by 3rd visit to understand expected outcome    Baseline reviewed 3rd visit    Time 3    Period Weeks    Status Achieved    Target Date 03/23/21               PT Long Term Goals - 03/02/21 1154       PT LONG TERM GOAL #1   Title Patient will be I with final HEP to maintain progress from PT    Time 6    Period Weeks  Status New    Target Date 04/13/21      PT LONG TERM GOAL #2   Title Patient will report >/= 60% functional status on FOTO to indicate improve  functional status    Time 6    Period Weeks    Status New    Target Date 04/13/21      PT LONG TERM GOAL #3   Title Patient will report pain grossly </= 3/10 with activity in order to reduce functional limitations    Time 6    Period Weeks    Status New    Target Date 04/13/21      PT LONG TERM GOAL #4   Title Patient will exhibit core and hip strength >/= 4/5 MMT in order to improve transfer ability without increase in pain    Time 6    Period Weeks    Status New    Target Date 04/13/21      PT LONG TERM GOAL #5   Title Patient will report ability to walk >/= 30 minutes using LRAD without sitting in order to improve community activity    Time 6    Period Weeks    Status New    Target Date 04/13/21      Additional Long Term Goals   Additional Long Term Goals Yes      PT LONG TERM GOAL #6   Title Patient will be able to get in/out of car without increase in pain or difficulty    Time 6    Period Weeks    Status New    Target Date 04/13/21                   Plan - 03/15/21 1425     Clinical Impression Statement Patient tolerated therapy well with no adverse effects. He demonstrates continued deficits in general flexibility and strength but is tolerating progressions in exercises well. He continues to improve with sit<>stand ability without UE support but did report pain with sitting at ischial tuberosities. Updated HEP to progress stretching and mobility, and encouraged to continue activity level and remain conssistent with exercises. Patient would benefit from continued skilled PT to progress his mobility and strength in order to reduce pain and maximize his functional ability.    PT Treatment/Interventions ADLs/Self Care Home Management;Aquatic Therapy;Cryotherapy;Electrical Stimulation;Iontophoresis 48m/ml Dexamethasone;Moist Heat;Traction;Ultrasound;Neuromuscular re-education;Balance training;Therapeutic exercise;Therapeutic activities;Functional mobility  training;Stair training;Gait training;Patient/family education;Manual techniques;Dry needling;Passive range of motion;Taping;Vasopneumatic Device;Spinal Manipulations;Joint Manipulations    PT Next Visit Plan Review HEP and progress PRN, manual for lumbar mobility, stretching for lumbar/hip/hamstring, progress core and hip strengthening, balance training    PT Home Exercise Plan [email or web address]    Consulted and Agree with Plan of Care Patient             Patient will benefit from skilled therapeutic intervention in order to improve the following deficits and impairments:  Abnormal gait, Decreased range of motion, Difficulty walking, Postural dysfunction, Decreased strength, Pain, Decreased activity tolerance, Impaired flexibility, Hypomobility, Decreased balance  Visit Diagnosis: Chronic bilateral low back pain, unspecified whether sciatica present  Muscle weakness (generalized)  Other abnormalities of gait and mobility     Problem List Patient Active Problem List   Diagnosis Date Noted   Osteoarthritis of spine with radiculopathy, lumbar region 02/02/2021   History of malignant neoplasm of bladder 12/27/2020   Bilateral tinnitus 12/27/2020   Encounter for fitting and adjustment of hearing aid 12/27/2020   Sensorineural hearing loss, bilateral 12/27/2020  OAB (overactive bladder) 12/27/2020   Allergic rhinitis 12/12/2020   Lumbar radiculopathy 10/18/2020   Lumbar degenerative disc disease 09/30/2020   Restless legs 09/30/2020   Chronic bilateral low back pain with bilateral sciatica 09/24/2020   Aortic valve disorder 08/29/2020   Bilateral leg edema 03/26/2020   Right arm pain 01/21/2019   Insomnia 07/30/2018   Diarrhea 01/28/2018   Presbycusis of both ears 04/17/2016   Hearing loss 01/24/2016   Elevated PSA 07/22/2015   Urinary tract infection, site not specified 07/21/2015   Edema 09/02/2014   Well adult exam 03/11/2014   Cramps of lower extremity 11/10/2013   Cerumen  impaction 11/10/2013   Cervical disc disease 09/25/2012   Essential hypertension 06/11/2012   Bradycardia 04/17/2012   Need for prophylactic vaccination with tetanus-diphtheria (Td) 02/08/2012   Hypothyroidism 01/10/2011   TMJ SYNDROME 06/08/2010   RBBB 10/15/2009   Aortic stenosis 12/11/2008   Malignant neoplasm of bladder ([place]) 08/27/2007   Dyslipidemia 08/27/2007   MYOCARDIAL INFARCTION, HX OF 08/27/2007   Atherosclerosis of CABG w unsp angina pectoris ([place]) 08/27/2007   Coronary arteriosclerosis 08/27/2007   Cerebrovascular disease 08/27/2007   RENAL ARTERY STENOSIS 08/27/2007   Peripheral vascular disease ([place]) 08/27/2007   SLEEP APNEA 08/27/2007   BPH (benign prostatic hyperplasia) 08/27/2007   CORONARY ARTERY BYPASS GRAFT, HX OF 08/27/2007   PERCUTANEOUS TRANSLUMINAL CORONARY ANGIOPLASTY, HX OF 08/27/2007    Hilda Blades, PT, DPT, LAT, ATC 03/15/21  3:24 PM Phone: [phone] Fax: [facility] [address] [place], [place], [address] Phone: [phone]   Fax:  [phone]  Name: [name] MRN: [id number] Date of Birth: 1929-01-28   PHYSICAL THERAPY DISCHARGE SUMMARY  Visits from Start of Care: 3  Current functional level related to goals / functional outcomes: See above   Remaining deficits: See above   Education / Equipment: HEP   Patient agrees to discharge. Patient goals were not met. Patient is being discharged due to not returning since the last visit.  Hilda Blades, PT, DPT, LAT, ATC 04/27/21  8:46 AM Phone: [phone] Fax: [phone]

## 2021-03-31 ENCOUNTER — Encounter: Payer: Medicare Other | Admitting: Physical Therapy

## 2021-03-31 DIAGNOSIS — H5703 Miosis: Secondary | ICD-10-CM | POA: Diagnosis not present

## 2021-03-31 DIAGNOSIS — H0102A Squamous blepharitis right eye, upper and lower eyelids: Secondary | ICD-10-CM | POA: Diagnosis not present

## 2021-03-31 DIAGNOSIS — H04123 Dry eye syndrome of bilateral lacrimal glands: Secondary | ICD-10-CM | POA: Diagnosis not present

## 2021-03-31 DIAGNOSIS — H25813 Combined forms of age-related cataract, bilateral: Secondary | ICD-10-CM | POA: Diagnosis not present

## 2021-04-05 ENCOUNTER — Other Ambulatory Visit: Payer: Self-pay | Admitting: Internal Medicine

## 2021-04-05 NOTE — Telephone Encounter (Signed)
Please advise as the pts daughter has stated the pt is out of his pain meds and is in need of a refill for acetaminophen-codeine (TYLENOL #3) 300-30 MG tablet.

## 2021-04-07 ENCOUNTER — Ambulatory Visit: Payer: Medicare Other | Admitting: Physical Therapy

## 2021-04-07 ENCOUNTER — Other Ambulatory Visit: Payer: Self-pay | Admitting: Internal Medicine

## 2021-04-07 MED ORDER — ACETAMINOPHEN-CODEINE #3 300-30 MG PO TABS: 1.0000 | ORAL_TABLET | Freq: Four times a day (QID) | ORAL | 2 refills | Status: DC | PRN

## 2021-04-12 ENCOUNTER — Other Ambulatory Visit: Payer: Self-pay

## 2021-04-12 ENCOUNTER — Encounter: Payer: Self-pay | Admitting: Internal Medicine

## 2021-04-12 ENCOUNTER — Ambulatory Visit: Payer: Medicare Other | Admitting: Internal Medicine

## 2021-04-12 DIAGNOSIS — M5416 Radiculopathy, lumbar region: Secondary | ICD-10-CM | POA: Diagnosis not present

## 2021-04-12 DIAGNOSIS — M5136 Other intervertebral disc degeneration, lumbar region: Secondary | ICD-10-CM | POA: Diagnosis not present

## 2021-04-12 DIAGNOSIS — K5903 Drug induced constipation: Secondary | ICD-10-CM | POA: Diagnosis not present

## 2021-04-12 DIAGNOSIS — Z23 Encounter for immunization: Secondary | ICD-10-CM

## 2021-04-12 MED ORDER — PREDNISONE 5 MG PO TABS: 10.0000 mg | ORAL_TABLET | Freq: Every day | ORAL | 3 refills | Status: AC

## 2021-04-12 NOTE — Progress Notes (Signed)
Subjective:  Patient ID: [name], male    DOB: [birth year]  Age: 87 y.o. MRN: [id number]  CC: Follow-up (Flu shot)   HPI [name] presents for LBP - seeing Dr [name]. Pt had 2 shots in the low back. Pt had PT.  T#3 helps some, but caused severe constipation - he quit.  Pain is in the L buttock and hip - pain is 10/10 w/activity, walking, getting up with make it worse  The patient comes with his [name] who helps with history   Outpatient Medications Prior to Visit  Medication Sig Dispense Refill   amLODipine (NORVASC) 10 MG tablet Take 1 tablet (10 mg total) by mouth daily. 90 tablet 3   Ascorbic Acid (VITAMIN C PO) Take 1 capsule by mouth daily.     aspirin EC 81 MG tablet Take 1 tablet (81 mg total) by mouth daily. 90 tablet 3   B Complex-C (SUPER B COMPLEX PO) Take 1 tablet by mouth daily.     cholecalciferol (VITAMIN D) 1000 UNITS tablet Take 1,000 Units by mouth daily.     fish oil-omega-3 fatty acids 1000 MG capsule Take 1 g by mouth 2 (two) times daily.     lisinopril (ZESTRIL) 20 MG tablet Take 1 tablet (20 mg total) by mouth daily. 90 tablet 3   loperamide (IMODIUM A-D) 2 MG tablet Take 1 tablet (2 mg total) by mouth 4 (four) times daily as needed for diarrhea or loose stools. 30 tablet 2   Magnesium 400 MG TABS Take 400 mg by mouth daily.     methylPREDNISolone (MEDROL DOSEPAK) 4 MG TBPK tablet As directed 21 tablet 0   metoprolol succinate (TOPROL-XL) 25 MG 24 hr tablet Take 1 tablet (25 mg total) by mouth in the morning and at bedtime. 180 tablet 3   multivitamin (THERAGRAN) tablet Take 1 tablet by mouth daily.     nitroGLYCERIN (NITROSTAT) 0.4 MG SL tablet Place 1 tablet (0.4 mg total) under the tongue every 5 (five) minutes as needed for chest pain (Max 3 doses within 15 minutes call 911). 20 tablet 2   simvastatin (ZOCOR) 20 MG tablet Take 1 tablet (20 mg total) by mouth at bedtime. 90 tablet 3   solifenacin (VESICARE) 5 MG tablet Take 1 tablet (5 mg  total) by mouth daily. 90 tablet 3   tamsulosin (FLOMAX) 0.4 MG CAPS capsule Take 2 capsules (0.8 mg total) by mouth daily. 180 capsule 3   traZODone (DESYREL) 50 MG tablet Take 0.5-1 tablets (25-50 mg total) by mouth at bedtime as needed for sleep. 90 tablet 3   acetaminophen-codeine (TYLENOL #3) 300-30 MG tablet Take 1-2 tablets by mouth every 6 (six) hours as needed for moderate pain. 100 tablet 2   No facility-administered medications prior to visit.    ROS: Review of Systems  Constitutional:  Negative for appetite change, fatigue and unexpected weight change.  HENT:  Negative for congestion, nosebleeds, sneezing, sore throat and trouble swallowing.   Eyes:  Negative for itching and visual disturbance.  Respiratory:  Negative for cough.   Cardiovascular:  Negative for chest pain, palpitations and leg swelling.  Gastrointestinal:  Negative for abdominal distention, blood in stool, diarrhea and nausea.  Genitourinary:  Negative for frequency and hematuria.  Musculoskeletal:  Positive for back pain and gait problem. Negative for joint swelling and neck pain.  Skin:  Negative for rash.  Neurological:  Negative for dizziness, tremors, speech difficulty and weakness.  Psychiatric/Behavioral:  Negative for  agitation, dysphoric mood, sleep disturbance and suicidal ideas. The patient is not nervous/anxious.    Objective:  There were no vitals taken for this visit.  BP Readings from Last 3 Encounters:  02/22/21 (!) 142/60  02/02/21 130/60  12/27/20 (!) 150/78    Wt Readings from Last 3 Encounters:  02/22/21 126 lb 6.4 oz (57.3 kg)  02/02/21 125 lb 12.8 oz (57.1 kg)  12/27/20 128 lb (58.1 kg)    Physical Exam Constitutional:      General: He is not in acute distress.    Appearance: He is well-developed.     Comments: NAD  Eyes:     Conjunctiva/sclera: Conjunctivae normal.     Pupils: Pupils are equal, round, and reactive to light.  Neck:     Thyroid: No thyromegaly.      Vascular: No JVD.  Cardiovascular:     Rate and Rhythm: Normal rate and regular rhythm.     Heart sounds: Normal heart sounds. No murmur heard.   No friction rub. No gallop.  Pulmonary:     Effort: Pulmonary effort is normal. No respiratory distress.     Breath sounds: Normal breath sounds. No wheezing or rales.  Chest:     Chest wall: No tenderness.  Abdominal:     General: Bowel sounds are normal. There is no distension.     Palpations: Abdomen is soft. There is no mass.     Tenderness: There is no abdominal tenderness. There is no guarding or rebound.  Musculoskeletal:        General: Tenderness present. Normal range of motion.     Cervical back: Normal range of motion.  Lymphadenopathy:     Cervical: No cervical adenopathy.  Skin:    General: Skin is warm and dry.     Findings: No rash.  Neurological:     Mental Status: He is alert and oriented to person, place, and time.     Cranial Nerves: No cranial nerve deficit.     Motor: No abnormal muscle tone.     Coordination: Coordination normal.     Gait: Gait normal.     Deep Tendon Reflexes: Reflexes are normal and symmetric.  Psychiatric:        Behavior: Behavior normal.        Thought Content: Thought content normal.        Judgment: Judgment normal.    Lab Results  Component Value Date   WBC 6.7 08/12/2019   HGB 12.9 (L) 08/12/2019   HCT 38.9 (L) 08/12/2019   PLT 153.0 08/12/2019   GLUCOSE 96 08/12/2019   CHOL 172 08/12/2019   TRIG 128.0 08/12/2019   HDL 51.70 08/12/2019   LDLCALC 95 08/12/2019   ALT 14 08/12/2019   AST 21 08/12/2019   NA 141 08/12/2019   K 4.7 08/12/2019   CL 110 08/12/2019   CREATININE 1.48 08/12/2019   BUN 38 (H) 08/12/2019   CO2 25 08/12/2019   TSH 5.37 (H) 08/12/2019   PSA 2.08 08/12/2019   INR 1.49 04/18/2010   HGBA1C 5.7 03/11/2014    VAS [place] ABI WITH/WO TBI  Result Date: 01/31/2021  LOWER EXTREMITY DOPPLER STUDY Patient Name:  [name]  Date of Exam:   01/31/2021 Medical  Rec #: [id number]       Accession #:    [id number] Date of Birth: 1929-03-08        Patient Gender: M Patient Age:   89Y Exam Location:  [facility] Procedure:  VAS [place] ABI WITH/WO TBI Referring Phys: [id number] [name] --------------------------------------------------------------------------------  Indications: Peripheral artery disease. High Risk Factors: Hypertension, hyperlipidemia, no history of smoking, prior                    MI, coronary artery disease. Other Factors: Pt complaining of bilateral leg pain but worse in the left. He                states his legs hurt from the moment he wakes up til he goes to                bed at night. Denies any claudication symptoms.  Vascular Interventions: Left SFA stent and PTA of the left popliteal artery in                         07/2003. Comparison Study: 09/2017-.89 on the right and 1.05 on the left. Performing Technologist: Wilkie Aye RVT  Examination Guidelines: A complete evaluation includes at minimum, Doppler waveform signals and systolic blood pressure reading at the level of bilateral brachial, anterior tibial, and posterior tibial arteries, when vessel segments are accessible. Bilateral testing is considered an integral part of a complete examination. Photoelectric Plethysmograph (PPG) waveforms and toe systolic pressure readings are included as required and additional duplex testing as needed. Limited examinations for reoccurring indications may be performed as noted.  ABI Findings: +---------+------------------+-----+--------+--------+ Right    Rt Pressure (mmHg)IndexWaveformComment  +---------+------------------+-----+--------+--------+ Brachial 153                                     +---------+------------------+-----+--------+--------+ ATA      122               0.78 biphasic         +---------+------------------+-----+--------+--------+ PTA      118               0.75 biphasic          +---------+------------------+-----+--------+--------+ PERO     123               0.78 biphasic         +---------+------------------+-----+--------+--------+ Great Toe91                0.58 Abnormal         +---------+------------------+-----+--------+--------+ +---------+------------------+-----+---------+-------+ Left     Lt Pressure (mmHg)IndexWaveform Comment +---------+------------------+-----+---------+-------+ Brachial 157                                     +---------+------------------+-----+---------+-------+ ATA      166               1.06 triphasic        +---------+------------------+-----+---------+-------+ PTA      177               1.13 triphasic        +---------+------------------+-----+---------+-------+ PERO     175               1.11 triphasic        +---------+------------------+-----+---------+-------+ Great Toe129               0.82 Normal           +---------+------------------+-----+---------+-------+ +-------+-----------+-----------+------------+------------+ ABI/TBIToday's ABIToday's TBIPrevious ABIPrevious TBI +-------+-----------+-----------+------------+------------+ Right  0.78  0.58       0.89        0.60         +-------+-----------+-----------+------------+------------+ Left   1.13       0.82       1.05        0.82         +-------+-----------+-----------+------------+------------+  Bilateral TBIs and ABIs appear essentially unchanged compared to prior study on 09/2017.  Summary: Right: Resting right ankle-brachial index indicates moderate right lower extremity arterial disease. The right toe-brachial index is abnormal. Left: Resting left ankle-brachial index is within normal range. No evidence of significant left lower extremity arterial disease. The left toe-brachial index is normal.  *See table(s) above for measurements and observations.  Suggest follow up study in 12 months. Electronically signed by Larae Grooms MD on 01/31/2021 at 9:14:40 PM.    Final    VAS [place] LOWER EXTREMITY ARTERIAL DUPLEX  Result Date: 01/31/2021 LOWER EXTREMITY ARTERIAL DUPLEX STUDY Patient Name:  BRYLER DIBBLE  Date of Exam:   01/31/2021 Medical Rec #: [id number]       Accession #:    [id number] Date of Birth: Nov 24, 1928        Patient Gender: M Patient Age:   61Y Exam Location:  [facility] Procedure:      VAS [place] LOWER EXTREMITY ARTERIAL DUPLEX Referring Phys: Earth --------------------------------------------------------------------------------  Indications: Peripheral artery disease. High Risk Factors: Hypertension, hyperlipidemia, no history of smoking, prior                    MI, coronary artery disease. Other Factors: Pt complaining of bilateral leg pain but worse in the left. He                states his legs hurt from the moment he wakes up til he goes to                bed at night. Denies any claudication symptoms.  Vascular Interventions: Left SFA stent and PTA of the left popliteal artery in                         07/2003. Current ABI:            Right 0.78; Left 1.13 Comparison Study: 09/2017-Showed a patent left SFA stent and popliteal artery,                   s/p PTA. Performing Technologist: Wilkie Aye RVT  Examination Guidelines: A complete evaluation includes B-mode imaging, spectral Doppler, color Doppler, and power Doppler as needed of all accessible portions of each vessel. Bilateral testing is considered an integral part of a complete examination. Limited examinations for reoccurring indications may be performed as noted.   +----------+--------+-----+---------------+---------+---------+ LEFT      PSV cm/sRatioStenosis       Waveform Comments  +----------+--------+-----+---------------+---------+---------+ CFA Prox  136                         biphasic           +----------+--------+-----+---------------+---------+---------+ CFA Distal195          30-49% stenosistriphasic           +----------+--------+-----+---------------+---------+---------+ DFA       215                         triphasic          +----------+--------+-----+---------------+---------+---------+  SFA Prox  182          30-49% stenosisbiphasic turbulent +----------+--------+-----+---------------+---------+---------+ SFA Mid   92                          biphasic turbulent +----------+--------+-----+---------------+---------+---------+ POP Prox  92                          biphasic           +----------+--------+-----+---------------+---------+---------+ POP Distal62                          biphasic           +----------+--------+-----+---------------+---------+---------+ TP Trunk  107                         biphasic           +----------+--------+-----+---------------+---------+---------+  Left Stent(s): +---------------+--------+--------+--------+--------+ distal SFA     PSV cm/sStenosisWaveformComments +---------------+--------+--------+--------+--------+ Prox to Stent  66              biphasic         +---------------+--------+--------+--------+--------+ Proximal Stent 83              biphasic         +---------------+--------+--------+--------+--------+ Mid Stent      84              biphasic         +---------------+--------+--------+--------+--------+ Distal Stent   93              biphasic         +---------------+--------+--------+--------+--------+ Distal to Stent64              biphasic         +---------------+--------+--------+--------+--------+  Summary: Left: No significant change as compared to previous study. Atherosclerosis in the left lower extremity. 30-49% stenosis in the common femoral and superficial femoral artery. Patent superficial femoral artery stent. Patent popliteal artery, s/p PTA.  See table(s) above for measurements and observations. Suggest follow up study in 12 months. Electronically signed by Larae Grooms MD  on 01/31/2021 at 9:19:50 PM.    Final     Assessment & Plan:   Problem List Items Addressed This Visit     Constipation    The symptoms were aggravated by opioids.  We will discontinue Tylenol 3.  Continue with MiraLAX      Lumbar degenerative disc disease   Relevant Medications   predniSONE (DELTASONE) 5 MG tablet   Other Relevant Orders   MR Lumbar Spine Wo Contrast   Lumbar radiculopathy - Primary    D/c T#3 Worse, most likely due to spinal stenosis.  Rule out other pathology. Options to treat pain discussed.  He had some relief with Medrol DosePack.  He is not willing to take opioids.  Start Deltasone 10 mg/d pc.  Potential benefits of a long term steroid  use as well as potential risks  and complications were explained to the patient and were aknowledged.   Will get an MRI of the lumbar spine      Relevant Orders   MR Lumbar Spine Wo Contrast   Other Visit Diagnoses     Needs flu shot       Relevant Orders   Flu Vaccine QUAD High Dose(Fluad) (Completed)         Follow-up: Return in  about 2 weeks (around 04/26/2021) for a follow-up visit.  Walker Kehr, MD

## 2021-04-12 NOTE — Assessment & Plan Note (Addendum)
D/c T#3 Worse, most likely due to spinal stenosis.  Rule out other pathology. Options to treat pain discussed.  He had some relief with Medrol DosePack.  He is not willing to take opioids.  Start Deltasone 10 mg/d pc.  Potential benefits of a long term steroid  use as well as potential risks  and complications were explained to the patient and were aknowledged.   Will get an MRI of the lumbar spine

## 2021-04-14 DIAGNOSIS — K5900 Constipation, unspecified: Secondary | ICD-10-CM | POA: Insufficient documentation

## 2021-04-14 NOTE — Assessment & Plan Note (Signed)
The symptoms were aggravated by opioids.  We will discontinue Tylenol 3.  Continue with MiraLAX

## 2021-05-03 ENCOUNTER — Ambulatory Visit
Admission: RE | Admit: 2021-05-03 | Discharge: 2021-05-03 | Disposition: A | Discharge status: Home / Self Care | Payer: Medicare Other | Source: Ambulatory Visit | Attending: Internal Medicine | Admitting: Internal Medicine

## 2021-05-03 DIAGNOSIS — M545 Low back pain, unspecified: Secondary | ICD-10-CM | POA: Diagnosis not present

## 2021-05-03 DIAGNOSIS — M5416 Radiculopathy, lumbar region: Secondary | ICD-10-CM

## 2021-05-03 DIAGNOSIS — M5136 Other intervertebral disc degeneration, lumbar region: Secondary | ICD-10-CM

## 2021-05-03 DIAGNOSIS — M48061 Spinal stenosis, lumbar region without neurogenic claudication: Secondary | ICD-10-CM | POA: Diagnosis not present

## 2021-05-05 ENCOUNTER — Other Ambulatory Visit: Payer: Self-pay | Admitting: Internal Medicine

## 2021-05-05 DIAGNOSIS — M5416 Radiculopathy, lumbar region: Secondary | ICD-10-CM

## 2021-05-05 DIAGNOSIS — M5136 Other intervertebral disc degeneration, lumbar region: Secondary | ICD-10-CM

## 2021-05-05 DIAGNOSIS — M51369 Other intervertebral disc degeneration, lumbar region without mention of lumbar back pain or lower extremity pain: Secondary | ICD-10-CM

## 2021-05-09 DIAGNOSIS — M48062 Spinal stenosis, lumbar region with neurogenic claudication: Secondary | ICD-10-CM | POA: Diagnosis not present

## 2021-05-11 ENCOUNTER — Other Ambulatory Visit: Payer: Self-pay | Admitting: Neurological Surgery

## 2021-05-11 ENCOUNTER — Telehealth: Payer: Self-pay | Admitting: Internal Medicine

## 2021-05-11 NOTE — Telephone Encounter (Signed)
Patient calling to request advice on medication  Patient states he was prescribed tramadol by provider at [facility] imaging  Patient states tramadol helps w/back pain and is inquiring if he should stop taking prednisone  Patient is requesting a call back

## 2021-05-13 NOTE — Telephone Encounter (Signed)
Notified pt w/MD response.../lmb 

## 2021-05-13 NOTE — Telephone Encounter (Signed)
No.  Please continue until you see me for a follow-up appointment.  Thanks

## 2021-05-16 NOTE — Progress Notes (Signed)
The daughter Despina Arias would like to accompany her father and mother to the pre-op appointment and day of surgery to make sure all information is received and carried out appropriately. She does not feel her parents will be able to handle all of the information all at once.

## 2021-05-27 ENCOUNTER — Other Ambulatory Visit (HOSPITAL_COMMUNITY): Payer: Medicare Other

## 2021-05-31 ENCOUNTER — Encounter (HOSPITAL_COMMUNITY): Admission: RE | Payer: Self-pay | Source: Ambulatory Visit

## 2021-05-31 ENCOUNTER — Ambulatory Visit (HOSPITAL_COMMUNITY): Admission: RE | Admit: 2021-05-31 | Payer: Medicare Other | Source: Ambulatory Visit | Admitting: Neurological Surgery

## 2021-05-31 SURGERY — LUMBAR LAMINECTOMY/DECOMPRESSION MICRODISCECTOMY 1 LEVEL
Anesthesia: General | Site: Back

## 2021-07-27 ENCOUNTER — Ambulatory Visit (INDEPENDENT_AMBULATORY_CARE_PROVIDER_SITE_OTHER): Payer: Medicare Other

## 2021-07-27 ENCOUNTER — Other Ambulatory Visit: Payer: Self-pay

## 2021-07-27 VITALS — BP 120/60 | HR 66 | Temp 98.3°F | Ht 66.0 in | Wt 132.0 lb

## 2021-07-27 DIAGNOSIS — Z0000 Encounter for general adult medical examination without abnormal findings: Secondary | ICD-10-CM | POA: Diagnosis not present

## 2021-07-27 NOTE — Progress Notes (Addendum)
Subjective:   [name] is a 88 y.o. male who presents for Medicare Annual/Subsequent preventive examination.  Review of Systems     Cardiac Risk Factors include: advanced age (>57mn, >>69women);family history of premature cardiovascular disease;hypertension;dyslipidemia;male gender     Objective:    Today's Vitals   07/27/21 1344  BP: 120/60  Pulse: 66  Temp: 98.3 F (36.8 C)  SpO2: 97%  Weight: 132 lb (59.9 kg)  Height: _0  (1.676 m)  PainSc: 0-No pain   Body mass index is 21.31 kg/m.  Advanced Directives 07/27/2021 03/02/2021 03/09/2020  Does Patient Have a Medical Advance Directive? Yes Yes Yes  Type of Advance Directive Living will;[place];Healthcare Power of Attorney  Does patient want to make changes to medical advance directive? No - Patient declined - No - Patient declined  Copy of [place] Chart? No - copy requested - No - copy requested    Current Medications (verified) Outpatient Encounter Medications as of 07/27/2021  Medication Sig   traMADol (ULTRAM) 50 MG tablet take 1  tablet by oral route  every 6 hours as needed   amLODipine (NORVASC) 10 MG tablet Take 1 tablet (10 mg total) by mouth daily.   Ascorbic Acid (VITAMIN C PO) Take 1 capsule by mouth daily.   aspirin EC 81 MG tablet Take 1 tablet (81 mg total) by mouth daily.   B Complex-C (SUPER B COMPLEX PO) Take 1 tablet by mouth daily.   cholecalciferol (VITAMIN D) 1000 UNITS tablet Take 1,000 Units by mouth daily.   fish oil-omega-3 fatty acids 1000 MG capsule Take 1 g by mouth 2 (two) times daily.   lisinopril (ZESTRIL) 20 MG tablet Take 1 tablet (20 mg total) by mouth daily.   loperamide (IMODIUM A-D) 2 MG tablet Take 1 tablet (2 mg total) by mouth 4 (four) times daily as needed for diarrhea or loose stools.   Magnesium 400 MG TABS Take 400 mg by mouth daily.   methylPREDNISolone (MEDROL DOSEPAK) 4  MG TBPK tablet As directed   metoprolol succinate (TOPROL-XL) 25 MG 24 hr tablet Take 1 tablet (25 mg total) by mouth in the morning and at bedtime.   multivitamin (THERAGRAN) tablet Take 1 tablet by mouth daily.   nitroGLYCERIN (NITROSTAT) 0.4 MG SL tablet Place 1 tablet (0.4 mg total) under the tongue every 5 (five) minutes as needed for chest pain (Max 3 doses within 15 minutes call 911).   predniSONE (DELTASONE) 5 MG tablet Take 2 tablets (10 mg total) by mouth daily with breakfast.   simvastatin (ZOCOR) 20 MG tablet Take 1 tablet (20 mg total) by mouth at bedtime.   solifenacin (VESICARE) 5 MG tablet Take 1 tablet (5 mg total) by mouth daily.   tamsulosin (FLOMAX) 0.4 MG CAPS capsule Take 2 capsules (0.8 mg total) by mouth daily.   traZODone (DESYREL) 50 MG tablet Take 0.5-1 tablets (25-50 mg total) by mouth at bedtime as needed for sleep.   No facility-administered encounter medications on file as of 07/27/2021.    Allergies (verified) Sulfonamide derivatives, Codeine, Lactose, and Sulfa antibiotics   History: Past Medical History:  Diagnosis Date   Aortic valve disorders    Echo 3/19: Mild LVH, EF 65-70, dynamic obstruction during Valsalva in mid cavity with mid cavity obliteration (peak velocity 200 cm/s, peak gradient 16 mmHg), normal wall motion, grade 1 diastolic dysfunction, AVR with mild stenosis (mean gradient 32,  peak 56), MAC, mild to moderate MR, mild LAE   Atherosclerosis of renal artery (HCC)    Bilateral renal artery stenosis (HCC)    documented at the time of angiography by Dr. Albertine Patricia   BPH (benign prostatic hypertrophy)    Carotid artery disease ([place])    Carotid US 3/19: Bilateral ICA 40-59, follow-up 1 year   Cerebrovascular disease, unspecified    Coronary atherosclerosis of unspecified type of vessel, native or graft    History of cystoscopy    w/ bladder biopsy   History of echocardiogram    Echo 3/19: mild LVH, EF 65-70, dynamic obstruction during Valsalva  with mid cavity obliteration (peak velocity of 200 cm/sec - peak gradient 16 mmHg), no RWMA, Gr 1 DD, s/p AVR, mild AS (mean 32, peak 56), MAC, mild to mod MR, mild LAE   Malignant neoplasm of bladder, part unspecified    Melanoma in situ of back ([place]) 2012   area of left scapula   Old myocardial infarction    Other and unspecified hyperlipidemia    Peripheral vascular disease, unspecified ([place])    history of stent left lower extremity placed by Dr. Albertine Patricia   Postsurgical aortocoronary bypass status    Postsurgical percutaneous transluminal coronary angioplasty status    Scarlet fever    Unspecified sleep apnea    Past Surgical History:  Procedure Laterality Date   CORONARY ARTERY BYPASS GRAFT     CYSTOSCOPY     w/ bladder biopsy   MELANOMA EXCISION  2012   left scapular  area - clean margins.    Percutaneous transluminal coronary angioplasty     redo median sternotomy     extracorporeal circulation, aortic valve replacement using a 23-mm Mitroflow aortic pericardial heart valve.  Surgeon- Gilford Raid, MD 04-18-10   Family History  Problem Relation Age of Onset   Hypertension Mother    Diabetes Mother    Hyperlipidemia Mother    Coronary artery disease Mother    Heart disease Mother        CAD, cardiomyopathy   Stroke Mother    Coronary artery disease Father    Sudden death Father        1 wk post MI   Heart attack Father    Heart disease Father        CAD/MI-sudden death   Cancer Other        prostate   Birth defects Neg Hx    Social History   Socioeconomic History   Marital status: Married    Spouse name: Not on file   Number of children: 3   Years of education: 69   Highest education level: Not on file  Occupational History   Occupation: Lobbyist: RETIRED   Occupation: retired  Tobacco Use   Smoking status: Never   Smokeless tobacco: Never  Scientific laboratory technician Use: Never used  Substance and Sexual Activity   Alcohol use: Yes    Comment: rare    Drug use: No   Sexual activity: Not Currently  Other Topics Concern   Not on file  Social History Narrative   HSG, [employer], did some graduate work. Married '52. 1 son - '61; 2 daughters -'35, '63: grandchildren 29; great-grands 61. 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 Active gardner, Designer, jewellery. good marriage.   Social Determinants of Health   Financial Resource Strain: Low Risk    Difficulty of Paying Living Expenses: Not hard at all  Food Insecurity: No Food Insecurity   Worried About Charity fundraiser in the Last Year: Never true   Ran Out of Food in the Last Year: Never true  Transportation Needs: No Transportation Needs   Lack of Transportation (Medical): No   Lack of Transportation (Non-Medical): No  Physical Activity: Sufficiently Active   Days of Exercise per Week: 5 days   Minutes of Exercise per Session: 30 min  Stress: No Stress Concern Present   Feeling of Stress : Not at all  Social Connections: Socially Integrated   Frequency of Communication with Friends and Family: More than three times a week   Frequency of Social Gatherings with Friends and Family: More than three times a week   Attends Religious Services: More than 4 times per year   Active Member of [employer] or Organizations: Yes   Attends Music therapist: More than 4 times per year   Marital Status: Married    Tobacco Counseling Counseling given: Not Answered   Clinical Intake:  Pre-visit preparation completed: Yes  Pain : No/denies pain Pain Score: 0-No pain     BMI - recorded: 21.31 Nutritional Status: BMI of 19-24  Normal Nutritional Risks: None Diabetes: No  How often do you need to have someone help you when you read instructions, pamphlets, or other written materials from your doctor or pharmacy?: 1 - Never What is the last grade level you completed in school?: Bachelor's Degree  Diabetic? no  Interpreter Needed?: No  Information entered by ::  Lisette Abu, LPN   Activities of Daily Living In your present state of health, do you have any difficulty performing the following activities: 07/27/2021  Hearing? N  Vision? N  Difficulty concentrating or making decisions? N  Walking or climbing stairs? N  Dressing or bathing? N  Doing errands, shopping? N  Preparing Food and eating ? N  Using the Toilet? N  In the past six months, have you accidently leaked urine? N  Do you have problems with loss of bowel control? N  Managing your Medications? N  Managing your Finances? N  Housekeeping or managing your Housekeeping? N  Some recent data might be hidden    Patient Care Team: Plotnikov, Evie Lacks, MD as PCP - General (Internal Medicine) Sherren Mocha, MD as PCP - Cardiology (Cardiology) Gaye Pollack, MD (Cardiothoracic Surgery) Festus Aloe, MD as Consulting Physician (Urology) Reece Agar, MD as Consulting Physician (Pain Medicine)  Indicate any recent Medical Services you may have received from other than [facility] providers in the past year (date may be approximate).     Assessment:   This is a routine wellness examination for [place].  Hearing/Vision screen Hearing Screening - Comments:: Patient wears hearing aids. Vision Screening - Comments:: Patient wears corrective lenses/glasses.  Annual eye exam done by Dr. Clent Jacks.  Dietary issues and exercise activities discussed: Current Exercise Habits: Home exercise routine, Type of exercise: walking;Other - see comments (yard work), Time (Minutes): 30, Frequency (Times/Week): 5, Weekly Exercise (Minutes/Week): 150, Intensity: Mild, Exercise limited by: orthopedic condition(s)   Goals Addressed               This Visit's Progress     Patient Stated (pt-stated)        My goal is to stay alive, active and independent.      Depression Screen PHQ 2/9 Scores 07/27/2021 04/12/2021 03/09/2020 07/30/2018 07/27/2017 01/24/2016 08/24/2014  PHQ - 2 Score 0 0 0 0 0 0  0  PHQ- 9 Score - 3 - - - - -    Fall Risk Fall Risk  07/27/2021 04/12/2021 03/09/2020 03/09/2020 07/30/2018  Falls in the past year? 1 0 - 0 0  Number falls in past yr: 0 0 - 0 -  Injury with Fall? 0 0 - 0 -  Risk for fall due to : - No Fall Risks No Fall Risks No Fall Risks -  Follow up Falls evaluation completed - Falls evaluation completed;Education provided Falls evaluation completed;Education provided Falls evaluation completed    [place]:  Any stairs in or around the home? Yes  If so, are there any without handrails? No  Home free of loose throw rugs in walkways, pet beds, electrical cords, etc? Yes  Adequate lighting in your home to reduce risk of falls? Yes   ASSISTIVE DEVICES UTILIZED TO PREVENT FALLS:  Life alert? No  Use of a cane, walker or w/c? No  Grab bars in the bathroom? No  Shower chair or bench in shower? No  Elevated toilet seat or a handicapped toilet? No   TIMED UP AND GO:  Was the test performed? Yes .  Length of time to ambulate 10 feet: 10 sec.   Gait slow and steady without use of assistive device  Cognitive Function:Normal cognitive status assessed by direct observation by this Nurse Health Advisor. No abnormalities found.       6CIT Screen 03/09/2020  What Year? 0 points  What month? 0 points  What time? 0 points  Count back from 20 0 points  Months in reverse 0 points  Repeat phrase 0 points  Total Score 0    Immunizations Immunization History  Administered Date(s) Administered   Fluad Quad(high Dose 65+) 04/21/2019, 05/13/2020, 04/12/2021   Influenza Split 05/31/2011, 05/09/2012   Influenza Whole 05/04/2008, 04/14/2009, 04/07/2010   Influenza, High Dose Seasonal PF 04/06/2016, 04/25/2017, 05/08/2018   Influenza,inj,Quad PF,6+ Mos 04/22/2013, 03/11/2014, 03/25/2015   Influenza-Unspecified 04/17/2019   PFIZER(Purple Top)SARS-COV-2 Vaccination 08/09/2019, 08/30/2019, 04/22/2020   Pneumococcal  Conjugate-13 03/11/2014   Pneumococcal Polysaccharide-23 05/12/2003, 07/21/2015   Td 02/08/2012   Zoster, Live 07/21/2011    TDAP status: Up to date  Flu Vaccine status: Up to date  Pneumococcal vaccine status: Up to date  Covid-19 vaccine status: Completed vaccines  Qualifies for Shingles Vaccine? Yes   Zostavax completed Yes   Shingrix Completed?: No.    Education has been provided regarding the importance of this vaccine. Patient has been advised to call insurance company to determine out of pocket expense if they have not yet received this vaccine. Advised may also receive vaccine at local pharmacy or Health Dept. Verbalized acceptance and understanding.  Screening Tests Health Maintenance  Topic Date Due   Zoster Vaccines- Shingrix (1 of 2) Never done   COVID-19 Vaccine (4 - Booster for Pfizer series) 06/17/2020   TETANUS/TDAP  02/07/2022   Pneumonia Vaccine 3+ Years old  Completed   INFLUENZA VACCINE  Completed   HPV VACCINES  Aged Out    Health Maintenance  Health Maintenance Due  Topic Date Due   Zoster Vaccines- Shingrix (1 of 2) Never done   COVID-19 Vaccine (4 - Booster for Pfizer series) 06/17/2020    Colorectal cancer screening: No longer required.   Lung Cancer Screening: (Low Dose CT Chest recommended if Age 66-80 years, 30 pack-year currently smoking OR have quit w/in 15years.) does not qualify.   Lung Cancer Screening Referral: no  Additional Screening:  Hepatitis C Screening: does not qualify; Completed no  Vision Screening: Recommended annual ophthalmology exams for early detection of glaucoma and other disorders of the eye. Is the patient up to date with their annual eye exam?  Yes  Who is the provider or what is the name of the office in which the patient attends annual eye exams? Clent Jacks, MD. If pt is not established with a provider, would they like to be referred to a provider to establish care? No .   Dental Screening: Recommended  annual dental exams for proper oral hygiene  Community Resource Referral / Chronic Care Management: CRR required this visit?  No   CCM required this visit?  No      Plan:     I have personally reviewed and noted the following in the patients chart:   Medical and social history Use of alcohol, tobacco or illicit drugs  Current medications and supplements including opioid prescriptions. Patient is not currently taking opioid prescriptions. Functional ability and status Nutritional status Physical activity Advanced directives List of other physicians Hospitalizations, surgeries, and ER visits in previous 12 months Vitals Screenings to include cognitive, depression, and falls Referrals and appointments  In addition, I have reviewed and discussed with patient certain preventive protocols, quality metrics, and best practice recommendations. A written personalized care plan for preventive services as well as general preventive health recommendations were provided to patient.     Sheral Flow, LPN   7/89/3810   Nurse Notes:  Hearing Screening - Comments:: Patient wears hearing aids. Vision Screening - Comments:: Patient wears corrective lenses/glasses.  Annual eye exam done by Dr. Clent Jacks.  Medical screening examination/treatment/procedure(s) were performed by non-physician practitioner and as supervising physician I was immediately available for consultation/collaboration.  I agree with above. Lew Dawes, MD

## 2021-07-27 NOTE — Patient Instructions (Signed)
[name] , Thank you for taking time to come for your Medicare Wellness Visit. I appreciate your ongoing commitment to your health goals. Please review the following plan we discussed and let me know if I can assist you in the future.   Screening recommendations/referrals: Colonoscopy: Not a candidate for screening due to age Recommended yearly ophthalmology/optometry visit for glaucoma screening and checkup Recommended yearly dental visit for hygiene and checkup  Vaccinations: Influenza vaccine: 04/12/2021 Pneumococcal vaccine: 03/11/2014, 07/21/2015 Tdap vaccine: 02/08/2012; due every 10 years (due 02/07/2022) Shingles vaccine: no record   Covid-19: 08/09/2019, 08/30/2019, 04/22/2020  Advanced directives: Please bring a copy of your health care power of attorney and living will to the office at your convenience.  Conditions/risks identified: My goal is to stay alive, active and independent.  Next appointment: Please schedule your next Medicare Wellness Visit with your Nurse Health Advisor in 1 year by calling [phone].  Preventive Care 40 Years and Older, Male Preventive care refers to lifestyle choices and visits with your health care provider that can promote health and wellness. What does preventive care include? A yearly physical exam. This is also called an annual well check. Dental exams once or twice a year. Routine eye exams. Ask your health care provider how often you should have your eyes checked. Personal lifestyle choices, including: Daily care of your teeth and gums. Regular physical activity. Eating a healthy diet. Avoiding tobacco and drug use. Limiting alcohol use. Practicing safe sex. Taking low doses of aspirin every day. Taking vitamin and mineral supplements as recommended by your health care provider. What happens during an annual well check? The services and screenings done by your health care provider during your annual well check will depend on your age,  overall health, lifestyle risk factors, and family history of disease. Counseling  Your health care provider may ask you questions about your: Alcohol use. Tobacco use. Drug use. Emotional well-being. Home and relationship well-being. Sexual activity. Eating habits. History of falls. Memory and ability to understand (cognition). Work and work Statistician. Screening  You may have the following tests or measurements: Height, weight, and BMI. Blood pressure. Lipid and cholesterol levels. These may be checked every 5 years, or more frequently if you are over 63 years old. Skin check. Lung cancer screening. You may have this screening every year starting at age 4 if you have a 30-pack-year history of smoking and currently smoke or have quit within the past 15 years. Fecal occult blood test (FOBT) of the stool. You may have this test every year starting at age 25. Flexible sigmoidoscopy or colonoscopy. You may have a sigmoidoscopy every 5 years or a colonoscopy every 10 years starting at age 52. Prostate cancer screening. Recommendations will vary depending on your family history and other risks. Hepatitis C blood test. Hepatitis B blood test. Sexually transmitted disease (STD) testing. Diabetes screening. This is done by checking your blood sugar (glucose) after you have not eaten for a while (fasting). You may have this done every 1-3 years. Abdominal aortic aneurysm (AAA) screening. You may need this if you are a current or former smoker. Osteoporosis. You may be screened starting at age 59 if you are at high risk. Talk with your health care provider about your test results, treatment options, and if necessary, the need for more tests. Vaccines  Your health care provider may recommend certain vaccines, such as: Influenza vaccine. This is recommended every year. Tetanus, diphtheria, and acellular pertussis (Tdap, Td) vaccine. You may  need a Td booster every 10 years. Zoster vaccine.  You may need this after age 31. Pneumococcal 13-valent conjugate (PCV13) vaccine. One dose is recommended after age 62. Pneumococcal polysaccharide (PPSV23) vaccine. One dose is recommended after age 16. Talk to your health care provider about which screenings and vaccines you need and how often you need them. This information is not intended to replace advice given to you by your health care provider. Make sure you discuss any questions you have with your health care provider. Document Released: 07/30/2015 Document Revised: 03/22/2016 Document Reviewed: 05/04/2015 Elsevier Interactive Patient Education  2017 [place] Prevention in the Home Falls can cause injuries. They can happen to people of all ages. There are many things you can do to make your home safe and to help prevent falls. What can I do on the outside of my home? Regularly fix the edges of walkways and driveways and fix any cracks. Remove anything that might make you trip as you walk through a door, such as a raised step or threshold. Trim any bushes or trees on the path to your home. Use bright outdoor lighting. Clear any walking paths of anything that might make someone trip, such as rocks or tools. Regularly check to see if handrails are loose or broken. Make sure that both sides of any steps have handrails. Any raised decks and porches should have guardrails on the edges. Have any leaves, snow, or ice cleared regularly. Use sand or salt on walking paths during winter. Clean up any spills in your garage right away. This includes oil or grease spills. What can I do in the bathroom? Use night lights. Install grab bars by the toilet and in the tub and shower. Do not use towel bars as grab bars. Use non-skid mats or decals in the tub or shower. If you need to sit down in the shower, use a plastic, non-slip stool. Keep the floor dry. Clean up any water that spills on the floor as soon as it happens. Remove soap  buildup in the tub or shower regularly. Attach bath mats securely with double-sided non-slip rug tape. Do not have throw rugs and other things on the floor that can make you trip. What can I do in the bedroom? Use night lights. Make sure that you have a light by your bed that is easy to reach. Do not use any sheets or blankets that are too big for your bed. They should not hang down onto the floor. Have a firm chair that has side arms. You can use this for support while you get dressed. Do not have throw rugs and other things on the floor that can make you trip. What can I do in the kitchen? Clean up any spills right away. Avoid walking on wet floors. Keep items that you use a lot in easy-to-reach places. If you need to reach something above you, use a strong step stool that has a grab bar. Keep electrical cords out of the way. Do not use floor polish or wax that makes floors slippery. If you must use wax, use non-skid floor wax. Do not have throw rugs and other things on the floor that can make you trip. What can I do with my stairs? Do not leave any items on the stairs. Make sure that there are handrails on both sides of the stairs and use them. Fix handrails that are broken or loose. Make sure that handrails are as long as the stairways.  Check any carpeting to make sure that it is firmly attached to the stairs. Fix any carpet that is loose or worn. Avoid having throw rugs at the top or bottom of the stairs. If you do have throw rugs, attach them to the floor with carpet tape. Make sure that you have a light switch at the top of the stairs and the bottom of the stairs. If you do not have them, ask someone to add them for you. What else can I do to help prevent falls? Wear shoes that: Do not have high heels. Have rubber bottoms. Are comfortable and fit you well. Are closed at the toe. Do not wear sandals. If you use a stepladder: Make sure that it is fully opened. Do not climb a closed  stepladder. Make sure that both sides of the stepladder are locked into place. Ask someone to hold it for you, if possible. Clearly mark and make sure that you can see: Any grab bars or handrails. First and last steps. Where the edge of each step is. Use tools that help you move around (mobility aids) if they are needed. These include: Canes. Walkers. Scooters. Crutches. Turn on the lights when you go into a dark area. Replace any light bulbs as soon as they burn out. Set up your furniture so you have a clear path. Avoid moving your furniture around. If any of your floors are uneven, fix them. If there are any pets around you, be aware of where they are. Review your medicines with your doctor. Some medicines can make you feel dizzy. This can increase your chance of falling. Ask your doctor what other things that you can do to help prevent falls. This information is not intended to replace advice given to you by your health care provider. Make sure you discuss any questions you have with your health care provider. Document Released: 04/29/2009 Document Revised: 12/09/2015 Document Reviewed: 08/07/2014 Elsevier Interactive Patient Education  2017 [employer].

## 2021-07-30 ENCOUNTER — Other Ambulatory Visit: Payer: Self-pay | Admitting: Internal Medicine

## 2021-07-30 DIAGNOSIS — I25709 Atherosclerosis of coronary artery bypass graft(s), unspecified, with unspecified angina pectoris: Secondary | ICD-10-CM

## 2021-07-30 DIAGNOSIS — I10 Essential (primary) hypertension: Secondary | ICD-10-CM

## 2021-07-30 DIAGNOSIS — I359 Nonrheumatic aortic valve disorder, unspecified: Secondary | ICD-10-CM

## 2021-08-22 ENCOUNTER — Telehealth: Payer: Self-pay | Admitting: Cardiovascular Disease

## 2021-08-22 NOTE — Telephone Encounter (Signed)
Called and spoke with patient who called in about chest tightness.   Patient reports he has been experiencing chest tightness for the last 2 weeks with pain in right arm, legs and buttocks. He states this also causes him to have more difficulty breathing. Patient denies chest pain/tightness or SOB at time of call, states these both "come and go." Patient reports experiencing chest tightness at rest and that it improves with walking. Patient denies any dizziness or nausea. Patient has nitroglycerin but has not taken any.  Patient also reports having chronic back pain, for which he uses Voltaren gel. He states his back pain has also been worsening over the last year and he is considering surgery.  Will send message to Dr. Burt Knack for review.  Discussed ED precautions with patient.  Patient verbalized understanding and agrees with plan above.

## 2021-08-22 NOTE — Telephone Encounter (Signed)
Called and spoke with patient, per Dr. Burt Knack patient to see DOD at next available opening. Patient scheduled to see Dr. Ali Lowe on 08/26/2021 at 1:30PM for chest pain/tightness x2 weeks.  Patient verbalized understand and agrees with plan above.

## 2021-08-22 NOTE — Telephone Encounter (Signed)
Pt c/o of Chest Pain: STAT if CP now or developed within 24 hours  1. Are you having CP right now?  No   2. Are you experiencing any other symptoms (ex. SOB, nausea, vomiting, sweating)?  Chest tightness, SOB, Pain in arms, legs, and buttocks  3. How long have you been experiencing CP?  About 1 year, becoming progressively worse. Causes trouble with sleeping and walking   4. Is your CP continuous or coming and going?  Coming and going   5. Have you taken Nitroglycerin?  No  ?

## 2021-08-23 ENCOUNTER — Inpatient Hospital Stay (HOSPITAL_COMMUNITY): Payer: Medicare Other

## 2021-08-23 ENCOUNTER — Encounter (HOSPITAL_COMMUNITY): Payer: Self-pay

## 2021-08-23 ENCOUNTER — Emergency Department (HOSPITAL_COMMUNITY): Payer: Medicare Other

## 2021-08-23 ENCOUNTER — Other Ambulatory Visit: Payer: Self-pay

## 2021-08-23 DIAGNOSIS — Z86006 Personal history of melanoma in-situ: Secondary | ICD-10-CM

## 2021-08-23 DIAGNOSIS — Z888 Allergy status to other drugs, medicaments and biological substances status: Secondary | ICD-10-CM | POA: Diagnosis not present

## 2021-08-23 DIAGNOSIS — Z951 Presence of aortocoronary bypass graft: Secondary | ICD-10-CM | POA: Diagnosis not present

## 2021-08-23 DIAGNOSIS — Z8551 Personal history of malignant neoplasm of bladder: Secondary | ICD-10-CM | POA: Diagnosis not present

## 2021-08-23 DIAGNOSIS — Z882 Allergy status to sulfonamides status: Secondary | ICD-10-CM

## 2021-08-23 DIAGNOSIS — E875 Hyperkalemia: Secondary | ICD-10-CM | POA: Diagnosis present

## 2021-08-23 DIAGNOSIS — Z885 Allergy status to narcotic agent status: Secondary | ICD-10-CM

## 2021-08-23 DIAGNOSIS — I442 Atrioventricular block, complete: Secondary | ICD-10-CM | POA: Diagnosis not present

## 2021-08-23 DIAGNOSIS — E872 Acidosis, unspecified: Secondary | ICD-10-CM | POA: Diagnosis not present

## 2021-08-23 DIAGNOSIS — I214 Non-ST elevation (NSTEMI) myocardial infarction: Secondary | ICD-10-CM | POA: Diagnosis not present

## 2021-08-23 DIAGNOSIS — Z955 Presence of coronary angioplasty implant and graft: Secondary | ICD-10-CM | POA: Diagnosis not present

## 2021-08-23 DIAGNOSIS — I5043 Acute on chronic combined systolic (congestive) and diastolic (congestive) heart failure: Secondary | ICD-10-CM | POA: Diagnosis not present

## 2021-08-23 DIAGNOSIS — R0902 Hypoxemia: Secondary | ICD-10-CM | POA: Diagnosis not present

## 2021-08-23 DIAGNOSIS — R570 Cardiogenic shock: Secondary | ICD-10-CM | POA: Diagnosis not present

## 2021-08-23 DIAGNOSIS — I5041 Acute combined systolic (congestive) and diastolic (congestive) heart failure: Secondary | ICD-10-CM | POA: Diagnosis not present

## 2021-08-23 DIAGNOSIS — Z79899 Other long term (current) drug therapy: Secondary | ICD-10-CM

## 2021-08-23 DIAGNOSIS — E039 Hypothyroidism, unspecified: Secondary | ICD-10-CM | POA: Diagnosis not present

## 2021-08-23 DIAGNOSIS — G8929 Other chronic pain: Secondary | ICD-10-CM | POA: Diagnosis present

## 2021-08-23 DIAGNOSIS — Z7982 Long term (current) use of aspirin: Secondary | ICD-10-CM

## 2021-08-23 DIAGNOSIS — Z452 Encounter for adjustment and management of vascular access device: Secondary | ICD-10-CM

## 2021-08-23 DIAGNOSIS — J9601 Acute respiratory failure with hypoxia: Secondary | ICD-10-CM | POA: Diagnosis not present

## 2021-08-23 DIAGNOSIS — J90 Pleural effusion, not elsewhere classified: Secondary | ICD-10-CM | POA: Diagnosis not present

## 2021-08-23 DIAGNOSIS — R0602 Shortness of breath: Secondary | ICD-10-CM | POA: Diagnosis not present

## 2021-08-23 DIAGNOSIS — Z823 Family history of stroke: Secondary | ICD-10-CM

## 2021-08-23 DIAGNOSIS — Z953 Presence of xenogenic heart valve: Secondary | ICD-10-CM

## 2021-08-23 DIAGNOSIS — I252 Old myocardial infarction: Secondary | ICD-10-CM

## 2021-08-23 DIAGNOSIS — I080 Rheumatic disorders of both mitral and aortic valves: Secondary | ICD-10-CM | POA: Diagnosis present

## 2021-08-23 DIAGNOSIS — J9811 Atelectasis: Secondary | ICD-10-CM | POA: Diagnosis not present

## 2021-08-23 DIAGNOSIS — I701 Atherosclerosis of renal artery: Secondary | ICD-10-CM | POA: Diagnosis present

## 2021-08-23 DIAGNOSIS — Z515 Encounter for palliative care: Secondary | ICD-10-CM | POA: Diagnosis not present

## 2021-08-23 DIAGNOSIS — I5033 Acute on chronic diastolic (congestive) heart failure: Secondary | ICD-10-CM

## 2021-08-23 DIAGNOSIS — G4733 Obstructive sleep apnea (adult) (pediatric): Secondary | ICD-10-CM | POA: Diagnosis present

## 2021-08-23 DIAGNOSIS — I2510 Atherosclerotic heart disease of native coronary artery without angina pectoris: Secondary | ICD-10-CM | POA: Diagnosis present

## 2021-08-23 DIAGNOSIS — N289 Disorder of kidney and ureter, unspecified: Secondary | ICD-10-CM

## 2021-08-23 DIAGNOSIS — R0689 Other abnormalities of breathing: Secondary | ICD-10-CM | POA: Diagnosis not present

## 2021-08-23 DIAGNOSIS — I509 Heart failure, unspecified: Secondary | ICD-10-CM

## 2021-08-23 DIAGNOSIS — I10 Essential (primary) hypertension: Secondary | ICD-10-CM | POA: Diagnosis present

## 2021-08-23 DIAGNOSIS — J810 Acute pulmonary edema: Secondary | ICD-10-CM

## 2021-08-23 DIAGNOSIS — Z66 Do not resuscitate: Secondary | ICD-10-CM | POA: Diagnosis present

## 2021-08-23 DIAGNOSIS — I739 Peripheral vascular disease, unspecified: Secondary | ICD-10-CM | POA: Diagnosis not present

## 2021-08-23 DIAGNOSIS — Z83438 Family history of other disorder of lipoprotein metabolism and other lipidemia: Secondary | ICD-10-CM

## 2021-08-23 DIAGNOSIS — Z8249 Family history of ischemic heart disease and other diseases of the circulatory system: Secondary | ICD-10-CM

## 2021-08-23 DIAGNOSIS — M4726 Other spondylosis with radiculopathy, lumbar region: Secondary | ICD-10-CM | POA: Diagnosis present

## 2021-08-23 DIAGNOSIS — N400 Enlarged prostate without lower urinary tract symptoms: Secondary | ICD-10-CM | POA: Diagnosis present

## 2021-08-23 DIAGNOSIS — I130 Hypertensive heart and chronic kidney disease with heart failure and stage 1 through stage 4 chronic kidney disease, or unspecified chronic kidney disease: Secondary | ICD-10-CM | POA: Diagnosis not present

## 2021-08-23 DIAGNOSIS — N179 Acute kidney failure, unspecified: Secondary | ICD-10-CM | POA: Diagnosis not present

## 2021-08-23 DIAGNOSIS — N1832 Chronic kidney disease, stage 3b: Secondary | ICD-10-CM | POA: Diagnosis present

## 2021-08-23 DIAGNOSIS — I340 Nonrheumatic mitral (valve) insufficiency: Secondary | ICD-10-CM | POA: Diagnosis not present

## 2021-08-23 DIAGNOSIS — Z20822 Contact with and (suspected) exposure to covid-19: Secondary | ICD-10-CM | POA: Diagnosis not present

## 2021-08-23 DIAGNOSIS — E785 Hyperlipidemia, unspecified: Secondary | ICD-10-CM | POA: Diagnosis present

## 2021-08-23 DIAGNOSIS — R079 Chest pain, unspecified: Secondary | ICD-10-CM | POA: Diagnosis not present

## 2021-08-23 DIAGNOSIS — M549 Dorsalgia, unspecified: Secondary | ICD-10-CM | POA: Diagnosis present

## 2021-08-23 DIAGNOSIS — J811 Chronic pulmonary edema: Secondary | ICD-10-CM | POA: Diagnosis not present

## 2021-08-23 DIAGNOSIS — C679 Malignant neoplasm of bladder, unspecified: Secondary | ICD-10-CM | POA: Diagnosis present

## 2021-08-23 DIAGNOSIS — R0789 Other chest pain: Secondary | ICD-10-CM | POA: Diagnosis not present

## 2021-08-23 LAB — I-STAT CHEM 8, ED
BUN: 59 mg/dL — ABNORMAL HIGH (ref 8–23)
Calcium, Ion: 1.14 mmol/L — ABNORMAL LOW (ref 1.15–1.40)
Chloride: 113 mmol/L — ABNORMAL HIGH (ref 98–111)
Creatinine, Ser: 3.6 mg/dL — ABNORMAL HIGH (ref 0.61–1.24)
Glucose, Bld: 148 mg/dL — ABNORMAL HIGH (ref 70–99)
HCT: 29 % — ABNORMAL LOW (ref 39.0–52.0)
Hemoglobin: 9.9 g/dL — ABNORMAL LOW (ref 13.0–17.0)
Potassium: 5.2 mmol/L — ABNORMAL HIGH (ref 3.5–5.1)
Sodium: 139 mmol/L (ref 135–145)
TCO2: 20 mmol/L — ABNORMAL LOW (ref 22–32)

## 2021-08-23 LAB — BASIC METABOLIC PANEL
Anion gap: 15 (ref 5–15)
BUN: 65 mg/dL — ABNORMAL HIGH (ref 8–23)
CO2: 16 mmol/L — ABNORMAL LOW (ref 22–32)
Calcium: 8.4 mg/dL — ABNORMAL LOW (ref 8.9–10.3)
Chloride: 108 mmol/L (ref 98–111)
Creatinine, Ser: 3.15 mg/dL — ABNORMAL HIGH (ref 0.61–1.24)
GFR, Estimated: 18 mL/min — ABNORMAL LOW (ref >60)
Glucose, Bld: 161 mg/dL — ABNORMAL HIGH (ref 70–99)
Potassium: 5.2 mmol/L — ABNORMAL HIGH (ref 3.5–5.1)
Sodium: 139 mmol/L (ref 135–145)

## 2021-08-23 LAB — HEPARIN LEVEL (UNFRACTIONATED): Heparin Unfractionated: <0.1 IU/mL — ABNORMAL LOW (ref 0.30–0.70)

## 2021-08-23 LAB — CBC
HCT: 31.8 % — ABNORMAL LOW (ref 39.0–52.0)
Hemoglobin: 10.2 g/dL — ABNORMAL LOW (ref 13.0–17.0)
MCH: 30.9 pg (ref 26.0–34.0)
MCHC: 32.1 g/dL (ref 30.0–36.0)
MCV: 96.4 fL (ref 80.0–100.0)
Platelets: 137 10*3/uL — ABNORMAL LOW (ref 150–400)
RBC: 3.3 MIL/uL — ABNORMAL LOW (ref 4.22–5.81)
RDW: 13.9 % (ref 11.5–15.5)
WBC: 12.5 10*3/uL — ABNORMAL HIGH (ref 4.0–10.5)
nRBC: 0 % (ref 0.0–0.2)

## 2021-08-23 LAB — BRAIN NATRIURETIC PEPTIDE: B Natriuretic Peptide: 2826.8 pg/mL — ABNORMAL HIGH (ref 0.0–100.0)

## 2021-08-23 LAB — TROPONIN I (HIGH SENSITIVITY)
Troponin I (High Sensitivity): 1558 ng/L (ref <18)
Troponin I (High Sensitivity): 1348 ng/L (ref <18)

## 2021-08-23 LAB — ECHOCARDIOGRAM COMPLETE
AR max vel: 1.08 cm2
AV Area VTI: 1.22 cm2
AV Area mean vel: 1.06 cm2
AV Mean grad: 12 mmHg
AV Peak grad: 22.3 mmHg
Ao pk vel: 2.36 m/s
Height: 66 in
MV M vel: 4.57 m/s
MV Peak grad: 83.5 mmHg
Radius: 0.6 cm
S' Lateral: 4.06 cm
Single Plane A4C EF: 41.7 %
Weight: 2080 oz

## 2021-08-23 LAB — RESP PANEL BY RT-PCR (FLU A&B, COVID) ARPGX2
Influenza A by PCR: NEGATIVE
Influenza B by PCR: NEGATIVE
SARS Coronavirus 2 by RT PCR: NEGATIVE

## 2021-08-23 LAB — LACTIC ACID, PLASMA
Lactic Acid, Venous: 1.5 mmol/L (ref 0.5–1.9)
Lactic Acid, Venous: 1.2 mmol/L (ref 0.5–1.9)

## 2021-08-23 MED ORDER — SIMVASTATIN 20 MG PO TABS: 20.0000 mg | ORAL_TABLET | Freq: Every day | ORAL | Status: DC

## 2021-08-23 MED ORDER — MILRINONE LACTATE IN DEXTROSE 20-5 MG/100ML-% IV SOLN: 0.2500 ug/kg/min | INTRAVENOUS | Status: DC

## 2021-08-23 MED ORDER — TAMSULOSIN HCL 0.4 MG PO CAPS: 0.8000 mg | ORAL_CAPSULE | Freq: Every day | ORAL | Status: DC

## 2021-08-23 MED ORDER — ACETAMINOPHEN 325 MG PO TABS: 650.0000 mg | ORAL_TABLET | ORAL | Status: DC | PRN

## 2021-08-23 MED ORDER — ATORVASTATIN CALCIUM 40 MG PO TABS
80.0000 mg | ORAL_TABLET | Freq: Every evening | ORAL | Status: DC
  Administered 2021-08-23: 80 mg via ORAL
  Filled 2021-08-23: qty 2

## 2021-08-23 MED ORDER — ASPIRIN EC 81 MG PO TBEC: 81.0000 mg | DELAYED_RELEASE_TABLET | Freq: Every day | ORAL | Status: DC

## 2021-08-23 MED ORDER — ONDANSETRON HCL 4 MG/2ML IJ SOLN: 4.0000 mg | Freq: Four times a day (QID) | INTRAMUSCULAR | Status: DC | PRN

## 2021-08-23 MED ORDER — NITROGLYCERIN 0.4 MG SL SUBL: 0.4000 mg | SUBLINGUAL_TABLET | SUBLINGUAL | Status: DC | PRN

## 2021-08-23 MED ORDER — SODIUM ZIRCONIUM CYCLOSILICATE 5 G PO PACK
5.0000 g | PACK | Freq: Once | ORAL | Status: AC
  Administered 2021-08-23: 5 g via ORAL
  Filled 2021-08-23: qty 1

## 2021-08-23 MED ORDER — ACETAMINOPHEN 325 MG PO TABS: 650.0000 mg | ORAL_TABLET | Freq: Four times a day (QID) | ORAL | Status: DC | PRN

## 2021-08-23 MED ORDER — HEPARIN BOLUS VIA INFUSION
1750.0000 [IU] | Freq: Once | INTRAVENOUS | Status: AC
  Administered 2021-08-23: 1750 [IU] via INTRAVENOUS
  Filled 2021-08-23: qty 1750

## 2021-08-23 MED ORDER — OXYCODONE HCL 5 MG PO TABS
5.0000 mg | ORAL_TABLET | ORAL | Status: DC | PRN
  Administered 2021-08-24: 5 mg via ORAL
  Filled 2021-08-23: qty 1

## 2021-08-23 MED ORDER — HEPARIN BOLUS VIA INFUSION
3500.0000 [IU] | Freq: Once | INTRAVENOUS | Status: AC
  Administered 2021-08-23: 3500 [IU] via INTRAVENOUS
  Filled 2021-08-23: qty 3500

## 2021-08-23 MED ORDER — FUROSEMIDE 10 MG/ML IJ SOLN
80.0000 mg | Freq: Once | INTRAMUSCULAR | Status: AC
  Administered 2021-08-23: 80 mg via INTRAVENOUS
  Filled 2021-08-23: qty 8

## 2021-08-23 MED ORDER — FUROSEMIDE 10 MG/ML IJ SOLN
10.0000 mg/h | INTRAVENOUS | Status: DC
  Administered 2021-08-23: 10 mg/h via INTRAVENOUS
  Filled 2021-08-23: qty 20

## 2021-08-23 MED ORDER — HEPARIN (PORCINE) 25000 UT/250ML-% IV SOLN
850.0000 [IU]/h | INTRAVENOUS | Status: DC
  Administered 2021-08-23: 700 [IU]/h via INTRAVENOUS
  Filled 2021-08-23: qty 250

## 2021-08-23 MED ORDER — ASPIRIN 81 MG PO CHEW
324.0000 mg | CHEWABLE_TABLET | Freq: Once | ORAL | Status: AC
  Administered 2021-08-23: 324 mg via ORAL
  Filled 2021-08-23: qty 4

## 2021-08-23 MED ORDER — ADULT MULTIVITAMIN W/MINERALS CH: 1.0000 | ORAL_TABLET | Freq: Every day | ORAL | Status: DC

## 2021-08-23 MED ORDER — MILRINONE LACTATE IN DEXTROSE 20-5 MG/100ML-% IV SOLN
0.2500 ug/kg/min | INTRAVENOUS | Status: DC
  Administered 2021-08-23: 0.25 ug/kg/min via INTRAVENOUS
  Filled 2021-08-23: qty 100

## 2021-08-23 MED ORDER — LIDOCAINE 5 % EX PTCH
1.0000 | MEDICATED_PATCH | CUTANEOUS | Status: DC
  Administered 2021-08-23: 1 via TRANSDERMAL
  Filled 2021-08-23: qty 1

## 2021-08-23 MED ORDER — MAGNESIUM OXIDE -MG SUPPLEMENT 400 (240 MG) MG PO TABS: 400.0000 mg | ORAL_TABLET | Freq: Every day | ORAL | Status: DC

## 2021-08-23 NOTE — ED Notes (Signed)
Dr Eulis Foster notified of elevated troponin; repeat ekg done per verbal order from Dr. Eulis Foster

## 2021-08-23 NOTE — ED Triage Notes (Signed)
BIB [facility] after family called to report pt having coughing, intermittant chest pressure, and shob x 1 week. Denies chest pressure today.   HX: Valve replacement, bypass.

## 2021-08-23 NOTE — Progress Notes (Signed)
°  Echocardiogram 2D Echocardiogram has been performed.  [name] 09/02/2021, 1:08 PM

## 2021-08-23 NOTE — Assessment & Plan Note (Signed)
-  Elevated BNP, pulmonary edema noted on initial evaluation -Symptoms improved with supplemental O2 and resolved -Echo with EF 40-45% and WMA -Appears to be ischemic cardiomyopathy -Needs Lasix to diurese and eventually medications to address - but diuresis and electrical conduction appear to be priorities over CHF medications -Will defer to cardiology

## 2021-08-23 NOTE — ED Notes (Signed)
cardiology at bedside

## 2021-08-23 NOTE — Assessment & Plan Note (Signed)
-  He does not appear to be taking medications for this issue at this time -It would be reasonable to repeat TSH at some point - inpatient vs. outpatient

## 2021-08-23 NOTE — ED Notes (Addendum)
O2 sats 88-90% on 9L O2 via [place], pt remains hypotensive 92/45; Dr Eulis Foster aware; RT notified of sats; verbal order given to place pt on 15L on salter device

## 2021-08-23 NOTE — Assessment & Plan Note (Addendum)
-  Prior creatinine was 1.68 with GFR 39 in 0/02; otherwise uncertain baseline -Current creatinine is 3.15 with GFR 18 -This appears to be related to hypoperfusion in the setting of complete heart block/acute CHF -He has responded minimally to Lasix -Needs ongoing diuresis and careful monitoring of his renal function -If not improving, he may be approaching cardiorenal syndrome and consideration of palliative care may be important -Would hold lisinopril for now - and maybe indefinitely

## 2021-08-23 NOTE — Consult Note (Signed)
Consult Note   Patient: [name] DOB: 04/23/1929 DOA: 08/29/2021 DOS: the patient was seen and examined on 08/18/2021 PCP: [name], Ricky Lacks, MD  Patient coming from: Home - lives with wife; [name]: Wife, [phone]   Chief Complaint: CP  HPI: [name] is a 88 y.o. male with medical history significant of PVD; BPH; CAD s/p CABG; valve replacement; bladder cancer; HLD; and OSA presenting with CP.  It started many weeks ago with occasional chest pressure, mild breathing difficultly.  It has progressively worsened, last night was the worst and he couldn't stand, walk, lie down.  Nothing would relieve the pressure and the breathing.  He was started on O2 in the ambulance and he felt immediately better, no further issues.  He did not have breathing issues with his prior heart problems.  The pressure has been present for a couple of months and did not appear to be exertional.  Mild periodic L ankle swelling, no prior injury to that ankle. No weight gain.  +orthopnea for the last few days, although maybe mild and present for longer.  He has chronic back pain, some improvement with injection, considering upcoming surgery (Dr. Ellene Route).  Occasional cough, unchanged from prior.    ER Course:  Chest pain with SOB x 1 week.  Hypoxic to 85%, placed on [place] O2 with resolution of CP/SOB.  Mild CHF.  EKG with ?STEMI, Dr. Irish Lack thinks he is in complete heart block.  In acute renal failure, some response to Lasix.  PCCM saw him, currently on HFNC, 15L.  PCCM is consulting, thinks he is ok for stepdown.  Stat echo with EF >50% so unlikely MI.       Review of Systems: As mentioned in the history of present illness. All other systems reviewed and are negative. Past Medical History:  Diagnosis Date   Aortic valve disorders    Echo 3/19: Mild LVH, EF 65-70, dynamic obstruction during Valsalva in mid cavity with mid cavity obliteration (peak velocity 200 cm/s, peak gradient 16 mmHg), normal  wall motion, grade 1 diastolic dysfunction, AVR with mild stenosis (mean gradient 32, peak 56), MAC, mild to moderate MR, mild LAE   Atherosclerosis of renal artery (HCC)    Bilateral renal artery stenosis (HCC)    documented at the time of angiography by Dr. Albertine Patricia   BPH (benign prostatic hypertrophy)    Carotid artery disease ([place])    Carotid US 3/19: Bilateral ICA 40-59, follow-up 1 year   Cerebrovascular disease, unspecified    Coronary atherosclerosis of unspecified type of vessel, native or graft    History of cystoscopy    w/ bladder biopsy   History of echocardiogram    Echo 3/19: mild LVH, EF 65-70, dynamic obstruction during Valsalva with mid cavity obliteration (peak velocity of 200 cm/sec - peak gradient 16 mmHg), no RWMA, Gr 1 DD, s/p AVR, mild AS (mean 32, peak 56), MAC, mild to mod MR, mild LAE   Malignant neoplasm of bladder, part unspecified    Melanoma in situ of back ([place]) 2012   area of left scapula   Old myocardial infarction    Other and unspecified hyperlipidemia    Peripheral vascular disease, unspecified ([place])    history of stent left lower extremity placed by Dr. Albertine Patricia   Postsurgical aortocoronary bypass status    Postsurgical percutaneous transluminal coronary angioplasty status    Scarlet fever    Unspecified sleep apnea    Past Surgical History:  Procedure Laterality  Date   CORONARY ARTERY BYPASS GRAFT     CYSTOSCOPY     w/ bladder biopsy   MELANOMA EXCISION  2012   left scapular  area - clean margins.    Percutaneous transluminal coronary angioplasty     redo median sternotomy     extracorporeal circulation, aortic valve replacement using a 23-mm Mitroflow aortic pericardial heart valve.  Surgeon- Gilford Raid, MD 04-18-10   Social History:  reports that he has never smoked. He has never used smokeless tobacco. He reports current alcohol use. He reports that he does not use drugs.  Allergies  Allergen Reactions   Codeine     constipation    Lactose Diarrhea   Sulfa Antibiotics Hives    Family History  Problem Relation Age of Onset   Hypertension Mother    Diabetes Mother    Hyperlipidemia Mother    Coronary artery disease Mother    Heart disease Mother        CAD, cardiomyopathy   Stroke Mother    Coronary artery disease Father    Sudden death Father        1 wk post MI   Heart attack Father    Heart disease Father        CAD/MI-sudden death   Cancer Other        prostate   Birth defects Neg Hx     Prior to Admission medications   Medication Sig Start Date End Date Taking? Authorizing Provider  amLODipine (NORVASC) 10 MG tablet TAKE ONE TABLET BY MOUTH DAILY Patient taking differently: Take 10 mg by mouth daily. 07/31/21  Yes [name], Ricky Lacks, MD  Ascorbic Acid (VITAMIN C PO) Take 1 capsule by mouth daily.   Yes [provider]  aspirin 325 MG tablet Take 325 mg by mouth every 6 (six) hours as needed for mild pain or headache.   Yes [provider]  aspirin EC 81 MG tablet Take 1 tablet (81 mg total) by mouth daily. 09/26/17  Yes Weaver, Scott T, PA-C  B Complex-C (SUPER B COMPLEX PO) Take 1 tablet by mouth daily.   Yes [provider]  cholecalciferol (VITAMIN D) 1000 UNITS tablet Take 1,000 Units by mouth daily.   Yes [provider]  diclofenac Sodium (VOLTAREN) 1 % GEL Apply 4 g topically 4 (four) times daily as needed (pain).   Yes [provider]  fish oil-omega-3 fatty acids 1000 MG capsule Take 1 g by mouth 2 (two) times daily.   Yes [provider]  ibuprofen (ADVIL) 200 MG tablet Take 200-400 mg by mouth every 6 (six) hours as needed for mild pain or headache.   Yes [provider]  lisinopril (ZESTRIL) 20 MG tablet Take 1 tablet (20 mg total) by mouth daily. 08/25/20  Yes [name], Ricky Lacks, MD  Magnesium 400 MG TABS Take 400 mg by mouth daily.   Yes [provider]  MELATONIN PO Take 1 tablet by mouth at bedtime.   Yes [provider]  metoprolol succinate (TOPROL-XL) 25 MG 24 hr tablet Take 1 tablet (25 mg total) by mouth in the morning and at bedtime. 08/25/20  Yes [name], Ricky Lacks, MD  multivitamin [facility]) tablet Take 1 tablet by mouth daily.   Yes [provider]  nitroGLYCERIN (NITROSTAT) 0.4 MG SL tablet Place 1 tablet (0.4 mg total) under the tongue every 5 (five) minutes as needed for chest pain (Max 3 doses within 15 minutes call 911). 08/12/19  Yes [name], Ricky Lacks, MD  simvastatin (ZOCOR) 20 MG tablet Take 1 tablet (20 mg total) by mouth at bedtime. 08/25/20  Yes [name], Ricky Lacks, MD  tamsulosin (FLOMAX) 0.4 MG CAPS capsule Take 2 capsules (0.8 mg total) by mouth daily. 08/25/20  Yes [name], Ricky Lacks, MD  traMADol (ULTRAM) 50 MG tablet Take 50 mg by mouth every 6 (six) hours as needed for moderate pain. 05/09/21  Yes [provider]  predniSONE (DELTASONE) 5 MG tablet Take 2 tablets (10 mg total) by mouth daily with breakfast. Patient not taking: Reported on 09/10/2021 04/12/21   [name], Ricky Lacks, MD  solifenacin (VESICARE) 5 MG tablet Take 1 tablet (5 mg total) by mouth daily. 12/28/20   Biagio Borg, MD    Physical Exam: Vitals:   09/12/2021 1445 09/09/2021 1515 08/17/2021 1530 08/26/2021 1600  BP: (!) 99/52 (!) 107/51 (!) 103/48 (!) 102/50  Pulse: 94 94 97 100  Resp: (!) 25 (!) 22 (!) 24 (!) 26  Temp:      TempSrc:      SpO2: 93% 93% 93% 93%  Weight:      Height:       General:  Appears calm and comfortable and is in NAD Eyes:  PERRL, EOMI, normal lids, iris ENT:  grossly normal hearing, lips & tongue, mmm; appropriate dentition Neck:  no LAD, masses or thyromegaly Cardiovascular:  RRR, no m/r/g. No LE edema.  Respiratory:   CTA bilaterally with no wheezes/rales/rhonchi.  Mildly increased respiratory effort, on 15L HFNC O2. Abdomen:  soft, NT, ND Skin:  no rash or induration seen on limited exam Musculoskeletal:  grossly normal tone BUE/BLE, good ROM, no bony  abnormality Psychiatric:  grossly normal mood and affect, speech fluent and appropriate, AOx3 Neurologic:  CN 2-12 grossly intact, moves all extremities in coordinated fashion   Radiological Exams on Admission: Independently reviewed - see discussion in A/P where applicable  DG Chest Portable 1 View  Result Date: 09/02/2021 CLINICAL DATA:  Shortness of breath EXAM: PORTABLE CHEST 1 VIEW COMPARISON:  Radiograph 05/17/2010 FINDINGS: Unchanged cardiomediastinal silhouette with prior median sternotomy and postsurgical changes of CABG. There are bibasilar airspace opacities and small, left greater than right pleural effusions. Diffuse mild interstitial opacities. No pneumothorax. Bilateral shoulder osteoarthritis. No acute osseous abnormality. IMPRESSION: Mild pulmonary edema with small bilateral pleural effusions, left greater than right, and bibasilar atelectasis. Electronically Signed   By: Maurine Simmering M.D.   On: 09/12/2021 09:14   ECHOCARDIOGRAM COMPLETE  Result Date: 08/25/2021    ECHOCARDIOGRAM REPORT   Patient Name:   [name] Date of Exam: 08/22/2021 Medical Rec #:  [id number]      Height:       66.0 in Accession #:    [id number]     Weight:       130.0 lb Date of Birth:  May 06, 1929       BSA:          1.665 m Patient Age:    17 years       BP:           91/68 mmHg Patient Gender: M              HR:           90 bpm. Exam Location:  Inpatient Procedure: 2D Echo STAT ECHO Indications:    NSTEMI  History:        Patient has prior history of Echocardiogram examinations, most  recent 07/07/2019. Prior CABG, Arrythmias:RBBB; Risk                 Factors:Hypertension and Sleep Apnea.                 Aortic Valve: bioprosthetic valve is present in the aortic                 position.  Sonographer:    Johny Chess RDCS Referring Phys: [id number] [place]  1. Left ventricular ejection fraction, by estimation, is 40 to 45%. The left ventricle has mildly decreased  function. The left ventricle demonstrates regional wall motion abnormalities (see scoring diagram/findings for description). Indeterminate diastolic filling due to E-A fusion. There is hypokinesis of the left ventricular, entire anterolateral wall and inferolateral wall.  2. Right ventricular systolic function is moderately reduced. The right ventricular size is mildly enlarged. There is mildly elevated pulmonary artery systolic pressure. The estimated right ventricular systolic pressure is 70.3 mmHg.  3. Left atrial size was severely dilated.  4. The mitral valve is degenerative. Moderate to severe mitral valve regurgitation. No evidence of mitral stenosis. Moderate mitral annular calcification.  5. Tricuspid valve regurgitation is mild to moderate.  6. The aortic valve has been repaired/replaced. Aortic valve regurgitation is mild to moderate. There is a bioprosthetic valve present in the aortic position. Aortic valve mean gradient measures 12.0 mmHg. Aortic valve Vmax measures 2.36 m/s.  7. The inferior vena cava is dilated in size with >50% respiratory variability, suggesting right atrial pressure of 8 mmHg. Comparison(s): Prior images reviewed side by side. The left ventricular function is significantly worse. The left ventricular wall motion abnormalities are new. Mitral insufficiency is worse. Aortic valve prosthesis gradients are lower due to decreased left ventricular stroke volume. FINDINGS  Left Ventricle: Left ventricular ejection fraction, by estimation, is 40 to 45%. The left ventricle has mildly decreased function. The left ventricle demonstrates regional wall motion abnormalities. The left ventricular internal cavity size was normal in size. There is no left ventricular hypertrophy. Abnormal (paradoxical) septal motion consistent with post-operative status. Indeterminate diastolic filling due to E-A fusion.  LV Wall Scoring: The entire lateral wall is hypokinetic. Right Ventricle: The right  ventricular size is mildly enlarged. No increase in right ventricular wall thickness. Right ventricular systolic function is moderately reduced. There is mildly elevated pulmonary artery systolic pressure. The tricuspid regurgitant velocity is 3.03 m/s, and with an assumed right atrial pressure of 8 mmHg, the estimated right ventricular systolic pressure is 50.0 mmHg. Left Atrium: Left atrial size was severely dilated. Right Atrium: Right atrial size was normal in size. Pericardium: There is no evidence of pericardial effusion. Mitral Valve: The mitral valve is degenerative in appearance. Moderate mitral annular calcification. Moderate to severe mitral valve regurgitation. No evidence of mitral valve stenosis. Tricuspid Valve: The tricuspid valve is normal in structure. Tricuspid valve regurgitation is mild to moderate. Aortic Valve: The aortic valve has been repaired/replaced. Aortic valve regurgitation is mild to moderate. Aortic valve mean gradient measures 12.0 mmHg. Aortic valve peak gradient measures 22.3 mmHg. Aortic valve area, by VTI measures 1.22 cm. There is  a bioprosthetic valve present in the aortic position. Pulmonic Valve: The pulmonic valve was grossly normal. Pulmonic valve regurgitation is not visualized. Aorta: The aortic root and ascending aorta are structurally normal, with no evidence of dilitation. Venous: The inferior vena cava is dilated in size with greater than 50% respiratory variability, suggesting right atrial pressure of 8 mmHg. IAS/Shunts: No  atrial level shunt detected by color flow Doppler.  LEFT VENTRICLE PLAX 2D LVIDd:         5.54 cm      Diastology LVIDs:         4.06 cm      LV e' lateral: 9.90 cm/s LV PW:         1.08 cm LV IVS:        1.10 cm LVOT diam:     1.90 cm LV SV:         53 LV SV Index:   32 LVOT Area:     2.84 cm  LV Volumes (MOD) LV vol d, MOD A4C: 116.0 ml LV vol s, MOD A4C: 67.6 ml LV SV MOD A4C:     116.0 ml RIGHT VENTRICLE RV Basal diam:  3.47 cm RV S prime:      5.66 cm/s TAPSE (M-mode): 1.0 cm LEFT ATRIUM              Index        RIGHT ATRIUM           Index LA diam:        5.10 cm  3.06 cm/m   RA Area:     14.20 cm LA Vol (A2C):   102.0 ml 61.25 ml/m  RA Volume:   34.50 ml  20.72 ml/m LA Vol (A4C):   85.5 ml  51.34 ml/m LA Biplane Vol: 92.8 ml  55.73 ml/m  AORTIC VALVE AV Area (Vmax):    1.08 cm AV Area (Vmean):   1.06 cm AV Area (VTI):     1.22 cm AV Vmax:           236.00 cm/s AV Vmean:          160.500 cm/s AV VTI:            0.438 m AV Peak Grad:      22.3 mmHg AV Mean Grad:      12.0 mmHg LVOT Vmax:         89.65 cm/s LVOT Vmean:        60.150 cm/s LVOT VTI:          0.188 m LVOT/AV VTI ratio: 0.43  AORTA Ao Root diam: 3.00 cm Ao Asc diam:  3.10 cm MR Peak grad:    83.5 mmHg    TRICUSPID VALVE MR Mean grad:    50.0 mmHg    TR Peak grad:   36.7 mmHg MR Vmax:         457.00 cm/s  TR Vmax:        303.00 cm/s MR Vmean:        329.0 cm/s MR PISA:         2.26 cm     SHUNTS MR PISA Eff ROA: 20 mm       Systemic VTI:  0.19 m MR PISA Radius:  0.60 cm      Systemic Diam: 1.90 cm Dani Gobble Croitoru MD Electronically signed by Sanda Klein MD Signature Date/Time: 09/09/2021/12:54:37 PM    Final     EKG: Independently reviewed.   0843 - NSR with rate 87; ST changes with concern for acute ischemia 1000 - Wandering pacemaker, ?complete heart block with rate 87; ST changes with NSCSLT   Labs on Admission: I have personally reviewed the available labs and imaging studies at the time of the admission.  Pertinent labs:    K+ 5.3 CO2 16 Glucose 161 BUN 65/Creatinine 3.15/GFR  18; 32/1.68/39 on 01/01/21 BNP 2868.8 HS troponin 1558, 1348 WBC 12.5 Hgb 10.2 Platelets 137 COVID/flu negative    Assessment and Plan: * NSTEMI (non-ST elevated myocardial infarction) ([place])- (present on admission) -h/o CABG -Progressive anginal symptoms with CP and SOB culminating in current presentation -Echo with WMA -Initial EKG with ST changes concerning for  ischemia -Markedly elevated troponin but negative delta -Started on heparin drip -Continue Zocor -Management per cardiology  Acute respiratory failure with hypoxia ([place])- (present on admission) -Documented O2 sats as low as 90% with remains tenuous on 15L HFNC -This appears to be related to pulmonary edema and a result of his cardiac issues rather than a prior pulmonary process -Wean O2 as tolerated and continue to closely monitor in progressive care (preferably 2C)  Renal dysfunction -Prior creatinine was 1.68 with GFR 39 in 9/45; otherwise uncertain baseline -Current creatinine is 3.15 with GFR 18 -This appears to be related to hypoperfusion in the setting of complete heart block/acute CHF -He has responded minimally to Lasix -Needs ongoing diuresis and careful monitoring of his renal function -If not improving, he may be approaching cardiorenal syndrome and consideration of palliative care may be important -Would hold lisinopril for now - and maybe indefinitely  Acute CHF (congestive heart failure) (HCC) -Elevated BNP, pulmonary edema noted on initial evaluation -Symptoms improved with supplemental O2 and resolved -Echo with EF 40-45% and WMA -Appears to be ischemic cardiomyopathy -Needs Lasix to diurese and eventually medications to address - but diuresis and electrical conduction appear to be priorities over CHF medications -Will defer to cardiology  Complete heart block ([place])- (present on admission) -He was found to be in complete heart block -This may be related to NSTEMI, heart failure, or the cause of heart failure -Regardless, he appears likely to need a pacemaker -Will defer to cardiology  Osteoarthritis of spine with radiculopathy, lumbar region- (present on admission) -Has chronic back pain -Recent epidural injection with some improvement -Planned for surgery in the future but no date set yet; previously scheduled for last November but that was canceled -Has Ultram  for pain but this is not working well -Will add oxycodone prn and lidoderm patch -Stop Voltaren and Advil for now given renal dysfunction  Essential hypertension- (present on admission) -Hold BP meds for now given unstable rhythm  Hypothyroidism- (present on admission) -He does not appear to be taking medications for this issue at this time -It would be reasonable to repeat TSH at some point - inpatient vs. outpatient  Malignant neoplasm of bladder ([place])- (present on admission) -remote, h/o BCG in 2002 -Has BPH, managed with Flomax  -Also with OAB, managed with Vesicare       Advance Care Planning: Full code   Family Communication: Wife and granddaughter were present throughout evaluation   Thank you for this interesting consult.  [facility] will continue to follow the patient with you.   Total critical care time: 65 minutes Critical care time was exclusive of separately billable procedures and treating other patients. Critical care was necessary to treat or prevent imminent or life-threatening deterioration. Critical care was time spent personally by me on the following activities: development of treatment plan with patient and/or surrogate as well as nursing, discussions with consultants, evaluation of patient's response to treatment, examination of patient, obtaining history from patient or surrogate, ordering and performing treatments and interventions, ordering and review of laboratory studies, ordering and review of radiographic studies, pulse oximetry and re-evaluation of patient's condition.    Author: Karmen Bongo,  MD 09/07/2021 5:18 PM  For on call review [email or web address].

## 2021-08-23 NOTE — Procedures (Signed)
Central Venous Catheter Insertion Procedure Note  [name]  [id number]  15-Sep-1928  Date:08/29/2021  Time:6:52 PM   Provider Performing:Peng Thorstenson Loletha Grayer Tamala Julian   Procedure: Insertion of Non-tunneled Central Venous Catheter(36556) with US guidance (10315)   Indication(s) Medication administration  Consent Obtained verbally from family at bedside.  Anesthesia Topical only with 1% lidocaine   Timeout Verified patient identification, verified procedure, site/side was marked, verified correct patient position, special equipment/implants available, medications/allergies/relevant history reviewed, required imaging and test results available.  Sterile Technique Maximal sterile technique including full sterile barrier drape, hand hygiene, sterile gown, sterile gloves, mask, hair covering, sterile ultrasound probe cover (if used).  Procedure Description Area of catheter insertion was cleaned with chlorhexidine and draped in sterile fashion.  With real-time ultrasound guidance a central venous catheter was placed into the left internal jugular vein. Nonpulsatile blood flow and easy flushing noted in all ports.  The catheter was sutured in place and sterile dressing applied.  Complications/Tolerance None; patient tolerated the procedure well. Chest X-ray is ordered to verify placement for internal jugular or subclavian cannulation.   Chest x-ray is not ordered for femoral cannulation.  EBL Minimal  Specimen(s) None

## 2021-08-23 NOTE — H&P (Addendum)
° ° °  See consult note for details.   Initial plan was to consult, but we are now admitting.   Jettie Booze, MD

## 2021-08-23 NOTE — Assessment & Plan Note (Addendum)
-  h/o CABG -Progressive anginal symptoms with CP and SOB culminating in current presentation -Echo with WMA -Initial EKG with ST changes concerning for ischemia -Markedly elevated troponin but negative delta -Started on heparin drip -Continue Zocor -Management per cardiology

## 2021-08-23 NOTE — Assessment & Plan Note (Signed)
-  He was found to be in complete heart block -This may be related to NSTEMI, heart failure, or the cause of heart failure -Regardless, he appears likely to need a pacemaker -Will defer to cardiology

## 2021-08-23 NOTE — ED Notes (Signed)
Pt became SOB while trying to use urinal, sats dropped into 80's; pt placed on NRB, sats now 98%; Dr. Marigene Ehlers at bedside

## 2021-08-23 NOTE — Progress Notes (Signed)
ANTICOAGULATION CONSULT NOTE - Initial Consult  Pharmacy Consult for IV Heparin Indication: chest pain/ACS  Allergies  Allergen Reactions   Codeine     constipation   Lactose Diarrhea   Sulfa Antibiotics Hives    Patient Measurements: Height: _0  (167.6 cm) Weight: 59 kg (130 lb) IBW/kg (Calculated) : 63.8 Heparin Dosing Weight: 59 kg  Vital Signs: Temp: 98 F (36.7 C) (02/07 0832) Temp Source: Oral (02/07 0832) BP: 97/51 (02/07 1000) Pulse Rate: 93 (02/07 1000)  Labs: Recent Labs    09/06/2021 0840 08/20/2021 0858  HGB 10.2* 9.9*  HCT 31.8* 29.0*  PLT 137*  --   CREATININE 3.15* 3.60*  TROPONINIHS 1,558*  --     Estimated Creatinine Clearance: 11.4 mL/min (A) (by C-G formula based on SCr of 3.6 mg/dL (H)).   Medical History: Past Medical History:  Diagnosis Date   Aortic valve disorders    Echo 3/19: Mild LVH, EF 65-70, dynamic obstruction during Valsalva in mid cavity with mid cavity obliteration (peak velocity 200 cm/s, peak gradient 16 mmHg), normal wall motion, grade 1 diastolic dysfunction, AVR with mild stenosis (mean gradient 32, peak 56), MAC, mild to moderate MR, mild LAE   Atherosclerosis of renal artery (HCC)    Bilateral renal artery stenosis (HCC)    documented at the time of angiography by Dr. Albertine Patricia   BPH (benign prostatic hypertrophy)    Carotid artery disease ([place])    Carotid US 3/19: Bilateral ICA 40-59, follow-up 1 year   Cerebrovascular disease, unspecified    Coronary atherosclerosis of unspecified type of vessel, native or graft    History of cystoscopy    w/ bladder biopsy   History of echocardiogram    Echo 3/19: mild LVH, EF 65-70, dynamic obstruction during Valsalva with mid cavity obliteration (peak velocity of 200 cm/sec - peak gradient 16 mmHg), no RWMA, Gr 1 DD, s/p AVR, mild AS (mean 32, peak 56), MAC, mild to mod MR, mild LAE   Malignant neoplasm of bladder, part unspecified    Melanoma in situ of back ([place]) 2012   area of  left scapula   Old myocardial infarction    Other and unspecified hyperlipidemia    Peripheral vascular disease, unspecified ([place])    history of stent left lower extremity placed by Dr. Albertine Patricia   Postsurgical aortocoronary bypass status    Postsurgical percutaneous transluminal coronary angioplasty status    Scarlet fever    Unspecified sleep apnea     Medications:  Scheduled:   aspirin  324 mg Oral Once   sodium zirconium cyclosilicate  5 g Oral Once   Infusions:   Assessment: 88 years of age male with chest pain and ST elevation on EKG. Pharmacy consulted for IV Heparin therapy. Patient was not on anticoagulation prior to admission.  High sensitivity Troponin 1558 Hemoglobin low at 9.9. Platelets 137. Both down from prior baselines.  Patient with AKI noted and SCr up at 3.60 (baseline ~1.4).  No overt bleeding reported.   Goal of Therapy:  Heparin level 0.3-0.7 units/ml Monitor platelets by anticoagulation protocol: Yes   Plan:  Give 3500 units bolus x 1 Start heparin infusion at 700 units/hr Check anti-Xa level in 8 hours and daily while on heparin Continue to monitor H&H and platelets  Sloan Leiter, PharmD, BCPS, BCCCP Clinical Pharmacist Please refer to [facility] for [place] numbers 08/20/2021,10:20 AM

## 2021-08-23 NOTE — Assessment & Plan Note (Addendum)
-  Has chronic back pain -Recent epidural injection with some improvement -Planned for surgery in the future but no date set yet; previously scheduled for last November but that was canceled -Has Ultram for pain but this is not working well -Will add oxycodone prn and lidoderm patch -Stop Voltaren and Advil for now given renal dysfunction

## 2021-08-23 NOTE — Assessment & Plan Note (Signed)
-  Documented O2 sats as low as 90% with remains tenuous on 15L HFNC -This appears to be related to pulmonary edema and a result of his cardiac issues rather than a prior pulmonary process -Wean O2 as tolerated and continue to closely monitor in progressive care (preferably 2C)

## 2021-08-23 NOTE — Progress Notes (Signed)
ANTICOAGULATION CONSULT NOTE - Follow Up Consult  Pharmacy Consult for IV heparin Indication: chest pain/ACS  Allergies  Allergen Reactions   Codeine     constipation   Lactose Diarrhea   Sulfa Antibiotics Hives    Patient Measurements: Height: 5\' 6"  (167.6 cm) Weight: 59 kg (130 lb) IBW/kg (Calculated) : 63.8 Heparin Dosing Weight: 59 kg  Vital Signs: BP: 114/53 (02/07 1945) Pulse Rate: 96 (02/07 1945)  Labs: Recent Labs    09/10/2021 0840 08/19/2021 0858 09/02/2021 1036 08/22/2021 2002  HGB 10.2* 9.9*  --   --   HCT 31.8* 29.0*  --   --   PLT 137*  --   --   --   HEPARINUNFRC  --   --   --  <0.10*  CREATININE 3.15* 3.60*  --   --   TROPONINIHS 1,558*  --  1,348*  --     Estimated Creatinine Clearance: 11.4 mL/min (A) (by C-G formula based on SCr of 3.6 mg/dL (H)).   Assessment: 88 YO male with chest pain and ST elevation on EKG. Pharmacy consulted for IV Heparin therapy. Patient was not on anticoagulation prior to admission.   High sensitivity Troponin 1558. Initial heparin level was subtherapeutic at <0.10. CBC remains low but stable. No s/sx of bleeding noted.   Goal of Therapy:  Heparin level 0.3-0.7 units/ml Monitor platelets by anticoagulation protocol: Yes   Plan:  Give heparin bolus 1750 units x 1 Increase heparin gtt to 850 units/hr 8h confirmatory heparin level Continue to monitor CBC, HL, and s/sx of bleeding daily  Pauletta Browns 09/12/2021,9:25 PM

## 2021-08-23 NOTE — Assessment & Plan Note (Signed)
-  remote, h/o BCG in 2002 -Has BPH, managed with Flomax  -Also with OAB, managed with Vesicare

## 2021-08-23 NOTE — ED Provider Notes (Signed)
[facility] EMERGENCY DEPARTMENT Provider Note   CSN: [id number] Arrival date & time: 08/22/2021  0825     History  Chief Complaint  Patient presents with   Shortness of Breath    [name] is a 88 y.o. male.  HPI Patient seen by me at 8:48 AM.  He presented by EMS for evaluation of shortness of breath and chest pain.  He was hypoxic in the field at 85% and placed on nasal cannula oxygen 7 L with improvement to normal oxygenation.  He has been having periods of chest pressure, on and off for a week.  Shortness of breath has been ongoing as well.  He took his morning medicines.  He has not had much to eat over the last 24 hours because of not feeling hungry.    Home Medications Prior to Admission medications   Medication Sig Start Date End Date Taking? Authorizing Provider  amLODipine (NORVASC) 10 MG tablet TAKE ONE TABLET BY MOUTH DAILY Patient taking differently: Take 10 mg by mouth daily. 07/31/21  Yes Plotnikov, Evie Lacks, MD  Ascorbic Acid (VITAMIN C PO) Take 1 capsule by mouth daily.   Yes [provider]  aspirin 325 MG tablet Take 325 mg by mouth every 6 (six) hours as needed for mild pain or headache.   Yes [provider]  aspirin EC 81 MG tablet Take 1 tablet (81 mg total) by mouth daily. 09/26/17  Yes Weaver, Scott T, PA-C  B Complex-C (SUPER B COMPLEX PO) Take 1 tablet by mouth daily.   Yes [provider]  cholecalciferol (VITAMIN D) 1000 UNITS tablet Take 1,000 Units by mouth daily.   Yes [provider]  diclofenac Sodium (VOLTAREN) 1 % GEL Apply 4 g topically 4 (four) times daily as needed (pain).   Yes [provider]  fish oil-omega-3 fatty acids 1000 MG capsule Take 1 g by mouth 2 (two) times daily.   Yes [provider]  ibuprofen (ADVIL) 200 MG tablet Take 200-400 mg by mouth every 6 (six) hours as needed for mild pain or headache.   Yes [provider]  lisinopril (ZESTRIL) 20 MG  tablet Take 1 tablet (20 mg total) by mouth daily. 08/25/20  Yes Plotnikov, Evie Lacks, MD  Magnesium 400 MG TABS Take 400 mg by mouth daily.   Yes [provider]  MELATONIN PO Take 1 tablet by mouth at bedtime.   Yes [provider]  metoprolol succinate (TOPROL-XL) 25 MG 24 hr tablet Take 1 tablet (25 mg total) by mouth in the morning and at bedtime. 08/25/20  Yes Plotnikov, Evie Lacks, MD  multivitamin [facility]) tablet Take 1 tablet by mouth daily.   Yes [provider]  nitroGLYCERIN (NITROSTAT) 0.4 MG SL tablet Place 1 tablet (0.4 mg total) under the tongue every 5 (five) minutes as needed for chest pain (Max 3 doses within 15 minutes call 911). 08/12/19  Yes Plotnikov, Evie Lacks, MD  simvastatin (ZOCOR) 20 MG tablet Take 1 tablet (20 mg total) by mouth at bedtime. 08/25/20  Yes Plotnikov, Evie Lacks, MD  tamsulosin (FLOMAX) 0.4 MG CAPS capsule Take 2 capsules (0.8 mg total) by mouth daily. 08/25/20  Yes Plotnikov, Evie Lacks, MD  traMADol (ULTRAM) 50 MG tablet Take 50 mg by mouth every 6 (six) hours as needed for moderate pain. 05/09/21  Yes [provider]  predniSONE (DELTASONE) 5 MG tablet Take 2 tablets (10 mg total) by mouth daily with breakfast. Patient  not taking: Reported on 09/03/2021 04/12/21   Plotnikov, Evie Lacks, MD  solifenacin (VESICARE) 5 MG tablet Take 1 tablet (5 mg total) by mouth daily. 12/28/20   Biagio Borg, MD      Allergies    Codeine, Lactose, and Sulfa antibiotics    Review of Systems   Review of Systems  Physical Exam Updated Vital Signs BP (!) 94/42    Pulse 91    Temp 98 F (36.7 C) (Oral)    Resp (!) 26    Ht 5\' 6"  (1.676 m)    Wt 59 kg    SpO2 92%    BMI 20.98 kg/m  Physical Exam Vitals and nursing note reviewed.  Constitutional:      General: He is not in acute distress.    Appearance: He is well-developed. He is not ill-appearing or toxic-appearing.  HENT:     Head: Normocephalic and atraumatic.     Right Ear: External ear  normal.     Left Ear: External ear normal.  Eyes:     Conjunctiva/sclera: Conjunctivae normal.     Pupils: Pupils are equal, round, and reactive to light.  Neck:     Trachea: Phonation normal.  Cardiovascular:     Rate and Rhythm: Normal rate and regular rhythm.     Heart sounds: Normal heart sounds.  Pulmonary:     Effort: Pulmonary effort is normal.     Breath sounds: Normal breath sounds.  Abdominal:     Palpations: Abdomen is soft.     Tenderness: There is no abdominal tenderness.  Musculoskeletal:        General: Normal range of motion.     Cervical back: Normal range of motion and neck supple.  Skin:    General: Skin is warm and dry.  Neurological:     Mental Status: He is alert and oriented to person, place, and time.     Cranial Nerves: No cranial nerve deficit.     Sensory: No sensory deficit.     Motor: No abnormal muscle tone.     Coordination: Coordination normal.  Psychiatric:        Behavior: Behavior normal.        Thought Content: Thought content normal.        Judgment: Judgment normal.    ED Results / Procedures / Treatments   Labs (all labs ordered are listed, but only abnormal results are displayed) Labs Reviewed  BASIC METABOLIC PANEL - Abnormal; Notable for the following components:      Result Value   Potassium 5.2 (*)    CO2 16 (*)    Glucose, Bld 161 (*)    BUN 65 (*)    Creatinine, Ser 3.15 (*)    Calcium 8.4 (*)    GFR, Estimated 18 (*)    All other components within normal limits  CBC - Abnormal; Notable for the following components:   WBC 12.5 (*)    RBC 3.30 (*)    Hemoglobin 10.2 (*)    HCT 31.8 (*)    Platelets 137 (*)    All other components within normal limits  BRAIN NATRIURETIC PEPTIDE - Abnormal; Notable for the following components:   B Natriuretic Peptide 2,826.8 (*)    All other components within normal limits  I-STAT CHEM 8, ED - Abnormal; Notable for the following components:   Potassium 5.2 (*)    Chloride 113 (*)     BUN 59 (*)    Creatinine, Ser  3.60 (*)    Glucose, Bld 148 (*)    Calcium, Ion 1.14 (*)    TCO2 20 (*)    Hemoglobin 9.9 (*)    HCT 29.0 (*)    All other components within normal limits  TROPONIN I (HIGH SENSITIVITY) - Abnormal; Notable for the following components:   Troponin I (High Sensitivity) 1,558 (*)    All other components within normal limits  TROPONIN I (HIGH SENSITIVITY) - Abnormal; Notable for the following components:   Troponin I (High Sensitivity) 1,348 (*)    All other components within normal limits  RESP PANEL BY RT-PCR (FLU A&B, COVID) ARPGX2  HEPARIN LEVEL (UNFRACTIONATED)    EKG EKG Interpretation  Date/Time:  Tuesday August 23 2021 10:00:27 EST Ventricular Rate:  87 PR Interval:  288 QRS Duration: 140 QT Interval:  421 QTC Calculation: 507 R Axis:   -52 Text Interpretation: Wandering atrial pacemaker Biatrial enlargement RBBB and LAFB Nonspecific ST abnormality Since last tracing of earlier today No significant change was found Confirmed by Daleen Bo [phone]) on 08/17/2021 10:16:00 AM  Radiology DG Chest Portable 1 View  Result Date: 08/18/2021 CLINICAL DATA:  Shortness of breath EXAM: PORTABLE CHEST 1 VIEW COMPARISON:  Radiograph 05/17/2010 FINDINGS: Unchanged cardiomediastinal silhouette with prior median sternotomy and postsurgical changes of CABG. There are bibasilar airspace opacities and small, left greater than right pleural effusions. Diffuse mild interstitial opacities. No pneumothorax. Bilateral shoulder osteoarthritis. No acute osseous abnormality. IMPRESSION: Mild pulmonary edema with small bilateral pleural effusions, left greater than right, and bibasilar atelectasis. Electronically Signed   By: Maurine Simmering M.D.   On: 08/17/2021 09:14   ECHOCARDIOGRAM COMPLETE  Result Date: 09/07/2021    ECHOCARDIOGRAM REPORT   Patient Name:   [name] Date of Exam: 08/30/2021 Medical Rec #:  [id number]      Height:       66.0 in Accession #:     [id number]     Weight:       130.0 lb Date of Birth:  Mar 11, 1929       BSA:          1.665 m Patient Age:    42 years       BP:           91/68 mmHg Patient Gender: M              HR:           90 bpm. Exam Location:  Inpatient Procedure: 2D Echo STAT ECHO Indications:    NSTEMI  History:        Patient has prior history of Echocardiogram examinations, most                 recent 07/07/2019. Prior CABG, Arrythmias:RBBB; Risk                 Factors:Hypertension and Sleep Apnea.                 Aortic Valve: bioprosthetic valve is present in the aortic                 position.  Sonographer:    Johny Chess RDCS Referring Phys: [id number] [place]  1. Left ventricular ejection fraction, by estimation, is 40 to 45%. The left ventricle has mildly decreased function. The left ventricle demonstrates regional wall motion abnormalities (see scoring diagram/findings for description). Indeterminate diastolic filling due to E-A fusion. There is hypokinesis of the left ventricular, entire anterolateral  wall and inferolateral wall.  2. Right ventricular systolic function is moderately reduced. The right ventricular size is mildly enlarged. There is mildly elevated pulmonary artery systolic pressure. The estimated right ventricular systolic pressure is 70.6 mmHg.  3. Left atrial size was severely dilated.  4. The mitral valve is degenerative. Moderate to severe mitral valve regurgitation. No evidence of mitral stenosis. Moderate mitral annular calcification.  5. Tricuspid valve regurgitation is mild to moderate.  6. The aortic valve has been repaired/replaced. Aortic valve regurgitation is mild to moderate. There is a bioprosthetic valve present in the aortic position. Aortic valve mean gradient measures 12.0 mmHg. Aortic valve Vmax measures 2.36 m/s.  7. The inferior vena cava is dilated in size with >50% respiratory variability, suggesting right atrial pressure of 8 mmHg. Comparison(s): Prior images  reviewed side by side. The left ventricular function is significantly worse. The left ventricular wall motion abnormalities are new. Mitral insufficiency is worse. Aortic valve prosthesis gradients are lower due to decreased left ventricular stroke volume. FINDINGS  Left Ventricle: Left ventricular ejection fraction, by estimation, is 40 to 45%. The left ventricle has mildly decreased function. The left ventricle demonstrates regional wall motion abnormalities. The left ventricular internal cavity size was normal in size. There is no left ventricular hypertrophy. Abnormal (paradoxical) septal motion consistent with post-operative status. Indeterminate diastolic filling due to E-A fusion.  LV Wall Scoring: The entire lateral wall is hypokinetic. Right Ventricle: The right ventricular size is mildly enlarged. No increase in right ventricular wall thickness. Right ventricular systolic function is moderately reduced. There is mildly elevated pulmonary artery systolic pressure. The tricuspid regurgitant velocity is 3.03 m/s, and with an assumed right atrial pressure of 8 mmHg, the estimated right ventricular systolic pressure is 23.7 mmHg. Left Atrium: Left atrial size was severely dilated. Right Atrium: Right atrial size was normal in size. Pericardium: There is no evidence of pericardial effusion. Mitral Valve: The mitral valve is degenerative in appearance. Moderate mitral annular calcification. Moderate to severe mitral valve regurgitation. No evidence of mitral valve stenosis. Tricuspid Valve: The tricuspid valve is normal in structure. Tricuspid valve regurgitation is mild to moderate. Aortic Valve: The aortic valve has been repaired/replaced. Aortic valve regurgitation is mild to moderate. Aortic valve mean gradient measures 12.0 mmHg. Aortic valve peak gradient measures 22.3 mmHg. Aortic valve area, by VTI measures 1.22 cm. There is  a bioprosthetic valve present in the aortic position. Pulmonic Valve: The  pulmonic valve was grossly normal. Pulmonic valve regurgitation is not visualized. Aorta: The aortic root and ascending aorta are structurally normal, with no evidence of dilitation. Venous: The inferior vena cava is dilated in size with greater than 50% respiratory variability, suggesting right atrial pressure of 8 mmHg. IAS/Shunts: No atrial level shunt detected by color flow Doppler.  LEFT VENTRICLE PLAX 2D LVIDd:         5.54 cm      Diastology LVIDs:         4.06 cm      LV e' lateral: 9.90 cm/s LV PW:         1.08 cm LV IVS:        1.10 cm LVOT diam:     1.90 cm LV SV:         53 LV SV Index:   32 LVOT Area:     2.84 cm  LV Volumes (MOD) LV vol d, MOD A4C: 116.0 ml LV vol s, MOD A4C: 67.6 ml LV SV MOD A4C:  116.0 ml RIGHT VENTRICLE RV Basal diam:  3.47 cm RV S prime:     5.66 cm/s TAPSE (M-mode): 1.0 cm LEFT ATRIUM              Index        RIGHT ATRIUM           Index LA diam:        5.10 cm  3.06 cm/m   RA Area:     14.20 cm LA Vol (A2C):   102.0 ml 61.25 ml/m  RA Volume:   34.50 ml  20.72 ml/m LA Vol (A4C):   85.5 ml  51.34 ml/m LA Biplane Vol: 92.8 ml  55.73 ml/m  AORTIC VALVE AV Area (Vmax):    1.08 cm AV Area (Vmean):   1.06 cm AV Area (VTI):     1.22 cm AV Vmax:           236.00 cm/s AV Vmean:          160.500 cm/s AV VTI:            0.438 m AV Peak Grad:      22.3 mmHg AV Mean Grad:      12.0 mmHg LVOT Vmax:         89.65 cm/s LVOT Vmean:        60.150 cm/s LVOT VTI:          0.188 m LVOT/AV VTI ratio: 0.43  AORTA Ao Root diam: 3.00 cm Ao Asc diam:  3.10 cm MR Peak grad:    83.5 mmHg    TRICUSPID VALVE MR Mean grad:    50.0 mmHg    TR Peak grad:   36.7 mmHg MR Vmax:         457.00 cm/s  TR Vmax:        303.00 cm/s MR Vmean:        329.0 cm/s MR PISA:         2.26 cm     SHUNTS MR PISA Eff ROA: 20 mm       Systemic VTI:  0.19 m MR PISA Radius:  0.60 cm      Systemic Diam: 1.90 cm Mihai Croitoru MD Electronically signed by Sanda Klein MD Signature Date/Time: 08/28/2021/12:54:37 PM     Final     Procedures Procedures    Medications Ordered in ED Medications  heparin ADULT infusion 100 units/mL (25000 units/214mL) (700 Units/hr Intravenous New Bag/Given 08/31/2021 1036)  sodium zirconium cyclosilicate (LOKELMA) packet 5 g (5 g Oral Given 09/04/2021 1037)  aspirin chewable tablet 324 mg (324 mg Oral Given 09/06/2021 1024)  heparin bolus via infusion 3,500 Units (3,500 Units Intravenous Bolus from Bag 09/02/2021 1036)  furosemide (LASIX) injection 80 mg (80 mg Intravenous Given 09/03/2021 1220)    ED Course/ Medical Decision Making/ A&P Clinical Course as of 09/08/2021 1321  Tue Aug 23, 2021  0858 I discussed the EKG taken at 8:43 AM with Dr. Peter Martinique.  We will not initiate STEMI protocol at this time. [EW]  9024 I was able to reach cardiac provider team return page, they stated they would come and see the patient.  Acute abnormalities of abnormal EKG and elevated troponin discussed. [EW]  1019 Aspirin and heparin started for ACS treatment [EW]  [place] given for hyperkalemia [EW]  1042 Patient remains comfortable currently nasal cannula oxygen at 8 L/min.  He is drinking some water, now.  He states he is not short of breath or  having chest pain at this point. [EW]  1047 Cardiology provider entering room now, he states that it is anticipated that they will consult and that the patient will be admitted by internal medicine. [EW]  8295 He has been seen by Dr. Irish Lack, who states he will get a stat cardiac echo.  He feels that the patient is in complete heart block and that is causing his heart failure as well as his acute kidney injury [EW]  43 Dr. Irish Lack states that the patient's EF is greater than 50% on the stat echo.  Patient has now urinated about 100 cc.  He is comfortable but is on 15 L nasal cannula.  We will consult intensivist service for admission. [EW]    Clinical Course User Index [EW] Daleen Bo, MD                           Medical Decision Making Patient  presenting with chest pain and shortness of breath.  He has a history of coronary artery bypass grafting, and aortic valve replacement with a bioprosthetic valve.  He has ongoing pain and shortness of breath.  Initial EKG abnormal concerning for ST elevation.  Troponin returned positive.  Conversation with cardiology initially after EKG and STEMI protocol not initiated.  Amount and/or Complexity of Data Reviewed Independent Historian: caregiver    Details: Family at bedside helps to give history. External Data Reviewed: labs, radiology, ECG and notes.    Details: Prior cardiac echo indicates normal systolic ejection fraction.  Prior EKG shows bundle branch blocks.  Prior labs with normal creatinine. Labs: ordered.    Details: CBC, metabolic panel, troponin-multiple abnormalities including potassium high, CO2 low, glucose, BUN, creatinine high, calcium low, GFR 12, white count high, hemoglobin low, troponin high on initial result Radiology: ordered and independent interpretation performed.    Details: Chest x-ray-consistent with pulmonary edema and effusions bilaterally ECG/medicine tests: ordered and independent interpretation performed.    Details: Cardiac monitor with normal rate, questionably sinus versus wandering atrial pacemaker.  Current and prior EKGs reviewed and interpreted by me.  ST segment abnormality noted, increased in aVL and decreased inferiorly.  Prior bundle branch block present. Discussion of management or test interpretation with external provider(s): Case discussed with STEMI doctor, at 8:58 AM.  Cardiology consultation at 10:18 AM for evaluation of the patient in the ED to help with care decisions and management.  Patient appears to be having acute MI will require further treatment.  Delta troponin is actually lower.  Patient was able to void some after Lasix.  His hypoxia required stabilization with high flow nasal cannula oxygen.  Cardiology service to remain on as consultant,  intensivist consulted to admit patient.  Risk OTC drugs. Prescription drug management. Decision regarding hospitalization. Risk Details: Patient with acute MI, elevated troponin, acute renal failure, elevated potassium, and anemia.  Chest x-ray indicating pulmonary edema.  Patient is critically ill.  Patient has multiple problems, likely centered around acute complete heart block.  Likely his heart rate is greater than 90.  He is fairly comfortable on nasal cannula oxygen at high flow, but is at risk for decompensation requiring intubation.  He does not appear to have a bladder outlet obstruction causing his acute renal failure.  He did not require pacing in the emergency department because his heart rate was greater than 60.  Critical Care Total time providing critical care: 30-74 minutes  Final Clinical Impression(s) / ED Diagnoses Final diagnoses:  Acute pulmonary edema (HCC)  Acute renal failure, unspecified acute renal failure type (HCC)  Hyperkalemia  Complete heart block ([place])  Hypoxia    Rx / DC Orders ED Discharge Orders     None         Daleen Bo, MD 09/03/2021 1328

## 2021-08-23 NOTE — Consult Note (Addendum)
Advanced Heart Failure Team Consult Note   Primary Physician: Cassandria Anger, MD PCP-Cardiologist:  Sherren Mocha, MD  Reason for Consultation: Acute systolic CHF  HPI:    [name] is seen today for evaluation of heart failure at the request of Dr Scarlette Calico.   [name] is a 88 year old with h/o coronary artery disease status post CABG in 1995, status post PCI/stenting to the SVG-OM in 2008, aortic stenosis status post bioprosthetic aortic valve replacement in 2011 with known stenosis, peripheral vascular disease s/p remote L lower extremity stenting, renal artery stenosis .    Last echo 07/07/2019 with LVEF of 65-70% and grade II DD. Mean gradient 22 mmHg across aortic valve prosthesis. Aortic valve area 1.45 cm2 by VTI. Moderate Ricky.  He was in his USOH until 2 months ago. Reports progressively worsening dyspnea with exertion associated with chest pressure. Last 2 weeks he's had trouble sleeping d/t orthopnea and PND. Notes LE edema. Significant decline in activity tolerance. No palpitations presyncope or syncope.  Presented to the ED via EMS this am with chest pain and shortness of breath. Requiring 15 liters oxygen. CXR with pulmonary edema with small bilateral pleural effusions. Given 80 mg IV lasix x1 with 150 cc urine output. Echo repeated EF 40-45%, new hypokinesis anterolateral and inferolateral wall, RV moderately reduced, mod-severe Ricky, mean gradient 12 mmHg across aortic valve prosthesis. SARS negative. Creatinine 3.15, CO 16, K 5.2 HS Trop 1558, BNP 2826, troponin 5643>3295.  Initial concern for possible complete heart block on review of telemetry. Discussed with EP. Appears either sinus/sinus tach with prolonged 1st degree AV block vs wenckebach AV block.   Dyspnea improving at time of evaluation. Felt much better after started on high flow O2. Chest pressure resolved.  He has been quite active for his age. Still lives independently at home with his  wife.  Review of Systems: [y] = yes, _0  = no   General: Weight gain _1 ; Weight loss _2 ; Anorexia _3 ; Fatigue [Y]; Fever _4 ; Chills _5 ; Weakness _6   Cardiac: Chest pain/pressure [Y ]; Resting SOB [Y]; Exertional SOB [Y]; Orthopnea [Y]; Pedal Edema [Y]; Palpitations _7 ; Syncope _8 ; Presyncope _9 ; Paroxysmal nocturnal dyspnea[Y]  Pulmonary: Cough _10 ; Wheezing_11 ; Hemoptysis_12 ; Sputum _13 ; Snoring _14   GI: Vomiting_15 ; Dysphagia_16 ; Melena_17 ; Hematochezia _18 ; Heartburn_19 ; Abdominal pain _20 ; Constipation _21 ; Diarrhea _22 ; BRBPR _23   GU: Hematuria_24 ; Dysuria _25 ; Nocturia_26   Vascular: Pain in legs with walking _27 ; Pain in feet with lying flat _28 ; Non-healing sores _29 ; Stroke _30 ; TIA _31 ; Slurred speech _32 ;  Neuro: Headaches_33 ; Vertigo_34 ; Seizures_35 ; Paresthesias_36 ;Blurred vision _37 ; Diplopia _38 ; Vision changes _39   Ortho/Skin: Arthritis _40 ; Joint pain _41 ; Muscle pain _42 ; Joint swelling _43 ; Back Pain _44 ; Rash _45   Psych: Depression_46 ; Anxiety_47   Heme: Bleeding problems _48 ; Clotting disorders _49 ; Anemia _50   Endocrine: Diabetes _51 ; Thyroid dysfunction_52   Home Medications Prior to Admission medications   Medication Sig Start Date End Date Taking? Authorizing Provider  amLODipine (NORVASC) 10 MG tablet TAKE ONE TABLET BY MOUTH DAILY Patient taking differently: Take 10 mg by mouth daily. 07/31/21  Yes Plotnikov, Evie Lacks, MD  Ascorbic Acid (VITAMIN C PO) Take 1 capsule by mouth daily.  Yes [provider]  aspirin 325 MG tablet Take 325 mg by mouth every 6 (six) hours as needed for mild pain or headache.   Yes [provider]  aspirin EC 81 MG tablet Take 1 tablet (81 mg total) by mouth daily. 09/26/17  Yes Weaver, Scott T, PA-C  B Complex-C (SUPER B COMPLEX PO) Take 1 tablet by mouth daily.   Yes [provider]  cholecalciferol (VITAMIN D) 1000 UNITS tablet Take 1,000 Units by mouth daily.   Yes [provider]  diclofenac  Sodium (VOLTAREN) 1 % GEL Apply 4 g topically 4 (four) times daily as needed (pain).   Yes [provider]  fish oil-omega-3 fatty acids 1000 MG capsule Take 1 g by mouth 2 (two) times daily.   Yes [provider]  ibuprofen (ADVIL) 200 MG tablet Take 200-400 mg by mouth every 6 (six) hours as needed for mild pain or headache.   Yes [provider]  lisinopril (ZESTRIL) 20 MG tablet Take 1 tablet (20 mg total) by mouth daily. 08/25/20  Yes Plotnikov, Evie Lacks, MD  Magnesium 400 MG TABS Take 400 mg by mouth daily.   Yes [provider]  MELATONIN PO Take 1 tablet by mouth at bedtime.   Yes [provider]  metoprolol succinate (TOPROL-XL) 25 MG 24 hr tablet Take 1 tablet (25 mg total) by mouth in the morning and at bedtime. 08/25/20  Yes Plotnikov, Evie Lacks, MD  multivitamin [facility]) tablet Take 1 tablet by mouth daily.   Yes [provider]  nitroGLYCERIN (NITROSTAT) 0.4 MG SL tablet Place 1 tablet (0.4 mg total) under the tongue every 5 (five) minutes as needed for chest pain (Max 3 doses within 15 minutes call 911). 08/12/19  Yes Plotnikov, Evie Lacks, MD  simvastatin (ZOCOR) 20 MG tablet Take 1 tablet (20 mg total) by mouth at bedtime. 08/25/20  Yes Plotnikov, Evie Lacks, MD  tamsulosin (FLOMAX) 0.4 MG CAPS capsule Take 2 capsules (0.8 mg total) by mouth daily. 08/25/20  Yes Plotnikov, Evie Lacks, MD  traMADol (ULTRAM) 50 MG tablet Take 50 mg by mouth every 6 (six) hours as needed for moderate pain. 05/09/21  Yes [provider]  predniSONE (DELTASONE) 5 MG tablet Take 2 tablets (10 mg total) by mouth daily with breakfast. Patient not taking: Reported on 08/19/2021 04/12/21   Plotnikov, Evie Lacks, MD  solifenacin (VESICARE) 5 MG tablet Take 1 tablet (5 mg total) by mouth daily. 12/28/20   Biagio Borg, MD    Past Medical History: Past Medical History:  Diagnosis Date   Aortic valve disorders    Echo 3/19: Mild LVH, EF 65-70, dynamic  obstruction during Valsalva in mid cavity with mid cavity obliteration (peak velocity 200 cm/s, peak gradient 16 mmHg), normal wall motion, grade 1 diastolic dysfunction, AVR with mild stenosis (mean gradient 32, peak 56), MAC, mild to moderate Ricky, mild LAE   Atherosclerosis of renal artery (HCC)    Bilateral renal artery stenosis (HCC)    documented at the time of angiography by Dr. Albertine Patricia   BPH (benign prostatic hypertrophy)    Carotid artery disease ([place])    Carotid US 3/19: Bilateral ICA 40-59, follow-up 1 year   Cerebrovascular disease, unspecified    Coronary atherosclerosis of unspecified type of vessel, native or graft    History of cystoscopy    w/ bladder biopsy   History of echocardiogram    Echo 3/19: mild LVH, EF 65-70, dynamic obstruction during  Valsalva with mid cavity obliteration (peak velocity of 200 cm/sec - peak gradient 16 mmHg), no RWMA, Gr 1 DD, s/p AVR, mild AS (mean 32, peak 56), MAC, mild to mod Ricky, mild LAE   Malignant neoplasm of bladder, part unspecified    Melanoma in situ of back ([place]) 2012   area of left scapula   Old myocardial infarction    Other and unspecified hyperlipidemia    Peripheral vascular disease, unspecified ([place])    history of stent left lower extremity placed by Dr. Albertine Patricia   Postsurgical aortocoronary bypass status    Postsurgical percutaneous transluminal coronary angioplasty status    Scarlet fever    Unspecified sleep apnea     Past Surgical History: Past Surgical History:  Procedure Laterality Date   CORONARY ARTERY BYPASS GRAFT     CYSTOSCOPY     w/ bladder biopsy   MELANOMA EXCISION  2012   left scapular  area - clean margins.    Percutaneous transluminal coronary angioplasty     redo median sternotomy     extracorporeal circulation, aortic valve replacement using a 23-mm Mitroflow aortic pericardial heart valve.  Surgeon- Gilford Raid, MD 04-18-10    Family History: Family History  Problem Relation Age of Onset    Hypertension Mother    Diabetes Mother    Hyperlipidemia Mother    Coronary artery disease Mother    Heart disease Mother        CAD, cardiomyopathy   Stroke Mother    Coronary artery disease Father    Sudden death Father        1 wk post MI   Heart attack Father    Heart disease Father        CAD/MI-sudden death   Cancer Other        prostate   Birth defects Neg Hx     Social History: Social History   Socioeconomic History   Marital status: Married    Spouse name: Not on file   Number of children: 3   Years of education: 67   Highest education level: Not on file  Occupational History   Occupation: Lobbyist: RETIRED   Occupation: retired  Tobacco Use   Smoking status: Never   Smokeless tobacco: Never  Scientific laboratory technician Use: Never used  Substance and Sexual Activity   Alcohol use: Yes    Comment: rare   Drug use: No   Sexual activity: Not Currently  Other Topics Concern   Not on file  Social History Narrative   HSG, [employer], did some graduate work. Married '52. 1 son - '61; 2 daughters -'39, '63: grandchildren 7; great-grands 94. 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 Active gardner, Designer, jewellery. good marriage.   Social Determinants of Health   Financial Resource Strain: Low Risk    Difficulty of Paying Living Expenses: Not hard at all  Food Insecurity: No Food Insecurity   Worried About Charity fundraiser in the Last Year: Never true   [place] in the Last Year: Never true  Transportation Needs: No Transportation Needs   Lack of Transportation (Medical): No   Lack of Transportation (Non-Medical): No  Physical Activity: Sufficiently Active   Days of Exercise per Week: 5 days   Minutes of Exercise per Session: 30 min  Stress: No Stress Concern Present   Feeling of Stress : Not at all  Social Connections: Socially Integrated   Frequency of  Communication with Friends and Family: More than three times a week    Frequency of Social Gatherings with Friends and Family: More than three times a week   Attends Religious Services: More than 4 times per year   Active Member of [employer] or Organizations: Yes   Attends Music therapist: More than 4 times per year   Marital Status: Married    Allergies:  Allergies  Allergen Reactions   Codeine     constipation   Lactose Diarrhea   Sulfa Antibiotics Hives    Objective:    Vital Signs:   Temp:  [98 F (36.7 C)] 98 F (36.7 C) (02/07 0832) Pulse Rate:  [79-100] 100 (02/07 1600) Resp:  [14-36] 26 (02/07 1600) BP: (91-117)/(40-81) 102/50 (02/07 1600) SpO2:  [90 %-95 %] 93 % (02/07 1600) Weight:  [59 kg] 59 kg (02/07 0840)    Weight change: Filed Weights   08/30/2021 0840  Weight: 59 kg    Intake/Output:  No intake or output data in the 24 hours ending 08/17/2021 1634    Physical Exam    General:  Elderly male, appears acutely ill. HEENT: normal Neck: supple. JVP to ear. Carotids 2+ bilat; no bruits. No lymphadenopathy or thyromegaly appreciated. Cor: PMI nondisplaced. Regular rate & rhythm. + S3. No rubs, 2/6 SEM + 3/6 HSM Lungs: bibasilar rales, on 15L high flow O2 Abdomen: soft, nontender, + distended. No hepatosplenomegaly.  Extremities: no cyanosis, clubbing, rash, edema Neuro: alert & orientedx3, cranial nerves grossly intact. moves all 4 extremities w/o difficulty. Affect pleasant   Telemetry   Sinus/sinus tach 80s-90s, prolonged 1st degree AV block vs wenckebach AV block  EKG    ? Sinus with long 1st degree AVB, LAFB, RBBB  Labs   Basic Metabolic Panel: Recent Labs  Lab 09/09/2021 0840 08/17/2021 0858  NA 139 139  K 5.2* 5.2*  CL 108 113*  CO2 16*  --   GLUCOSE 161* 148*  BUN 65* 59*  CREATININE 3.15* 3.60*  CALCIUM 8.4*  --     Liver Function Tests: No results for input(s): AST, ALT, ALKPHOS, BILITOT, PROT, ALBUMIN in the last 168 hours. No results for input(s): LIPASE, AMYLASE in the last 168  hours. No results for input(s): AMMONIA in the last 168 hours.  CBC: Recent Labs  Lab 08/22/2021 0840 08/26/2021 0858  WBC 12.5*  --   HGB 10.2* 9.9*  HCT 31.8* 29.0*  MCV 96.4  --   PLT 137*  --     Cardiac Enzymes: No results for input(s): CKTOTAL, CKMB, CKMBINDEX, TROPONINI in the last 168 hours.  BNP: BNP (last 3 results) Recent Labs    08/28/2021 0840  BNP 2,826.8*    ProBNP (last 3 results) No results for input(s): PROBNP in the last 8760 hours.   CBG: No results for input(s): GLUCAP in the last 168 hours.  Coagulation Studies: No results for input(s): LABPROT, INR in the last 72 hours.   Imaging   DG Chest Portable 1 View  Result Date: 09/11/2021 CLINICAL DATA:  Shortness of breath EXAM: PORTABLE CHEST 1 VIEW COMPARISON:  Radiograph 05/17/2010 FINDINGS: Unchanged cardiomediastinal silhouette with prior median sternotomy and postsurgical changes of CABG. There are bibasilar airspace opacities and small, left greater than right pleural effusions. Diffuse mild interstitial opacities. No pneumothorax. Bilateral shoulder osteoarthritis. No acute osseous abnormality. IMPRESSION: Mild pulmonary edema with small bilateral pleural effusions, left greater than right, and bibasilar atelectasis. Electronically Signed   By: Ileene Patrick.D.  On: 09/03/2021 09:14   ECHOCARDIOGRAM COMPLETE  Result Date: 08/18/2021    ECHOCARDIOGRAM REPORT   Patient Name:   [name] Date of Exam: 08/26/2021 Medical Rec #:  [id number]      Height:       66.0 in Accession #:    [id number]     Weight:       130.0 lb Date of Birth:  05-18-1929       BSA:          1.665 m Patient Age:    52 years       BP:           91/68 mmHg Patient Gender: M              HR:           90 bpm. Exam Location:  Inpatient Procedure: 2D Echo STAT ECHO Indications:    NSTEMI  History:        Patient has prior history of Echocardiogram examinations, most                 recent 07/07/2019. Prior CABG, Arrythmias:RBBB; Risk                  Factors:Hypertension and Sleep Apnea.                 Aortic Valve: bioprosthetic valve is present in the aortic                 position.  Sonographer:    Johny Chess RDCS Referring Phys: [id number] [place]  1. Left ventricular ejection fraction, by estimation, is 40 to 45%. The left ventricle has mildly decreased function. The left ventricle demonstrates regional wall motion abnormalities (see scoring diagram/findings for description). Indeterminate diastolic filling due to E-A fusion. There is hypokinesis of the left ventricular, entire anterolateral wall and inferolateral wall.  2. Right ventricular systolic function is moderately reduced. The right ventricular size is mildly enlarged. There is mildly elevated pulmonary artery systolic pressure. The estimated right ventricular systolic pressure is 92.4 mmHg.  3. Left atrial size was severely dilated.  4. The mitral valve is degenerative. Moderate to severe mitral valve regurgitation. No evidence of mitral stenosis. Moderate mitral annular calcification.  5. Tricuspid valve regurgitation is mild to moderate.  6. The aortic valve has been repaired/replaced. Aortic valve regurgitation is mild to moderate. There is a bioprosthetic valve present in the aortic position. Aortic valve mean gradient measures 12.0 mmHg. Aortic valve Vmax measures 2.36 m/s.  7. The inferior vena cava is dilated in size with >50% respiratory variability, suggesting right atrial pressure of 8 mmHg. Comparison(s): Prior images reviewed side by side. The left ventricular function is significantly worse. The left ventricular wall motion abnormalities are new. Mitral insufficiency is worse. Aortic valve prosthesis gradients are lower due to decreased left ventricular stroke volume. FINDINGS  Left Ventricle: Left ventricular ejection fraction, by estimation, is 40 to 45%. The left ventricle has mildly decreased function. The left ventricle demonstrates regional  wall motion abnormalities. The left ventricular internal cavity size was normal in size. There is no left ventricular hypertrophy. Abnormal (paradoxical) septal motion consistent with post-operative status. Indeterminate diastolic filling due to E-A fusion.  LV Wall Scoring: The entire lateral wall is hypokinetic. Right Ventricle: The right ventricular size is mildly enlarged. No increase in right ventricular wall thickness. Right ventricular systolic function is moderately reduced. There is mildly elevated pulmonary artery systolic pressure. The  tricuspid regurgitant velocity is 3.03 m/s, and with an assumed right atrial pressure of 8 mmHg, the estimated right ventricular systolic pressure is 35.5 mmHg. Left Atrium: Left atrial size was severely dilated. Right Atrium: Right atrial size was normal in size. Pericardium: There is no evidence of pericardial effusion. Mitral Valve: The mitral valve is degenerative in appearance. Moderate mitral annular calcification. Moderate to severe mitral valve regurgitation. No evidence of mitral valve stenosis. Tricuspid Valve: The tricuspid valve is normal in structure. Tricuspid valve regurgitation is mild to moderate. Aortic Valve: The aortic valve has been repaired/replaced. Aortic valve regurgitation is mild to moderate. Aortic valve mean gradient measures 12.0 mmHg. Aortic valve peak gradient measures 22.3 mmHg. Aortic valve area, by VTI measures 1.22 cm. There is  a bioprosthetic valve present in the aortic position. Pulmonic Valve: The pulmonic valve was grossly normal. Pulmonic valve regurgitation is not visualized. Aorta: The aortic root and ascending aorta are structurally normal, with no evidence of dilitation. Venous: The inferior vena cava is dilated in size with greater than 50% respiratory variability, suggesting right atrial pressure of 8 mmHg. IAS/Shunts: No atrial level shunt detected by color flow Doppler.  LEFT VENTRICLE PLAX 2D LVIDd:         5.54 cm       Diastology LVIDs:         4.06 cm      LV e' lateral: 9.90 cm/s LV PW:         1.08 cm LV IVS:        1.10 cm LVOT diam:     1.90 cm LV SV:         53 LV SV Index:   32 LVOT Area:     2.84 cm  LV Volumes (MOD) LV vol d, MOD A4C: 116.0 ml LV vol s, MOD A4C: 67.6 ml LV SV MOD A4C:     116.0 ml RIGHT VENTRICLE RV Basal diam:  3.47 cm RV S prime:     5.66 cm/s TAPSE (M-mode): 1.0 cm LEFT ATRIUM              Index        RIGHT ATRIUM           Index LA diam:        5.10 cm  3.06 cm/m   RA Area:     14.20 cm LA Vol (A2C):   102.0 ml 61.25 ml/m  RA Volume:   34.50 ml  20.72 ml/m LA Vol (A4C):   85.5 ml  51.34 ml/m LA Biplane Vol: 92.8 ml  55.73 ml/m  AORTIC VALVE AV Area (Vmax):    1.08 cm AV Area (Vmean):   1.06 cm AV Area (VTI):     1.22 cm AV Vmax:           236.00 cm/s AV Vmean:          160.500 cm/s AV VTI:            0.438 m AV Peak Grad:      22.3 mmHg AV Mean Grad:      12.0 mmHg LVOT Vmax:         89.65 cm/s LVOT Vmean:        60.150 cm/s LVOT VTI:          0.188 m LVOT/AV VTI ratio: 0.43  AORTA Ao Root diam: 3.00 cm Ao Asc diam:  3.10 cm Ricky Peak grad:    83.5 mmHg    TRICUSPID  VALVE Ricky Mean grad:    50.0 mmHg    TR Peak grad:   36.7 mmHg Ricky Vmax:         457.00 cm/s  TR Vmax:        303.00 cm/s Ricky Vmean:        329.0 cm/s Ricky PISA:         2.26 cm     SHUNTS Ricky PISA Eff ROA: 20 mm       Systemic VTI:  0.19 m Ricky PISA Radius:  0.60 cm      Systemic Diam: 1.90 cm Mihai Croitoru MD Electronically signed by Sanda Klein MD Signature Date/Time: 08/29/2021/12:54:37 PM    Final      Medications:     Current Medications:  simvastatin  20 mg Oral QHS    Infusions:  heparin 700 Units/hr (09/13/2021 1036)      Patient Profile   88 y.o. male with history of CAD s/p CABG followed by PCI SVG to OM in 2008, AS s/p SAVR, PAD. Now presenting with acute respiratory failure with hypoxia in setting of acute systolic CHF and AKI.  Assessment/Plan   Acute systolic CHF: -EF previously 65-70% -Echo today  EF 40-45%, new wall motion abnormalities in anterolateral and inferolateral wall, RV moderately down, moderate to severe Ricky -HS troponin 7858>8502. BNP 2800 -? Late presentation MI with ischemic Ricky. LHC not a good option with Scr > 3 -Volume overloaded. Little UOP with 80 lasix IV. Rebolus 80 mg lasix IV and start lasix gtt at 10/hr.  -Worry about low-output HF. Initial HCO3 16. Lactic acid pending.  -No GDMT with soft BP  2. NSTEMI with known CAD: -S/p CABG in 1995 and PCI SVG to OM in 1998 -Troponin 7741>2878 -Received 324 aspirin this am. Now on heparin gtt.  -On statin  3. Moderate to severe Ricky: -? Ischemic -WMA in Cx territory on echo  4. Aortic valve stenosis: -S/p SAVR -mean gradient 12 mmHg on echo today  5. Acute respiratory failure with hypoxia: -Now on 15L high flow [place] -Likely d/t acute CHF  6. AKI on CKD: -Scr baseline 1.4-1.6, 3.15 today -Suspect cardiorenal   Length of Stay: 0  FINCH, LINDSAY N, PA-C  09/11/2021, 4:34 PM  Advanced Heart Failure Team Pager [phone] (M-F; 7a - 5p)  Please contact [facility] for night-coverage after hours (4p -7a ) and weekends on [email or web address]  Patient seen with PA, agree with the above note .  History as described above.  Patient has not been seen by cardiology since 2020.  He reports of 2 months of progressive exertional dyspnea.  However, for the last 1-2 weeks, he has had chest pressure that has been fairly constant as well as dyspnea walking around his house. +Orthopnea and PND.  He came to the ER today, CXR with pulmonary edema and now on 15 L HFNC => NRBM.  Creatinine 3.1 currently, up from 1.7 at last check in 6/22.  He had Lasix 80 mg IV x 1 with about 150 cc out.   Echo was done, showing EF 45% range with lateral wall hypokinesis; moderate-severe Ricky (concern for infarct-related Ricky), normal RV, bioprosthetic aortic valve with mean gradient 12 mmHg.   HS-TnI 1558 => 1348  Rhythm appears to be type 1 2nd degree AVB,  reviewed with EP.   General: NRBM Neck: JVP 14+, no thyromegaly or thyroid nodule.  Lungs: Crackles at bases. CV: Nondisplaced PMI.  Heart regular S1/S2, no S3/S4, 2/6 HSM apex.  No peripheral edema.  Unable to palpate pedal pulses.  Abdomen: Soft, nontender, no hepatosplenomegaly, no distention.  Skin: Intact without lesions or rashes.  Neurologic: Alert and oriented x 3.  Psych: Normal affect. Extremities: No clubbing or cyanosis.  HEENT: Normal.   1. Acute heart failure with mid range EF: Echo today with EF 45% range with lateral wall hypokinesis; possibly severe Ricky (concern for infarct-related Ricky), normal RV, bioprosthetic aortic valve with mean gradient 12 mmHg.  The fall in EF and Ricky are new from last prior echo.  I am concerned that he had an out of hospital MI (symptoms ongoing x 1-2 weeks) affecting lateral wall with resultant ischemic mitral regurgitation.  Initial CO2 16, pending lactate.  SBP 90s-100s.  Concern for impending cardiogenic shock. Situation complicated by suspected AKI on CKD stage 3 (creatinine 3.1, last prior in 6/22 with creatinine 1.7).  - Place CVL, follow CVP and co-ox.  - With concern for cardiogenic shock and severe Ricky, will start on milrinone 0.25 mcg/kg/min.  - Lasix 80 mg IV x 1 bolus and 10 mg/hr gtt.  - Eventual RHC . 2. Mitral regurgitation: I reviewed echo, Ricky may be severe.  Suspect infarct-related Ricky with lateral wall hypokinesis.  Murmur present on exam.  - Milrinone and Lasix as above.  - If we can stabilize him, may be option for Mitraclip in future.  3. Bioprosthetic aortic valve: S/p SAVR.  Mean gradient 12 mmHg on echo this admission.  4. CAD: H/o CABG in 1995 and PCI to SVG-OM after that.  No recent cath.  Patient has had fairly constant chest heaviness for 1-2 weeks.  This may be due to volume overload, but I am concerned he has had an out of hospital MI with new lateral WMA and suspected infarct-related Ricky.  HS-TnI 1558 => 1348.  Suspect not  acute NSTEMI, more likely a number of days ago.  - Not candidate for coronary angiography at this time with AKI and suspicion that he had a subacute event (several days ago).  - Atorvastatin 80  - heparin gtt for now. - ASA 81 5. AKI on ?CKD stage 3: Creatinine 3.1, most recent prior in 6/22 was 1.7.  ?AKI due to cardiorenal syndrome in the setting of cardiogenic shock.  He is volume overloaded.  - Hopefully, cardiac output support with milrinone will help renal function.  6. PAD: h/o peripheral intervention.  7. Acute hypoxemic respiratory failure: CXR with pulmonary edema.  Now on NRBM.  - CCM following.   This patient is critically ill with developing cardiogenic shock, AKI, and acute hypoxemic respiratory failure.  At 88 years old, I think that prognosis is very guarded. We discussed code status.  He wants to be full code for now.  He is willing to be intubated if needed.  If he does not turn around rapidly, will likely need to involve our palliative care service for goals of care discussion.    Loralie Champagne 08/30/2021 5:58 PM

## 2021-08-23 NOTE — Consult Note (Signed)
NAME:  [name], MRN:  [id number], DOB:  July 15, 1929, LOS: 0 ADMISSION DATE:  09/05/2021, CONSULTATION DATE:  09/04/2021 REFERRING MD:  Christ Kick, MD CHIEF COMPLAINT:  Shortness of breath, chest pressure   History of Present Illness:  [name] is a 88 year old man living with significant cardiac history listed below who presents by EMS to ED for evaluation of chest pressure and shortness of breath. His wife and son are bedside. The patient walked to the mailbox yesterday and had sudden onset of chest pressure. Pain did not go away with rest.. The pressure was constant. He had radiation to his right and left arm. He wife called EMS this morning because pain was not resolving. Pain was relieved when EMS started oxygen.  Review of Systems  Constitutional:  Negative for chills and fever.  HENT:  Negative for hearing loss and sore throat.   Eyes:  Negative for blurred vision and double vision.  Respiratory:  Positive for cough and shortness of breath.   Cardiovascular:  Positive for orthopnea. Negative for leg swelling.       + Chest pressure  Gastrointestinal:  Positive for constipation (chronic). Negative for abdominal pain.  Genitourinary:  Positive for frequency. Negative for dysuria.  Musculoskeletal:  Positive for back pain (chronic). Negative for falls.  Neurological:  Negative for dizziness and headaches.  Psychiatric/Behavioral:  Negative for hallucinations and substance abuse.    Pertinent  Medical History  HTN, HLD, BPH, lumbar spinal stenosis, CAD s/p CABG in 1995, s/p PCI/stenting to the SVG-OM in 2008, aortic stenosis status post bioprosthetic aortic valve replacement 2011 with known stenosis, peripheral vascular disease s/p remote L lower extremity stenting, renal artery stenosis   Significant Hospital Events: Including procedures, antibiotic start and stop dates in addition to other pertinent events   2/7 Consulted for SOB and high oxygen requirement  Interim History  / Subjective:    Objective   Blood pressure (!) 101/50, pulse 90, temperature 98 F (36.7 C), temperature source Oral, resp. rate (!) 26, height 5\' 6"  (1.676 m), weight 59 kg, SpO2 93 %.       No intake or output data in the 24 hours ending 09/08/2021 1410 Filed Weights   08/18/2021 0840  Weight: 59 kg    Examination: General: Thin elderly man , speaking in complete sentences , on 15L [place] HENT: NCAT, MMM, crusted lips, pale conjunctiva, antiicteric sclerae.   Lungs: bilateral rales at bases, inspiratory wheeze , no rhonchi  Cardiovascular: RRR, systolic murmur , no JVD, no lower extremity edema Abdomen: soft , NT, BS+ Extremities: No deformity or erythema Neuro: Alert and oriented to person    Resolved Hospital Problem list     Assessment & Plan:   Acute respiratory failure with hypoxia from acute on chronic heart failure exacerbation secondary to acute myocardial infarction Hx of Diastolic Heart Failure P: - Elevated troponin with new wall motion abnormalities on Echo - Patient has received Asprin and started on Heparin.  - Cardiology consulting - Patient has received IV lasix, continue diuresis. - Continue Statin - Continue supplemental oxygen to maintain oxygen saturation greater than 88%. Comfortable on 15L [place], and treatment for reversible cause has been started. Patient's oxygen requirement should continue to improve and recommend admission to progressive unit for close monitoring.   Anion Gap Metabolic Acidosis P: - Expect decreased perfusion is setting of MI, treat problem above. - Check Lactic acid  AKI on CKD stage 3B P: -Baseline Cr 1.4 to 1.6 -  In setting of low perfusion state. Treat problem above. - Trend Kidney Function - Monitor I/O's, hx of BPH so if not output may need further evaluation and foley.   Hyperkalemia - In setting of AKI  - BMP ordered, follow up and treat as needed  Best Practice (right click and "Reselect all SmartList Selections"  daily)   Diet/type: NPO DVT prophylaxis: systemic heparin GI prophylaxis: N/A Lines: N/A Foley:  N/A Code Status:  full code Last date of multidisciplinary goals of care discussion [x]   Labs   CBC: Recent Labs  Lab 08/29/2021 0840 08/27/2021 0858  WBC 12.5*  --   HGB 10.2* 9.9*  HCT 31.8* 29.0*  MCV 96.4  --   PLT 137*  --     Basic Metabolic Panel: Recent Labs  Lab 08/18/2021 0840 09/02/2021 0858  NA 139 139  K 5.2* 5.2*  CL 108 113*  CO2 16*  --   GLUCOSE 161* 148*  BUN 65* 59*  CREATININE 3.15* 3.60*  CALCIUM 8.4*  --    GFR: Estimated Creatinine Clearance: 11.4 mL/min (A) (by C-G formula based on SCr of 3.6 mg/dL (H)). Recent Labs  Lab 09/11/2021 0840  WBC 12.5*    Liver Function Tests: No results for input(s): AST, ALT, ALKPHOS, BILITOT, PROT, ALBUMIN in the last 168 hours. No results for input(s): LIPASE, AMYLASE in the last 168 hours. No results for input(s): AMMONIA in the last 168 hours.  ABG    Component Value Date/Time   PHART 7.316 (L) 04/18/2010 1708   PCO2ART 38.0 04/18/2010 1708   PO2ART 84.0 04/18/2010 1708   HCO3 19.4 (L) 04/18/2010 1708   TCO2 20 (L) 09/05/2021 0858   ACIDBASEDEF 6.0 (H) 04/18/2010 1708   O2SAT 95.0 04/18/2010 1708     Coagulation Profile: No results for input(s): INR, PROTIME in the last 168 hours.  Cardiac Enzymes: No results for input(s): CKTOTAL, CKMB, CKMBINDEX, TROPONINI in the last 168 hours.  HbA1C: Hgb A1c MFr Bld  Date/Time Value Ref Range Status  03/11/2014 09:17 AM 5.7 4.6 - 6.5 % Final    Comment:    Glycemic Control Guidelines for People with Diabetes:Non Diabetic:  <6%Goal of Therapy: <7%Additional Action Suggested:  >8%   04/14/2010 11:33 AM  <5.7 % Final   5.5 (NOTE)                                                                       According to the ADA Clinical Practice Recommendations for 2011, when HbA1c is used as a screening test:   >=6.5%   Diagnostic of Diabetes Mellitus           (if  abnormal result  is confirmed)  5.7-6.4%   Increased risk of developing Diabetes Mellitus  References:Diagnosis and Classification of Diabetes Mellitus,Diabetes ZOXW,9604,54(UJWJX 1):S62-S69 and Standards of Medical Care in         Diabetes - 2011,Diabetes BJYN,8295,62  (Suppl 1):S11-S61.    CBG: No results for input(s): GLUCAP in the last 168 hours.  Review of Systems:    Constitutional:  Negative for chills and fever.  HENT:  Negative for hearing loss and sore throat.   Eyes:  Negative for blurred vision and double vision.  Respiratory:  Positive  for cough and shortness of breath.   Cardiovascular:  Positive for orthopnea. Negative for leg swelling.       + Chest pressure  Gastrointestinal:  Positive for constipation (chronic). Negative for abdominal pain.  Genitourinary:  Positive for frequency. Negative for dysuria.  Musculoskeletal:  Positive for back pain (chronic). Negative for falls.  Neurological:  Negative for dizziness and headaches.  Psychiatric/Behavioral:  Negative for hallucinations and substance abuse.    Past Medical History:  He,  has a past medical history of Aortic valve disorders, Atherosclerosis of renal artery (HCC), Bilateral renal artery stenosis (HCC), BPH (benign prostatic hypertrophy), Carotid artery disease ([place]), Cerebrovascular disease, unspecified, Coronary atherosclerosis of unspecified type of vessel, native or graft, History of cystoscopy, History of echocardiogram, Malignant neoplasm of bladder, part unspecified, Melanoma in situ of back ([place]) (2012), Old myocardial infarction, Other and unspecified hyperlipidemia, Peripheral vascular disease, unspecified ([place]), Postsurgical aortocoronary bypass status, Postsurgical percutaneous transluminal coronary angioplasty status, Scarlet fever, and Unspecified sleep apnea.   Surgical History:   Past Surgical History:  Procedure Laterality Date   CORONARY ARTERY BYPASS GRAFT     CYSTOSCOPY     w/ bladder biopsy    MELANOMA EXCISION  2012   left scapular  area - clean margins.    Percutaneous transluminal coronary angioplasty     redo median sternotomy     extracorporeal circulation, aortic valve replacement using a 23-mm Mitroflow aortic pericardial heart valve.  Surgeon- Gilford Raid, MD 04-18-10     Social History:   reports that he has never smoked. He has never used smokeless tobacco. He reports current alcohol use. He reports that he does not use drugs.   Family History:  His family history includes Cancer in an other family member; Coronary artery disease in his father and mother; Diabetes in his mother; Heart attack in his father; Heart disease in his father and mother; Hyperlipidemia in his mother; Hypertension in his mother; Stroke in his mother; Sudden death in his father. There is no history of Birth defects.   Allergies Allergies  Allergen Reactions   Codeine     constipation   Lactose Diarrhea   Sulfa Antibiotics Hives      Home Medications  Prior to Admission medications   Medication Sig Start Date End Date Taking? Authorizing Provider  amLODipine (NORVASC) 10 MG tablet TAKE ONE TABLET BY MOUTH DAILY Patient taking differently: Take 10 mg by mouth daily. 07/31/21  Yes Plotnikov, Evie Lacks, MD  Ascorbic Acid (VITAMIN C PO) Take 1 capsule by mouth daily.   Yes [provider]  aspirin 325 MG tablet Take 325 mg by mouth every 6 (six) hours as needed for mild pain or headache.   Yes [provider]  aspirin EC 81 MG tablet Take 1 tablet (81 mg total) by mouth daily. 09/26/17  Yes Weaver, Scott T, PA-C  B Complex-C (SUPER B COMPLEX PO) Take 1 tablet by mouth daily.   Yes [provider]  cholecalciferol (VITAMIN D) 1000 UNITS tablet Take 1,000 Units by mouth daily.   Yes [provider]  diclofenac Sodium (VOLTAREN) 1 % GEL Apply 4 g topically 4 (four) times daily as needed (pain).   Yes [provider]  fish oil-omega-3 fatty acids 1000  MG capsule Take 1 g by mouth 2 (two) times daily.   Yes [provider]  ibuprofen (ADVIL) 200 MG tablet Take 200-400 mg by mouth every 6 (six) hours as needed for mild pain or headache.  Yes [provider]  lisinopril (ZESTRIL) 20 MG tablet Take 1 tablet (20 mg total) by mouth daily. 08/25/20  Yes Plotnikov, Evie Lacks, MD  Magnesium 400 MG TABS Take 400 mg by mouth daily.   Yes [provider]  MELATONIN PO Take 1 tablet by mouth at bedtime.   Yes [provider]  metoprolol succinate (TOPROL-XL) 25 MG 24 hr tablet Take 1 tablet (25 mg total) by mouth in the morning and at bedtime. 08/25/20  Yes Plotnikov, Evie Lacks, MD  multivitamin [facility]) tablet Take 1 tablet by mouth daily.   Yes [provider]  nitroGLYCERIN (NITROSTAT) 0.4 MG SL tablet Place 1 tablet (0.4 mg total) under the tongue every 5 (five) minutes as needed for chest pain (Max 3 doses within 15 minutes call 911). 08/12/19  Yes Plotnikov, Evie Lacks, MD  simvastatin (ZOCOR) 20 MG tablet Take 1 tablet (20 mg total) by mouth at bedtime. 08/25/20  Yes Plotnikov, Evie Lacks, MD  tamsulosin (FLOMAX) 0.4 MG CAPS capsule Take 2 capsules (0.8 mg total) by mouth daily. 08/25/20  Yes Plotnikov, Evie Lacks, MD  traMADol (ULTRAM) 50 MG tablet Take 50 mg by mouth every 6 (six) hours as needed for moderate pain. 05/09/21  Yes [provider]  predniSONE (DELTASONE) 5 MG tablet Take 2 tablets (10 mg total) by mouth daily with breakfast. Patient not taking: Reported on 09/02/2021 04/12/21   Plotnikov, Evie Lacks, MD  solifenacin (VESICARE) 5 MG tablet Take 1 tablet (5 mg total) by mouth daily. 12/28/20   Biagio Borg, MD     Tamsen Snider, MD PGY3 Internal Medicine [phone]

## 2021-08-23 NOTE — Assessment & Plan Note (Signed)
-  Hold BP meds for now given unstable rhythm

## 2021-08-23 NOTE — ED Notes (Signed)
Dr. Irish Lack at bedside

## 2021-08-23 NOTE — Consult Note (Addendum)
Cardiology Consultation:   Patient ID: [name] MRN: [id number]; DOB: Dec 14, 1928  Admit date: 09/02/2021 Date of Consult: 08/31/2021  PCP:  Cassandria Anger, MD   [facility] Prov[facility] Cardiologist:  Sherren Mocha, MD     Patient Profile:   [name] is a 88 y.o. male with a hx of coronary artery disease status post CABG in 1995, status post PCI/stenting to the SVG-OM in 2008, aortic stenosis status post bioprosthetic aortic valve replacement in 2011 with known stenosis, peripheral vascular disease s/p remote L lower extremity stenting, renal artery stenosis who is being seen 09/11/2021 for the evaluation of Chest pressure and shortness of breath at the request of Dr. Eulis Foster.  He was doing well when last seen by Dr. Burt Knack 06/2019.   Last echo 07/07/2019 with LVEF of 65-70% and grade II DD. Aortic valve mean gradient measures 22.0  mmHg. Aortic valve peak gradient measures 41.3 mmHg. Aortic valve area, by  VTI measures 1.45 cm. Felt overall stable.   History of Present Illness:   [name] presented for evaluation of chest pressure and shortness of breath.  Wife and son are present at bedside.  Patient lives with wife.  He reported approximately 2 months history of chest pressure while laying down.  This gets better with movement.  About a week ago his symptoms worsen and noted intermittent chest pressure with activity as well but more so with laying down.  Yesterday he could not come back from mailbox due to severe shortness of breath and chest pressure.  He continued to have substernal chest pressure with right arm radiation overnight.  He needed to keep his bed elevated.  Sitting up makes his symptoms better.  Patient denies dizziness, lower extremity edema, syncope, palpitation or melena.  Reports compliance with medication.  No recent fever or viral syndrome.  Patient wants to be a full code.  Past Medical History:  Diagnosis Date   Aortic valve disorders    Echo  3/19: Mild LVH, EF 65-70, dynamic obstruction during Valsalva in mid cavity with mid cavity obliteration (peak velocity 200 cm/s, peak gradient 16 mmHg), normal wall motion, grade 1 diastolic dysfunction, AVR with mild stenosis (mean gradient 32, peak 56), MAC, mild to moderate MR, mild LAE   Atherosclerosis of renal artery (HCC)    Bilateral renal artery stenosis (HCC)    documented at the time of angiography by Dr. Albertine Patricia   BPH (benign prostatic hypertrophy)    Carotid artery disease ([place])    Carotid US 3/19: Bilateral ICA 40-59, follow-up 1 year   Cerebrovascular disease, unspecified    Coronary atherosclerosis of unspecified type of vessel, native or graft    History of cystoscopy    w/ bladder biopsy   History of echocardiogram    Echo 3/19: mild LVH, EF 65-70, dynamic obstruction during Valsalva with mid cavity obliteration (peak velocity of 200 cm/sec - peak gradient 16 mmHg), no RWMA, Gr 1 DD, s/p AVR, mild AS (mean 32, peak 56), MAC, mild to mod MR, mild LAE   Malignant neoplasm of bladder, part unspecified    Melanoma in situ of back ([place]) 2012   area of left scapula   Old myocardial infarction    Other and unspecified hyperlipidemia    Peripheral vascular disease, unspecified ([place])    history of stent left lower extremity placed by Dr. Albertine Patricia   Postsurgical aortocoronary bypass status    Postsurgical percutaneous transluminal coronary angioplasty status    Scar[place] fever  Unspecified sleep apnea     Past Surgical History:  Procedure Laterality Date   CORONARY ARTERY BYPASS GRAFT     CYSTOSCOPY     w/ bladder biopsy   MELANOMA EXCISION  2012   left scapular  area - clean margins.    Percutaneous transluminal coronary angioplasty     redo median sternotomy     extracorporeal circulation, aortic valve replacement using a 23-mm Mitroflow aortic pericardial heart valve.  Surgeon- Gilford Raid, MD 04-18-10     Inpatient Medications: Scheduled Meds:  Continuous  Infusions:  heparin 700 Units/hr (09/09/2021 1036)   PRN Meds:   Allergies:    Allergies  Allergen Reactions   Codeine     constipation   Lactose Diarrhea   Sulfa Antibiotics Hives    Social History:   Social History   Socioeconomic History   Marital status: Married    Spouse name: Not on file   Number of children: 3   Years of education: 17   Highest education level: Not on file  Occupational History   Occupation: Lobbyist: RETIRED   Occupation: retired  Tobacco Use   Smoking status: Never   Smokeless tobacco: Never  Scientific laboratory technician Use: Never used  Substance and Sexual Activity   Alcohol use: Yes    Comment: rare   Drug use: No   Sexual activity: Not Currently  Other Topics Concern   Not on file  Social History Narrative   HSG, [employer], did some graduate work. Married '52. 1 son - '61; 2 daughters -'13, '63: grandchildren 54; great-grands 26. 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 Active gardner, Designer, jewellery. good marriage.   Social Determinants of Health   Financial Resource Strain: Low Risk    Difficulty of Paying Living Expenses: Not hard at all  Food Insecurity: No Food Insecurity   Worried About Charity fundraiser in the Last Year: Never true   [place] in the Last Year: Never true  Transportation Needs: No Transportation Needs   Lack of Transportation (Medical): No   Lack of Transportation (Non-Medical): No  Physical Activity: Sufficiently Active   Days of Exercise per Week: 5 days   Minutes of Exercise per Session: 30 min  Stress: No Stress Concern Present   Feeling of Stress : Not at all  Social Connections: Socially Integrated   Frequency of Communication with Friends and Family: More than three times a week   Frequency of Social Gatherings with Friends and Family: More than three times a week   Attends Religious Services: More than 4 times per year   Active Member of [employer] or Organizations: Yes    Attends Music therapist: More than 4 times per year   Marital Status: Married  Human resources officer Violence: Not At Risk   Fear of Current or Ex-Partner: No   Emotionally Abused: No   Physically Abused: No   Sexually Abused: No    Family History:    Family History  Problem Relation Age of Onset   Hypertension Mother    Diabetes Mother    Hyperlipidemia Mother    Coronary artery disease Mother    Heart disease Mother        CAD, cardiomyopathy   Stroke Mother    Coronary artery disease Father    Sudden death Father        1 wk post MI   Heart attack Father  Heart disease Father        CAD/MI-sudden death   Cancer Other        prostate   Birth defects Neg Hx      ROS:  Please see the history of present illness.  All other ROS reviewed and negative.     Physical Exam/Data:   Vitals:   09/12/2021 1100 09/11/2021 1115 09/06/2021 1124 09/01/2021 1125  BP: (!) 104/43 (!) 91/46    Pulse: 81 86 92 80  Resp: (!) 28 (!) 29 (!) 22 14  Temp:      TempSrc:      SpO2: 92% 93% 94% 95%  Weight:      Height:       No intake or output data in the 24 hours ending 08/25/2021 1132 Last 3 Weights 08/28/2021 07/27/2021 02/22/2021  Weight (lbs) 130 lb 132 lb 126 lb 6.4 oz  Weight (kg) 58.968 kg 59.875 kg 57.335 kg     Body mass index is 20.98 kg/m.  General: Ill-appearing elderly male requiring high flow oxygen HEENT: normal Neck: no JVD Vascular: No carotid bruits; Distal pulses 2+ bilaterally Cardiac:  normal S1, S2; irregular; no murmur  Lungs: Rales on exam  abd: soft, nontender, no hepatomegaly  Ext: no edema Musculoskeletal:  No deformities, BUE and BLE strength normal and equal Skin: warm and dry  Neuro:  CNs 2-12 intact, no focal abnormalities noted Psych:  Normal affect   EKG:  The EKG was personally reviewed and demonstrates: Sinus rhythm, right bundle branch block, nonspecific T wave/ST abnormality Telemetry:  Telemetry was personally reviewed and demonstrates:  Sinus rhythm, bundle branch block, loss of atrial kick  Relevant CV Studies:  Echo 07/07/2019 1. Left ventricular ejection fraction, by visual estimation, is 65 to  70%. The left ventricle has hyperdynamic function. There is mildly  increased left ventricular hypertrophy.   2. Elevated left atrial pressure.   3. Left ventricular diastolic parameters are consistent with Grade II  diastolic dysfunction (pseudonormalization).   4. The left ventricle has no regional wall motion abnormalities.   5. Global right ventricle has normal systolic function.The right  ventricular size is normal. No increase in right ventricular wall  thickness.   6. Left atrial size was severely dilated.   7. Right atrial size was normal.   8. The mitral valve is normal in structure. Moderate mitral valve  regurgitation. No evidence of mitral stenosis.   9. The tricuspid valve is normal in structure. Tricuspid valve  regurgitation is mild.  10. Bioprosthetic aortic valve is well seated and has moderate  degenerative changes.  11. Aortic valve regurgitation is not visualized. Moderate aortic valve  stenosis.  12. The pulmonic valve was normal in structure. Pulmonic valve  regurgitation is trivial.  13. Normal pulmonary artery systolic pressure.  14. The inferior vena cava is normal in size with greater than 50%  respiratory variability, suggesting right atrial pressure of 3 mmHg.  15. Bioprosthetic aortic valve gradients are lower, at least in part due  to lower cardiac output (although the dimensionless obstructive index and  the calculated aortic valve area have also improved slightly). Otherwise  little change since 10/05/2017.  16. Prior images reviewed side by side.   Laboratory Data:  High Sensitivity Troponin:   Recent Labs  Lab 09/09/2021 0840  TROPONINIHS 1,558*     Chemistry Recent Labs  Lab 08/27/2021 0840 08/18/2021 0858  NA 139 139  K 5.2* 5.2*  CL 108 113*  CO2 16*  --  GLUCOSE 161*  148*  BUN 65* 59*  CREATININE 3.15* 3.60*  CALCIUM 8.4*  --   GFRNONAA 18*  --   ANIONGAP 15  --      Hematology Recent Labs  Lab 08/29/2021 0840 09/09/2021 0858  WBC 12.5*  --   RBC 3.30*  --   HGB 10.2* 9.9*  HCT 31.8* 29.0*  MCV 96.4  --   MCH 30.9  --   MCHC 32.1  --   RDW 13.9  --   PLT 137*  --    Radiology/Studies:  DG Chest Portable 1 View  Result Date: 08/31/2021 CLINICAL DATA:  Shortness of breath EXAM: PORTABLE CHEST 1 VIEW COMPARISON:  Radiograph 05/17/2010 FINDINGS: Unchanged cardiomediastinal silhouette with prior median sternotomy and postsurgical changes of CABG. There are bibasilar airspace opacities and small, left greater than right pleural effusions. Diffuse mild interstitial opacities. No pneumothorax. Bilateral shoulder osteoarthritis. No acute osseous abnormality. IMPRESSION: Mild pulmonary edema with small bilateral pleural effusions, left greater than right, and bibasilar atelectasis. Electronically Signed   By: Maurine Simmering M.D.   On: 08/26/2021 09:14     Assessment and Plan:   Elevated troponin Pulmonary edema Rhythm issue -Presented with few months history of shortness of breath and chest pressure, initially with laying down now occurring with exertion.  He could not walk yesterday due to shortness of breath and chest pressure.  He thinks sitting up makes his symptoms better.  Troponin 1500.  EKG is abnormal but he has chronic right bundle blanch block.  Suspect underlying issue is rhythm.  Questionable third-degree block or loss of atrial kick. -Patient wants to be a full code. -Continue IV heparin -Dr. Irish Lack to see -Get stat echocardiogram  -Patient is currently requiring high flow oxygen.  Likely low output state.  Will benefit from critical care team evaluation.  4.  CAD s/p CABG and graft stenting -Continue heparin -Cycle troponin -Continue aspirin 81 mg daily and statin  5.  Hypertension -Blood pressure soft -Hold home  antihypertensive  6.  AKI -Creatinine greater than 3.  It was normal in 2021. -Follow closely  7. History of bioprosthetic aortic valve replacement - Echo 06/2019: Aortic valve mean gradient measures 22.0  mmHg. Aortic valve peak gradient measures 41.3 mmHg. Aortic valve area, by  VTI measures 1.45 cm. Felt overall stable.    Risk Assessment/Risk Scores:     TIMI Risk Score for Unstable Angina or Non-ST Elevation MI:   The patient's TIMI risk score is 6, which indicates a 41% risk of all cause mortality, new or recurrent myocardial infarction or need for urgent revascularization in the next 14 days.          For questions or updates, please contact [place] Please consult [email or web address] for contact info under    SignedLeanor Kail, PA  09/07/2021 11:32 AM   I have examined the patient and reviewed assessment and plan and discussed with patient.  Agree with above as stated.    Came to see the patient acutely as he is on high flow oxygen.  He has been having worsening shortness of breath in a typical pattern for heart failure.  It is worse when he lies flat and better when he sits up.  He felt much better as soon as supplemental oxygen was administered.  On exam, he appears comfortable with the high flow oxygen.  He is sitting up.  Bibasilar crackles noted on exam.  Normal heart rate with irregular rhythm.  Soft  systolic murmur.  Mild lower extremity edema.  Telemetry suspicious for complete heart block in some strips, but normal ventricular rate makes this less likely.  Prolonged first degree block is a possibility.  Will have EP look at strips.  I personally reviewed the echocardiogram.  His ejection fraction appears to be in the low normal range but is less vigorous than it was in 2021.  He does have significant mitral regurgitation which may be somewhat more significant.  Mild troponin elevation in the setting of volume overload.  Creatinine increased, likely due to  hypoperfusion of the kidneys due to less forward flow from mitral regurgitation and rhythm disturbance.  Will cautiously diurese to see if this helps his respiratory status.  If he has issues with hypotension, will need right heart cath and we will get formal consult from the heart failure service.  We will have to follow creatinine closely with diuresis.  CRITICAL CARE Performed by: Larae Grooms   Total critical care time: 50 minutes  Critical care time was exclusive of separately billable procedures and treating other patients.  Critical care was necessary to treat or prevent imminent or life-threatening deterioration.  Critical care was time spent personally by me on the following activities: development of treatment plan with patient and/or surrogate as well as nursing, discussions with consultants, evaluation of patient's response to treatment, examination of patient, obtaining history from patient or surrogate, ordering and performing treatments and interventions, ordering and review of laboratory studies, ordering and review of radiographic studies, pulse oximetry and re-evaluation of patient's condition.   Larae Grooms   No urine output 45 minutes after 80 mg IV Lasix given.  Still on 15 L O2/[place]. Respiratory status is ok now but needs to be watched closely.  He wants to be a full code.  Quite active still despite age.   Jettie Booze, MD

## 2021-08-23 NOTE — ED Notes (Signed)
Pt placed on [place] device 15L

## 2021-08-24 LAB — LACTIC ACID, PLASMA: Lactic Acid, Venous: 2.2 mmol/L (ref 0.5–1.9)

## 2021-08-24 LAB — BASIC METABOLIC PANEL
Anion gap: 14 (ref 5–15)
Anion gap: 17 — ABNORMAL HIGH (ref 5–15)
BUN: 75 mg/dL — ABNORMAL HIGH (ref 8–23)
BUN: 78 mg/dL — ABNORMAL HIGH (ref 8–23)
CO2: 16 mmol/L — ABNORMAL LOW (ref 22–32)
CO2: 14 mmol/L — ABNORMAL LOW (ref 22–32)
Calcium: 8 mg/dL — ABNORMAL LOW (ref 8.9–10.3)
Calcium: 8 mg/dL — ABNORMAL LOW (ref 8.9–10.3)
Chloride: 110 mmol/L (ref 98–111)
Chloride: 107 mmol/L (ref 98–111)
Creatinine, Ser: 3.36 mg/dL — ABNORMAL HIGH (ref 0.61–1.24)
Creatinine, Ser: 3.66 mg/dL — ABNORMAL HIGH (ref 0.61–1.24)
GFR, Estimated: 17 mL/min — ABNORMAL LOW (ref >60)
GFR, Estimated: 15 mL/min — ABNORMAL LOW (ref >60)
Glucose, Bld: 118 mg/dL — ABNORMAL HIGH (ref 70–99)
Glucose, Bld: 130 mg/dL — ABNORMAL HIGH (ref 70–99)
Potassium: 3.9 mmol/L (ref 3.5–5.1)
Potassium: 4.1 mmol/L (ref 3.5–5.1)
Sodium: 140 mmol/L (ref 135–145)
Sodium: 138 mmol/L (ref 135–145)

## 2021-08-24 LAB — TSH: TSH: 3.796 u[IU]/mL (ref 0.350–4.500)

## 2021-08-24 LAB — CBC
HCT: 29.6 % — ABNORMAL LOW (ref 39.0–52.0)
HCT: 30.3 % — ABNORMAL LOW (ref 39.0–52.0)
Hemoglobin: 10 g/dL — ABNORMAL LOW (ref 13.0–17.0)
Hemoglobin: 9.8 g/dL — ABNORMAL LOW (ref 13.0–17.0)
MCH: 31.6 pg (ref 26.0–34.0)
MCH: 30.9 pg (ref 26.0–34.0)
MCHC: 33.8 g/dL (ref 30.0–36.0)
MCHC: 32.3 g/dL (ref 30.0–36.0)
MCV: 93.7 fL (ref 80.0–100.0)
MCV: 95.6 fL (ref 80.0–100.0)
Platelets: 145 10*3/uL — ABNORMAL LOW (ref 150–400)
Platelets: 152 10*3/uL (ref 150–400)
RBC: 3.16 MIL/uL — ABNORMAL LOW (ref 4.22–5.81)
RBC: 3.17 MIL/uL — ABNORMAL LOW (ref 4.22–5.81)
RDW: 13.6 % (ref 11.5–15.5)
RDW: 13.5 % (ref 11.5–15.5)
WBC: 10.5 10*3/uL (ref 4.0–10.5)
WBC: 11.8 10*3/uL — ABNORMAL HIGH (ref 4.0–10.5)
nRBC: 0 % (ref 0.0–0.2)
nRBC: 0 % (ref 0.0–0.2)

## 2021-08-24 LAB — COOXEMETRY PANEL
Carboxyhemoglobin: 1.2 % (ref 0.5–1.5)
Methemoglobin: 0.8 % (ref 0.0–1.5)
O2 Saturation: 85.6 %
Total hemoglobin: 10 g/dL — ABNORMAL LOW (ref 12.0–16.0)

## 2021-08-24 LAB — LIPID PANEL
Cholesterol: 128 mg/dL (ref 0–200)
HDL: 44 mg/dL (ref >40)
LDL Cholesterol: 73 mg/dL (ref 0–99)
Total CHOL/HDL Ratio: 2.9 RATIO
Triglycerides: 55 mg/dL (ref <150)
VLDL: 11 mg/dL (ref 0–40)

## 2021-08-24 LAB — BRAIN NATRIURETIC PEPTIDE: B Natriuretic Peptide: 2442.3 pg/mL — ABNORMAL HIGH (ref 0.0–100.0)

## 2021-08-24 LAB — HEMOGLOBIN A1C
Hgb A1c MFr Bld: 5.3 % (ref 4.8–5.6)
Mean Plasma Glucose: 105.41 mg/dL

## 2021-08-24 LAB — TROPONIN I (HIGH SENSITIVITY): Troponin I (High Sensitivity): 1594 ng/L (ref <18)

## 2021-08-24 MED ORDER — NOREPINEPHRINE 4 MG/250ML-% IV SOLN: 0.0000 ug/min | INTRAVENOUS | Status: DC

## 2021-08-24 MED ORDER — NOREPINEPHRINE 4 MG/250ML-% IV SOLN
INTRAVENOUS | Status: AC
  Administered 2021-08-24: 2 ug/min via INTRAVENOUS
  Filled 2021-08-24: qty 250

## 2021-08-24 MED ORDER — HYDROMORPHONE HCL 1 MG/ML IJ SOLN
INTRAMUSCULAR | Status: AC
  Filled 2021-08-24: qty 2

## 2021-08-24 MED ORDER — HYDROMORPHONE HCL 1 MG/ML IJ SOLN: 2.0000 mg | Freq: Once | INTRAMUSCULAR | Status: DC

## 2021-08-26 ENCOUNTER — Ambulatory Visit: Payer: Medicare Other | Admitting: Internal Medicine

## 2021-09-08 ENCOUNTER — Telehealth: Payer: Self-pay

## 2021-09-08 NOTE — Telephone Encounter (Signed)
Rep w/ Haig Prophet checking status of provider's signature for death certificate  Informed caller provider is out of office and will return Monday 09-12-2021

## 2021-09-08 NOTE — Telephone Encounter (Signed)
Deceased patient son Dellis Filbert calling in  Wanted to know if provider had received electronic death certificate for patient  Says patient body has been at [facility] for about 2 weeks waiting on death certificate signature  Says provider can ask to speak w/ head person at funeral home name Joe @ [phone]

## 2021-09-08 NOTE — Telephone Encounter (Signed)
Pt daughter is calling to see about Dr. Alain Marion signing off on the death Certificate.

## 2021-09-09 NOTE — Telephone Encounter (Signed)
I took care of with the other day and [employer] message to [name].  Thank you

## 2021-09-14 NOTE — ED Notes (Signed)
Agonal respirations noted at 0332, Dr Conley Canal at bedside, unable to get NIBP at [phone]

## 2021-09-14 NOTE — ED Notes (Signed)
Pt when talking with Dr Conley Canal expressed desire to be a dnr and follow with comfort care, Dr Conley Canal to consult with palliative care, no new orders at this time

## 2021-09-14 NOTE — Progress Notes (Signed)
Chaplain offered prayer and presence for family of deceased.  Chaplain conveyed Patient Placement Card to the daughter.  Her contact info is Bosie Helper [address] [place], [place]  [address]  [phone] home [phone]  cell  Arrangements incomplete at this time.   Letta Median Pager [phone]

## 2021-09-14 NOTE — ED Notes (Signed)
Spoke with Dr Conley Canal about concerns about pressure with diladid asked if he wanted to go over max dosage on levophed of 40 mcg/min, stated no to stay within normal titration parameters.

## 2021-09-14 NOTE — ED Notes (Signed)
[facility] notified at [phone] , donor r/o due to age, case [phone]

## 2021-09-14 NOTE — ED Notes (Addendum)
Dr. Sullivan at bedside

## 2021-09-14 NOTE — ED Notes (Signed)
BP 70/46, Milrinone held, and Dr. Irish Lack Messaged through secure message. Also on call number called and answering service for the cardiology group sent and message to the on call provider.

## 2021-09-14 NOTE — Significant Event (Signed)
I was paged to the patient's bedside at 2:19 am by his nurse for hypotension, worsening hypoxia and chest pain. I called the nurse back immediately and he stated that the patient's BP had been declining over the past ~30 minutes and was then 70s/40s. The patient also had worsening hypoxia and was maxed on 15L HFNC plus a NRB. I instructed the nurse to start the patient on levophed and I immediately went to evaluate the patient. On my arrival the patient was alert and oriented, but expressed having 8/10 central chest pain. This was recurrent since arriving to the ED the day prior. His exam was notable for HR 114, BP 67/38, RR 30, and was satting 95% on the aforementioned respiratory support. On exam he was A&Ox3 in moderate distress, tachycardic w/o M/R/G, tachypenic, had inspiratory and expiratory wheezing, and had cool extremities. I instructed the nurse to obtain an EKG which showed ST depressions in the inferior leads and V4-V6 which were roughly the same from prior. I was very concerned for cardiogenic shock from ACS for this patient so I called the on call interventionalist Dr. Peter Martinique for consideration of emergent cath. Given that the patient was in worsening hypoxic respiratory failure and had an AKI on advanced CKD, Dr. Martinique expressed concern that the patient would 1) need to be intubated prior to going to cath and 2) would likely end up on dialysis after undergoing cath. Given these concerns, I spoke with the patient regarding his options which included proceeding with intubation/LHC understanding the risks vs. Proceeding with more comfort measures realizing that it would result in his death. When faced with these options he firmly chose to pursue comfort and palliation and requested to be DNAR. With the patient's permission, I called his wife and eldest daughter and updated them on the patient's worsening clinical status. By this time the patient was maxed out on levophed. I proceeded to order 2 mg  IV dilaudid for the patient's comfort, but he had become unresponsive prior to it's administration. He ultimately died at 3:39 pm on 09-05-2021. I spoke with the patient's wife and eldest daughter once they arrived to the hospital and shared my condolences. A chaplain was offered but the family initially declined.

## 2021-09-14 DEATH — deceased

## 2022-07-04 IMAGING — MR MR LUMBAR SPINE W/O CM
4 of 5 series · 27 of 48 positions shown · non-contrast
Comparison: X-ray lumbar 09/23/2020

CLINICAL DATA: Lumbar radiculopathy. Patient complains of lower
back pain that radiates into both legs x 1 year.

EXAM:
MRI LUMBAR SPINE WITHOUT CONTRAST
TECHNIQUE: Multiplanar, multisequence MR imaging of the lumbar spine was
performed. No intravenous contrast was administered.

[Series 3: T2 · sagittal · 4.0mm · 1.09mm/px · 6 of 18 slices shown (1 of 2)]
[im 1/18]
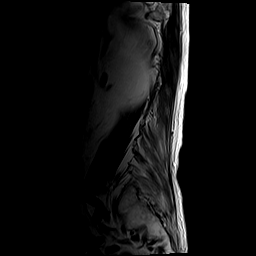
[im 4/18]
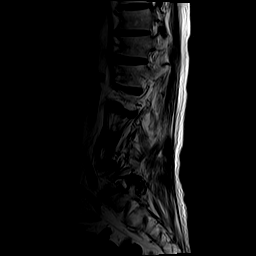
[im 7/18]
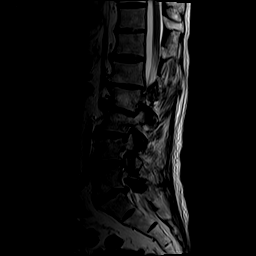
[im 11/18]
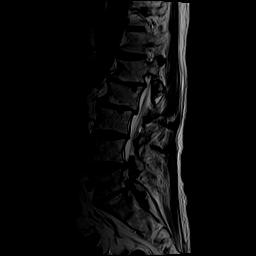
[im 14/18]
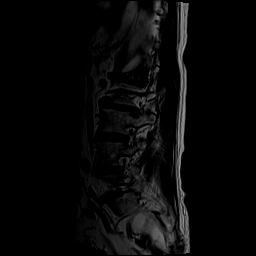
[im 18/18]
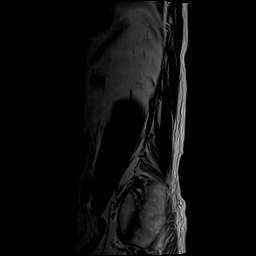

[Series 5: T1 · sagittal · 4.0mm · 1.09mm/px · 7 of 18 slices shown (1 of 2)]
[im 1/18]
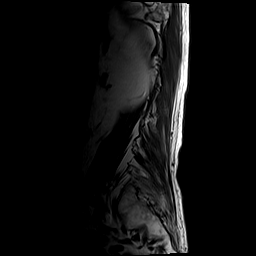
[im 3/18]
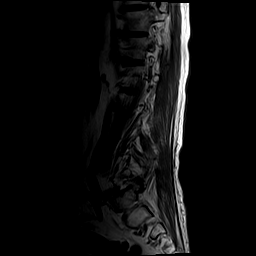
[im 6/18]
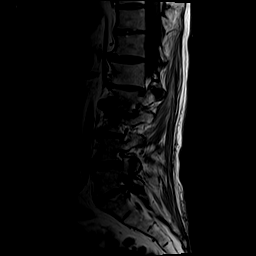
[im 9/18]
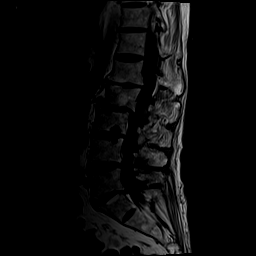
[im 12/18]
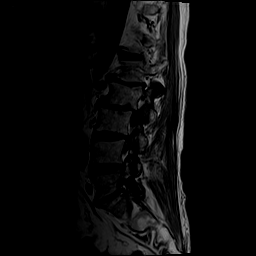
[im 15/18]
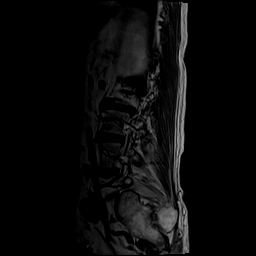
[im 18/18]
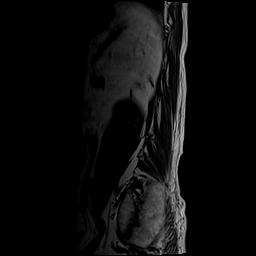

[Series 6: T2 · axial · 4.0mm · 0.39mm/px · z∈[-72,+128]mm · 8 of 39 slices shown (2 of 2)]
[im 1/39]
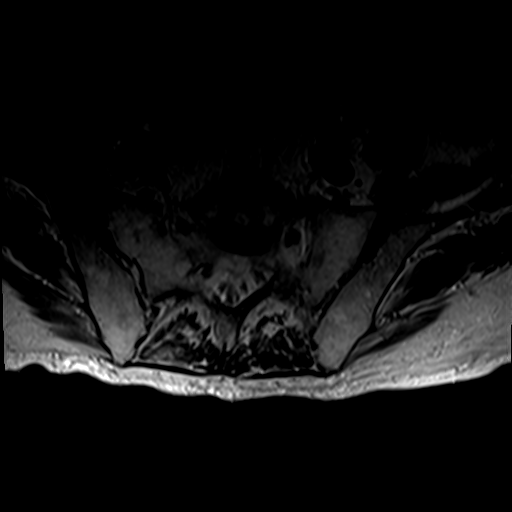
[im 6/39]
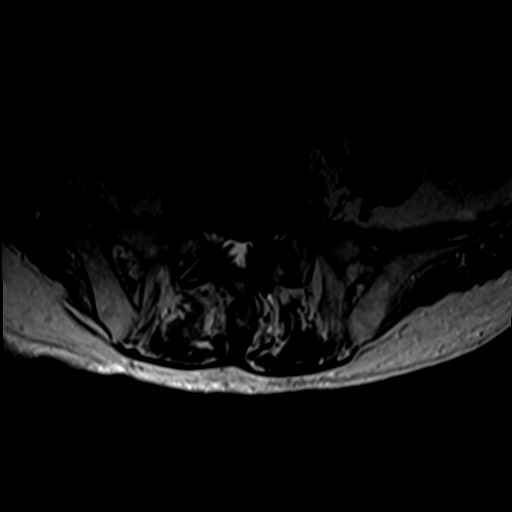
[im 12/39]
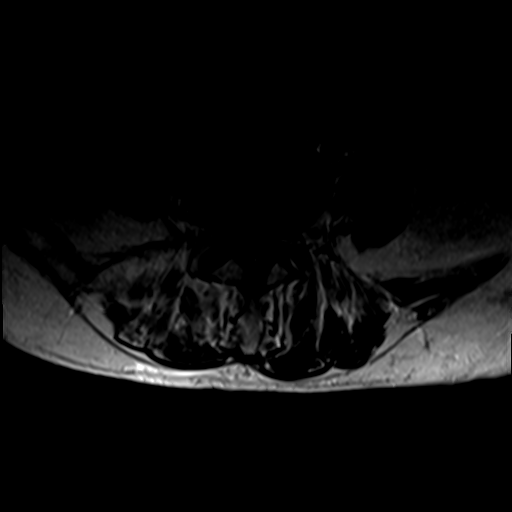
[im 18/39]
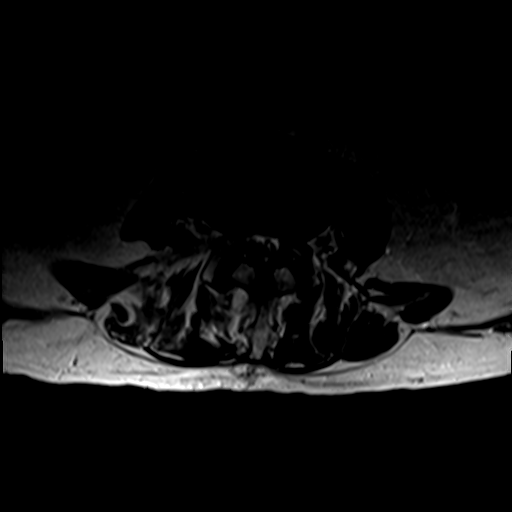
[im 21/39]
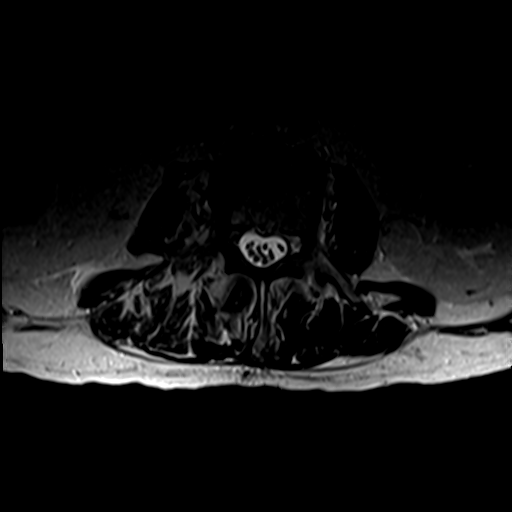
[im 27/39]
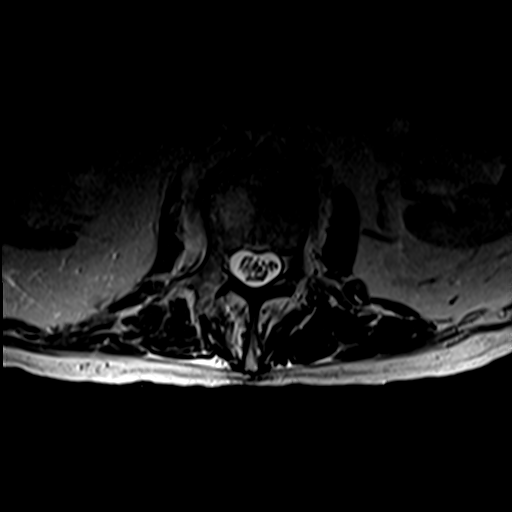
[im 33/39]
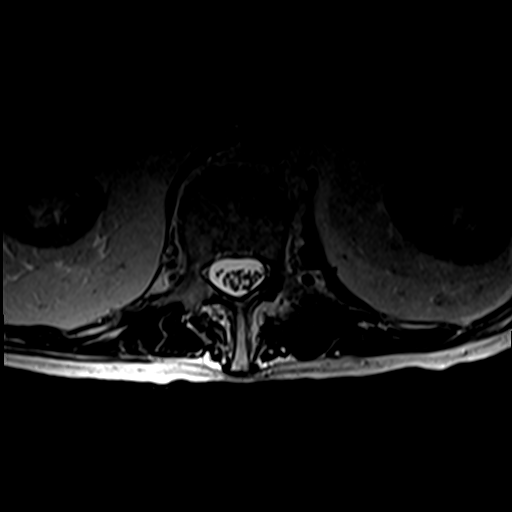
[im 39/39]
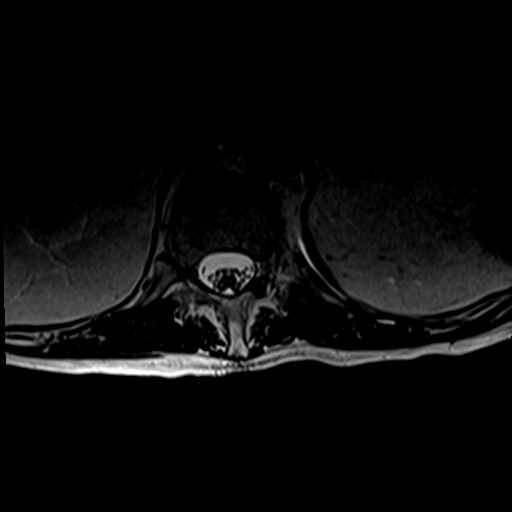

[Series 7: T1 · axial · 4.0mm · 0.39mm/px · z∈[-72,+99]mm · 6 of 39 slices shown (2 of 2)]
[im 1/39]
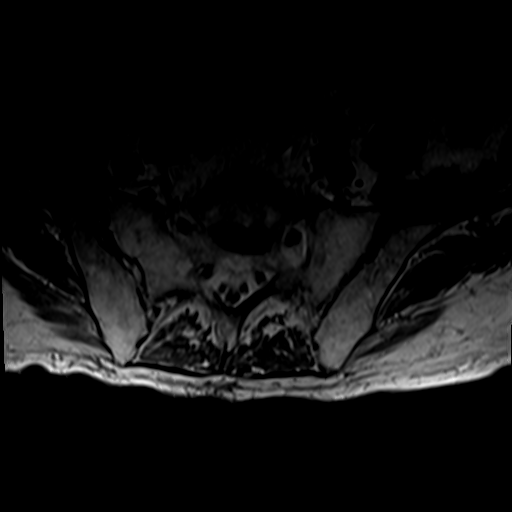
[im 6/39]
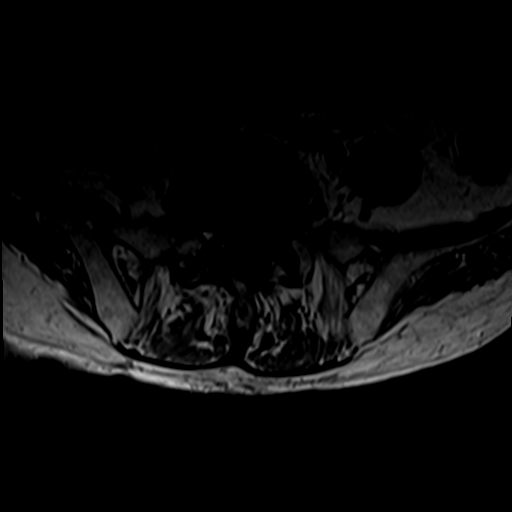
[im 12/39]
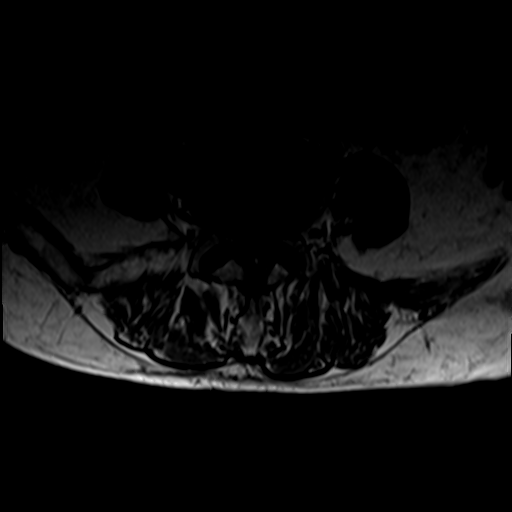
[im 18/39]
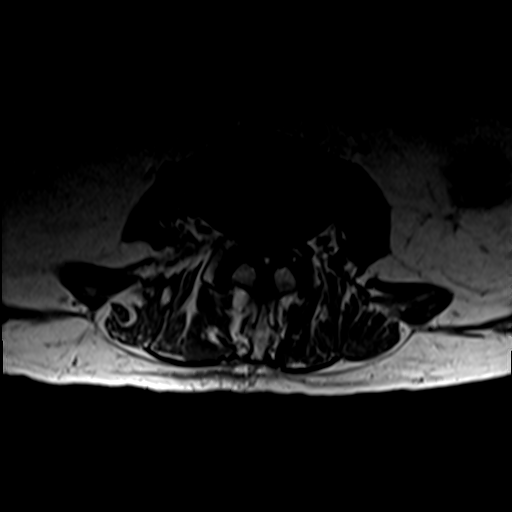
[im 21/39]
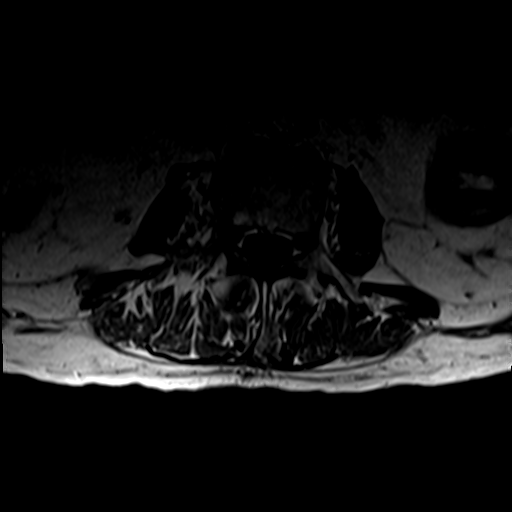
[im 33/39]
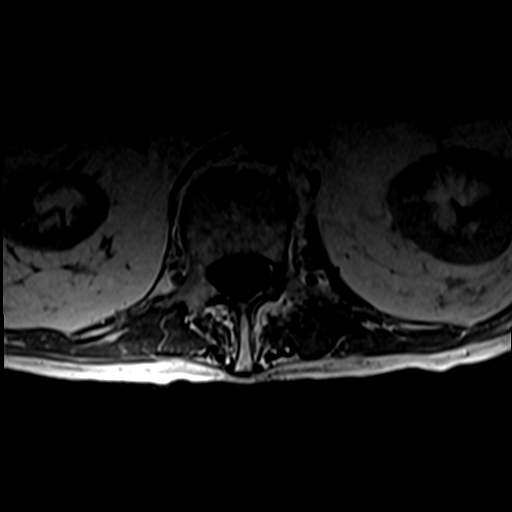

[27 of 48 positions shown; findings below may reference images not displayed]

FINDINGS: Segmentation:  Standard.

Alignment: Mild lumbar levoscoliosis. Grade 1 retrolisthesis of L2
on L3.

Vertebrae: No fracture or suspicious marrow lesion. Mild-to-moderate
degenerative endplate edema at L2-3 asymmetric to the right.
Scattered small Schmorl's nodes. Hemangioma in the L2 vertebral
body.

Conus medullaris and cauda equina: Conus extends to the T12-L1
level. Conus and cauda equina appear normal.

Paraspinal and other soft tissues: Small renal cysts. Asymmetric
right renal atrophy. Mild bilateral lumbar posterior paraspinal
muscle edema, nonspecific though could reflect strain. No fluid
collection.

Disc levels:

Disc desiccation throughout the lumbar and included lower thoracic
spine. Severe disc space narrowing at L1-2 and L2-3 with moderate
narrowing at L5-S1 and mild narrowing at L3-4 and L4-5.

T12-L1: Mild spondylosis and facet arthrosis without stenosis.

L1-2: Disc bulging, endplate spurring, and moderate facet and
ligamentum flavum hypertrophy without significant stenosis.

L2-3: Retrolisthesis with bulging uncovered disc, a right foraminal
to extraforaminal disc protrusion, endplate spurring, and moderate
facet and ligamentum flavum hypertrophy result in mild spinal
stenosis, moderate bilateral lateral recess stenosis, and severe
right and mild left neural foraminal stenosis with potential right
L2 and bilateral L3 nerve root impingement.

L3-4: Disc bulging, endplate spurring, and moderate facet and
ligamentum flavum hypertrophy result in moderate spinal stenosis,
moderate bilateral lateral recess stenosis, and moderate right and
mild left neural foraminal stenosis. Potential bilateral L4 nerve
root impingement.

L4-5: Disc bulging and moderate facet and ligamentum flavum
hypertrophy result in severe spinal and bilateral lateral recess
stenosis and mild bilateral neural foraminal stenosis. Bilateral
neural impingement is likely at this level, particularly of the L5
nerve roots.

L5-S1: Disc bulging, endplate spurring, and severe facet and
ligamentum flavum hypertrophy result in moderate spinal stenosis,
moderate left greater than right lateral recess stenosis, and
moderate left greater than right neural foraminal stenosis.
IMPRESSION: 1. Diffuse lumbar disc and facet degeneration, most notable at L4-5
where there is severe spinal stenosis.
2. Moderate spinal stenosis at L3-4 and L5-S1.
3. Severe right neural foraminal stenosis at L2-3.

## 2022-07-28 ENCOUNTER — Ambulatory Visit: Payer: Medicare Other
